# Patient Record
Sex: Male | Born: 1950
Health system: Southern US, Community
[De-identification: ages and names within clinical notes are randomized; demographics above are authoritative.]

## PROBLEM LIST (undated history)

## (undated) DIAGNOSIS — F419 Anxiety disorder, unspecified: Secondary | ICD-10-CM

## (undated) DIAGNOSIS — I10 Essential (primary) hypertension: Secondary | ICD-10-CM

## (undated) DIAGNOSIS — I2510 Atherosclerotic heart disease of native coronary artery without angina pectoris: Secondary | ICD-10-CM

## (undated) DIAGNOSIS — I639 Cerebral infarction, unspecified: Secondary | ICD-10-CM

## (undated) DIAGNOSIS — G4700 Insomnia, unspecified: Secondary | ICD-10-CM

## (undated) DIAGNOSIS — F32A Depression, unspecified: Secondary | ICD-10-CM

## (undated) DIAGNOSIS — F329 Major depressive disorder, single episode, unspecified: Secondary | ICD-10-CM

## (undated) DIAGNOSIS — N529 Male erectile dysfunction, unspecified: Secondary | ICD-10-CM

## (undated) DIAGNOSIS — K219 Gastro-esophageal reflux disease without esophagitis: Secondary | ICD-10-CM

## (undated) HISTORY — DX: Atherosclerotic heart disease of native coronary artery without angina pectoris: I25.10

## (undated) HISTORY — DX: Major depressive disorder, single episode, unspecified: F32.9

## (undated) HISTORY — DX: Depression, unspecified: F32.A

## (undated) HISTORY — DX: Male erectile dysfunction, unspecified: N52.9

## (undated) HISTORY — DX: Cerebral infarction, unspecified: I63.9

## (undated) HISTORY — DX: Essential (primary) hypertension: I10

## (undated) HISTORY — DX: Gastro-esophageal reflux disease without esophagitis: K21.9

## (undated) HISTORY — DX: Insomnia, unspecified: G47.00

## (undated) HISTORY — DX: Anxiety disorder, unspecified: F41.9

---

## 1993-12-29 HISTORY — PX: LUMBAR LAMINECTOMY: SHX95

## 1994-12-29 HISTORY — PX: CERVICAL LAMINECTOMY: SHX94

## 1999-12-30 HISTORY — PX: OTHER SURGICAL HISTORY: SHX169

## 2000-05-19 ENCOUNTER — Encounter: Payer: Self-pay | Admitting: Cardiology

## 2000-05-19 ENCOUNTER — Inpatient Hospital Stay (HOSPITAL_COMMUNITY): Admission: EM | Admit: 2000-05-19 | Discharge: 2000-05-25 | Payer: Self-pay | Admitting: *Deleted

## 2000-05-22 ENCOUNTER — Encounter: Payer: Self-pay | Admitting: Thoracic Surgery (Cardiothoracic Vascular Surgery)

## 2000-05-23 ENCOUNTER — Encounter: Payer: Self-pay | Admitting: Thoracic Surgery (Cardiothoracic Vascular Surgery)

## 2000-05-24 ENCOUNTER — Encounter: Payer: Self-pay | Admitting: Thoracic Surgery (Cardiothoracic Vascular Surgery)

## 2000-11-25 ENCOUNTER — Ambulatory Visit (HOSPITAL_COMMUNITY): Admission: RE | Admit: 2000-11-25 | Discharge: 2000-11-25 | Payer: Self-pay | Admitting: Orthopedic Surgery

## 2000-11-25 ENCOUNTER — Encounter: Payer: Self-pay | Admitting: Orthopedic Surgery

## 2000-12-16 ENCOUNTER — Encounter: Payer: Self-pay | Admitting: Orthopedic Surgery

## 2000-12-16 ENCOUNTER — Ambulatory Visit (HOSPITAL_COMMUNITY): Admission: RE | Admit: 2000-12-16 | Discharge: 2000-12-16 | Payer: Self-pay | Admitting: Orthopedic Surgery

## 2001-07-26 ENCOUNTER — Emergency Department (HOSPITAL_COMMUNITY): Admission: EM | Admit: 2001-07-26 | Discharge: 2001-07-26 | Payer: Self-pay | Admitting: Emergency Medicine

## 2001-07-26 ENCOUNTER — Encounter: Payer: Self-pay | Admitting: Emergency Medicine

## 2004-09-06 ENCOUNTER — Ambulatory Visit: Payer: Self-pay | Admitting: Internal Medicine

## 2004-09-10 ENCOUNTER — Ambulatory Visit: Payer: Self-pay | Admitting: Internal Medicine

## 2004-09-17 ENCOUNTER — Ambulatory Visit: Payer: Self-pay | Admitting: Internal Medicine

## 2004-09-20 ENCOUNTER — Ambulatory Visit: Payer: Self-pay | Admitting: Internal Medicine

## 2004-10-01 ENCOUNTER — Ambulatory Visit: Payer: Self-pay | Admitting: *Deleted

## 2004-10-01 ENCOUNTER — Ambulatory Visit: Payer: Self-pay | Admitting: Internal Medicine

## 2004-10-04 ENCOUNTER — Ambulatory Visit: Payer: Self-pay | Admitting: Internal Medicine

## 2004-10-09 ENCOUNTER — Ambulatory Visit: Payer: Self-pay | Admitting: Internal Medicine

## 2004-10-17 ENCOUNTER — Ambulatory Visit: Payer: Self-pay | Admitting: Internal Medicine

## 2004-10-28 ENCOUNTER — Ambulatory Visit: Payer: Self-pay | Admitting: Internal Medicine

## 2004-11-04 ENCOUNTER — Ambulatory Visit: Payer: Self-pay | Admitting: Internal Medicine

## 2004-11-11 ENCOUNTER — Ambulatory Visit: Payer: Self-pay | Admitting: Internal Medicine

## 2004-12-02 ENCOUNTER — Ambulatory Visit: Payer: Self-pay | Admitting: Internal Medicine

## 2004-12-16 ENCOUNTER — Ambulatory Visit: Payer: Self-pay | Admitting: Internal Medicine

## 2004-12-17 ENCOUNTER — Ambulatory Visit: Payer: Self-pay | Admitting: Internal Medicine

## 2004-12-25 ENCOUNTER — Ambulatory Visit: Payer: Self-pay | Admitting: Internal Medicine

## 2005-01-15 ENCOUNTER — Ambulatory Visit: Payer: Self-pay | Admitting: Internal Medicine

## 2005-01-29 ENCOUNTER — Ambulatory Visit: Payer: Self-pay | Admitting: Internal Medicine

## 2005-02-04 ENCOUNTER — Ambulatory Visit: Payer: Self-pay | Admitting: Internal Medicine

## 2005-03-19 ENCOUNTER — Ambulatory Visit: Payer: Self-pay | Admitting: Internal Medicine

## 2005-04-18 ENCOUNTER — Ambulatory Visit: Payer: Self-pay | Admitting: Internal Medicine

## 2005-07-18 ENCOUNTER — Ambulatory Visit: Payer: Self-pay | Admitting: Internal Medicine

## 2005-08-13 ENCOUNTER — Ambulatory Visit: Payer: Self-pay | Admitting: Family Medicine

## 2007-01-25 ENCOUNTER — Ambulatory Visit: Payer: Self-pay | Admitting: Family Medicine

## 2007-08-18 ENCOUNTER — Telehealth: Payer: Self-pay | Admitting: Family Medicine

## 2007-10-12 ENCOUNTER — Telehealth: Payer: Self-pay | Admitting: Family Medicine

## 2008-04-26 ENCOUNTER — Telehealth: Payer: Self-pay | Admitting: Family Medicine

## 2008-06-25 ENCOUNTER — Inpatient Hospital Stay (HOSPITAL_COMMUNITY): Admission: EM | Admit: 2008-06-25 | Discharge: 2008-06-28 | Payer: Self-pay | Admitting: Emergency Medicine

## 2008-06-25 ENCOUNTER — Ambulatory Visit: Payer: Self-pay | Admitting: Cardiology

## 2008-06-26 ENCOUNTER — Encounter: Payer: Self-pay | Admitting: Internal Medicine

## 2008-06-26 ENCOUNTER — Ambulatory Visit: Payer: Self-pay | Admitting: *Deleted

## 2008-06-27 ENCOUNTER — Ambulatory Visit: Payer: Self-pay | Admitting: Internal Medicine

## 2008-06-27 ENCOUNTER — Ambulatory Visit: Payer: Self-pay | Admitting: Physical Medicine & Rehabilitation

## 2008-06-28 ENCOUNTER — Inpatient Hospital Stay (HOSPITAL_COMMUNITY)
Admission: RE | Admit: 2008-06-28 | Discharge: 2008-07-07 | Payer: Self-pay | Admitting: Physical Medicine & Rehabilitation

## 2008-06-28 ENCOUNTER — Ambulatory Visit: Payer: Self-pay | Admitting: Physical Medicine & Rehabilitation

## 2008-07-12 ENCOUNTER — Encounter
Admission: RE | Admit: 2008-07-12 | Discharge: 2008-07-28 | Payer: Self-pay | Admitting: Physical Medicine & Rehabilitation

## 2008-07-18 ENCOUNTER — Telehealth: Payer: Self-pay | Admitting: Family Medicine

## 2008-07-26 ENCOUNTER — Encounter
Admission: RE | Admit: 2008-07-26 | Discharge: 2008-07-26 | Payer: Self-pay | Admitting: Physical Medicine & Rehabilitation

## 2008-07-26 ENCOUNTER — Ambulatory Visit: Payer: Self-pay | Admitting: Physical Medicine & Rehabilitation

## 2008-08-03 ENCOUNTER — Telehealth: Payer: Self-pay | Admitting: Family Medicine

## 2008-08-11 ENCOUNTER — Telehealth: Payer: Self-pay | Admitting: Family Medicine

## 2008-08-11 ENCOUNTER — Ambulatory Visit: Payer: Self-pay | Admitting: Family Medicine

## 2008-08-11 DIAGNOSIS — I252 Old myocardial infarction: Secondary | ICD-10-CM | POA: Insufficient documentation

## 2008-08-11 DIAGNOSIS — F411 Generalized anxiety disorder: Secondary | ICD-10-CM | POA: Insufficient documentation

## 2008-08-11 DIAGNOSIS — Z8679 Personal history of other diseases of the circulatory system: Secondary | ICD-10-CM | POA: Insufficient documentation

## 2008-08-11 DIAGNOSIS — K219 Gastro-esophageal reflux disease without esophagitis: Secondary | ICD-10-CM | POA: Insufficient documentation

## 2008-08-11 DIAGNOSIS — I2510 Atherosclerotic heart disease of native coronary artery without angina pectoris: Secondary | ICD-10-CM | POA: Insufficient documentation

## 2008-08-11 DIAGNOSIS — F329 Major depressive disorder, single episode, unspecified: Secondary | ICD-10-CM | POA: Insufficient documentation

## 2008-08-11 DIAGNOSIS — F17200 Nicotine dependence, unspecified, uncomplicated: Secondary | ICD-10-CM | POA: Insufficient documentation

## 2008-08-11 DIAGNOSIS — I10 Essential (primary) hypertension: Secondary | ICD-10-CM | POA: Insufficient documentation

## 2008-10-30 ENCOUNTER — Telehealth: Payer: Self-pay | Admitting: Family Medicine

## 2009-05-11 ENCOUNTER — Ambulatory Visit: Payer: Self-pay | Admitting: Family Medicine

## 2009-05-11 DIAGNOSIS — M545 Low back pain, unspecified: Secondary | ICD-10-CM | POA: Insufficient documentation

## 2009-06-04 ENCOUNTER — Telehealth: Payer: Self-pay | Admitting: Family Medicine

## 2009-06-13 ENCOUNTER — Telehealth: Payer: Self-pay | Admitting: Family Medicine

## 2009-07-13 ENCOUNTER — Telehealth: Payer: Self-pay | Admitting: Family Medicine

## 2009-08-17 ENCOUNTER — Telehealth: Payer: Self-pay | Admitting: Family Medicine

## 2009-10-08 ENCOUNTER — Telehealth: Payer: Self-pay | Admitting: Family Medicine

## 2009-10-10 ENCOUNTER — Telehealth: Payer: Self-pay | Admitting: Family Medicine

## 2009-10-10 ENCOUNTER — Ambulatory Visit: Payer: Self-pay | Admitting: Family Medicine

## 2009-10-10 DIAGNOSIS — E785 Hyperlipidemia, unspecified: Secondary | ICD-10-CM | POA: Insufficient documentation

## 2009-10-11 ENCOUNTER — Telehealth: Payer: Self-pay | Admitting: Family Medicine

## 2009-10-15 LAB — CONVERTED CEMR LAB
ALT: 20 units/L (ref 0–53)
AST: 27 units/L (ref 0–37)
Albumin: 4.6 g/dL (ref 3.5–5.2)
Alkaline Phosphatase: 49 units/L (ref 39–117)
BUN: 15 mg/dL (ref 6–23)
Basophils Absolute: 0 10*3/uL (ref 0.0–0.1)
Basophils Relative: 0.4 % (ref 0.0–3.0)
Bilirubin, Direct: 0.1 mg/dL (ref 0.0–0.3)
CO2: 31 meq/L (ref 19–32)
Calcium: 9.8 mg/dL (ref 8.4–10.5)
Chloride: 102 meq/L (ref 96–112)
Cholesterol: 165 mg/dL (ref 0–200)
Creatinine, Ser: 1.1 mg/dL (ref 0.4–1.5)
Eosinophils Absolute: 0.2 10*3/uL (ref 0.0–0.7)
Eosinophils Relative: 2.5 % (ref 0.0–5.0)
GFR calc non Af Amer: 72.42 mL/min (ref >60)
Glucose, Bld: 116 mg/dL — ABNORMAL HIGH (ref 70–99)
HCT: 44 % (ref 39.0–52.0)
HDL: 28.6 mg/dL — ABNORMAL LOW (ref >39.00)
Hemoglobin: 15.4 g/dL (ref 13.0–17.0)
LDL Cholesterol: 101 mg/dL — ABNORMAL HIGH (ref 0–99)
Lymphocytes Relative: 30.3 % (ref 12.0–46.0)
Lymphs Abs: 2.7 10*3/uL (ref 0.7–4.0)
MCHC: 35 g/dL (ref 30.0–36.0)
MCV: 93.5 fL (ref 78.0–100.0)
Monocytes Absolute: 0.8 10*3/uL (ref 0.1–1.0)
Monocytes Relative: 9.1 % (ref 3.0–12.0)
Neutro Abs: 5.1 10*3/uL (ref 1.4–7.7)
Neutrophils Relative %: 57.7 % (ref 43.0–77.0)
Platelets: 310 10*3/uL (ref 150.0–400.0)
Potassium: 4.4 meq/L (ref 3.5–5.1)
RBC: 4.7 M/uL (ref 4.22–5.81)
RDW: 12.2 % (ref 11.5–14.6)
Sodium: 148 meq/L — ABNORMAL HIGH (ref 135–145)
TSH: 1.01 microintl units/mL (ref 0.35–5.50)
Total Bilirubin: 0.6 mg/dL (ref 0.3–1.2)
Total CHOL/HDL Ratio: 6
Total Protein: 7.5 g/dL (ref 6.0–8.3)
Triglycerides: 179 mg/dL — ABNORMAL HIGH (ref 0.0–149.0)
VLDL: 35.8 mg/dL (ref 0.0–40.0)
WBC: 8.8 10*3/uL (ref 4.5–10.5)

## 2009-11-13 ENCOUNTER — Telehealth: Payer: Self-pay | Admitting: Family Medicine

## 2009-12-14 ENCOUNTER — Telehealth: Payer: Self-pay | Admitting: Family Medicine

## 2009-12-31 ENCOUNTER — Telehealth: Payer: Self-pay | Admitting: Family Medicine

## 2010-01-15 ENCOUNTER — Telehealth: Payer: Self-pay | Admitting: Family Medicine

## 2010-02-26 ENCOUNTER — Telehealth: Payer: Self-pay | Admitting: Family Medicine

## 2010-04-10 ENCOUNTER — Telehealth: Payer: Self-pay | Admitting: Family Medicine

## 2010-05-17 ENCOUNTER — Telehealth: Payer: Self-pay | Admitting: Family Medicine

## 2010-06-18 ENCOUNTER — Telehealth: Payer: Self-pay | Admitting: Family Medicine

## 2010-08-19 ENCOUNTER — Ambulatory Visit: Payer: Self-pay | Admitting: Family Medicine

## 2010-08-19 DIAGNOSIS — R1084 Generalized abdominal pain: Secondary | ICD-10-CM

## 2010-08-19 DIAGNOSIS — R3129 Other microscopic hematuria: Secondary | ICD-10-CM | POA: Insufficient documentation

## 2010-08-19 DIAGNOSIS — K5900 Constipation, unspecified: Secondary | ICD-10-CM | POA: Insufficient documentation

## 2010-08-19 DIAGNOSIS — R634 Abnormal weight loss: Secondary | ICD-10-CM | POA: Insufficient documentation

## 2010-08-21 ENCOUNTER — Ambulatory Visit: Payer: Self-pay | Admitting: Internal Medicine

## 2010-08-21 LAB — CONVERTED CEMR LAB
ALT: 19 units/L (ref 0–53)
AST: 22 units/L (ref 0–37)
Albumin: 4.4 g/dL (ref 3.5–5.2)
Alkaline Phosphatase: 61 units/L (ref 39–117)
Amylase: 41 units/L (ref 27–131)
BUN: 16 mg/dL (ref 6–23)
Basophils Absolute: 0.1 10*3/uL (ref 0.0–0.1)
Basophils Relative: 0.7 % (ref 0.0–3.0)
Bilirubin, Direct: 0.1 mg/dL (ref 0.0–0.3)
CEA: 2 ng/mL (ref 0.0–5.0)
CO2: 33 meq/L — ABNORMAL HIGH (ref 19–32)
Calcium: 9.8 mg/dL (ref 8.4–10.5)
Chloride: 96 meq/L (ref 96–112)
Creatinine, Ser: 1.3 mg/dL (ref 0.4–1.5)
Eosinophils Absolute: 0.3 10*3/uL (ref 0.0–0.7)
Eosinophils Relative: 2.7 % (ref 0.0–5.0)
GFR calc non Af Amer: 60.63 mL/min (ref >60)
Glucose, Bld: 125 mg/dL — ABNORMAL HIGH (ref 70–99)
HCT: 44.2 % (ref 39.0–52.0)
Hemoglobin: 15.3 g/dL (ref 13.0–17.0)
Lymphocytes Relative: 28 % (ref 12.0–46.0)
Lymphs Abs: 3 10*3/uL (ref 0.7–4.0)
MCHC: 34.7 g/dL (ref 30.0–36.0)
MCV: 91.8 fL (ref 78.0–100.0)
Monocytes Absolute: 0.8 10*3/uL (ref 0.1–1.0)
Monocytes Relative: 8 % (ref 3.0–12.0)
Neutro Abs: 6.4 10*3/uL (ref 1.4–7.7)
Neutrophils Relative %: 60.6 % (ref 43.0–77.0)
PSA: 0.8 ng/mL (ref 0.10–4.00)
Platelets: 362 10*3/uL (ref 150.0–400.0)
Potassium: 4.1 meq/L (ref 3.5–5.1)
RBC: 4.82 M/uL (ref 4.22–5.81)
RDW: 13.4 % (ref 11.5–14.6)
Sodium: 139 meq/L (ref 135–145)
TSH: 1.02 microintl units/mL (ref 0.35–5.50)
Total Bilirubin: 0.6 mg/dL (ref 0.3–1.2)
Total Protein: 7.2 g/dL (ref 6.0–8.3)
WBC: 10.6 10*3/uL — ABNORMAL HIGH (ref 4.5–10.5)

## 2010-08-22 ENCOUNTER — Encounter: Payer: Self-pay | Admitting: Physician Assistant

## 2010-08-22 ENCOUNTER — Encounter: Payer: Self-pay | Admitting: Family Medicine

## 2010-08-22 ENCOUNTER — Ambulatory Visit: Payer: Self-pay | Admitting: Internal Medicine

## 2010-08-22 ENCOUNTER — Telehealth (INDEPENDENT_AMBULATORY_CARE_PROVIDER_SITE_OTHER): Payer: Self-pay | Admitting: *Deleted

## 2010-08-22 DIAGNOSIS — I6350 Cerebral infarction due to unspecified occlusion or stenosis of unspecified cerebral artery: Secondary | ICD-10-CM | POA: Insufficient documentation

## 2010-08-22 DIAGNOSIS — R933 Abnormal findings on diagnostic imaging of other parts of digestive tract: Secondary | ICD-10-CM

## 2010-09-04 ENCOUNTER — Ambulatory Visit (HOSPITAL_COMMUNITY): Admission: RE | Admit: 2010-09-04 | Discharge: 2010-09-04 | Payer: Self-pay | Admitting: Internal Medicine

## 2010-09-04 ENCOUNTER — Ambulatory Visit: Payer: Self-pay | Admitting: Internal Medicine

## 2010-09-04 LAB — HM COLONOSCOPY

## 2010-09-09 ENCOUNTER — Encounter: Payer: Self-pay | Admitting: Internal Medicine

## 2011-01-28 NOTE — Progress Notes (Signed)
Summary: refill xanax  Phone Note From Pharmacy   Caller: [facility] [address] [phone]* Call For: fry  Summary of Call: refill xanax 0.5mg  1 by mouth 4 times a day Initial call taken by: Alfred Levins, CMA,  October 30, 2008 7:59 AM  Follow-up for Phone Call        call in #120 with 5 rf Follow-up by: Nelwyn Salisbury MD,  October 30, 2008 1:15 PM  Additional Follow-up for Phone Call Additional follow up Details #1::        Phone call completed, Pharmacist called Additional Follow-up by: Alfred Levins, CMA,  October 31, 2008 8:40 AM    New/Updated Medications: ALPRAZOLAM 0.5 MG  TB24 (ALPRAZOLAM) 1 every 6 hours   Prescriptions: ALPRAZOLAM 0.5 MG  TB24 (ALPRAZOLAM) 1 every 6 hours  #120 x 5   Entered by:   Alfred Levins, CMA   Authorized by:   Nelwyn Salisbury MD   Signed by:   Alfred Levins, CMA on 10/31/2008   Method used:   Telephoned to ...       [facility] [address] [address] [phone]* (retail)       [address]       [place], [place]  [address]       Ph: [phone]       Fax: [phone]   RxID:   [id number]

## 2011-01-28 NOTE — Assessment & Plan Note (Signed)
Summary: personal/njr   Vital Signs:  Patient profile:   60 year old male Weight:      175 pounds BMI:     23.82 BP sitting:   150 / 92  (left arm) Cuff size:   regular  Vitals Entered By: Raechel Ache, RN (August 19, 2010 1:24 PM) CC: C/o terrible constipation.   History of Present Illness: Here complaining of severe constipation that started rather suddenly 3 weeks ago. He never had trouble with this before, andhe usually had a BM every day. Then 3 weeks ago he developed intermittent lower abdominal pains and it became dificult to have a BM. Now he is having only one or 2 BMs a week, and these are difficult to pass. The stools are small and hard, and he has to strain quite a bit to pass them. He still has intermittent pains in both lower quadrants. No bllod in the stools. No fever. No nausea or vomitting. His appetite is down somewhat, and he has lost 8 lbs since he was here last October. He has never had a colonoscopy despite my advising him to do so. He urinates easily. He stopped using Percocets over a month ago, so I cannot chalk this up to medication side effects.   Allergies: No Known Drug Allergies  Past History:  Past Medical History: Reviewed history from 08/11/2008 and no changes required. Anxiety insomnia Hypertension Cerebrovascular accident, hx of, on 06-17-08 GERD ED Depression, saw Dr. Donell Beers in the past Coronary artery disease Myocardial infarction, hx of, 2001  Past Surgical History: Reviewed history from 05/11/2009 and no changes required. Lumbar laminectomy 1995, per Dr. Darrelyn Hillock Cervical laminectomy 1996, per Dr. Gerlene Fee  Family History: Reviewed history from 08/11/2008 and no changes required. Family History of CAD Male 1st degree relative <50 Family History Hypertension  Review of Systems  The patient denies anorexia, fever, weight gain, vision loss, decreased hearing, hoarseness, chest pain, syncope, dyspnea on exertion, peripheral edema,  prolonged cough, headaches, hemoptysis, melena, hematochezia, severe indigestion/heartburn, hematuria, incontinence, genital sores, muscle weakness, suspicious skin lesions, transient blindness, difficulty walking, depression, unusual weight change, abnormal bleeding, enlarged lymph nodes, angioedema, breast masses, and testicular masses.    Physical Exam  General:  alert, thin, NAD Lungs:  Normal respiratory effort, chest expands symmetrically. Lungs are clear to auscultation, no crackles or wheezes. Heart:  Normal rate and regular rhythm. S1 and S2 normal without gallop, murmur, click, rub or other extra sounds. Abdomen:  soft, normal bowel sounds, no distention, no masses, no guarding, no rigidity, no rebound tenderness, no abdominal hernia, no inguinal hernia, no hepatomegaly, and no splenomegaly.  Mildly tender in both lower quadrants Rectal:  no external abnormalities, no hemorrhoids, normal sphincter tone, and no masses.  The rectum is full of soft heme negative brown stool Prostate:  Prostate gland firm and smooth, no enlargement, nodularity, tenderness, mass, asymmetry or induration.   Impression & Recommendations:  Problem # 1:  ABDOMINAL PAIN, GENERALIZED (ICD-789.07)  Orders: UA Dipstick w/o Micro (automated)  (81003) Venipuncture ([address]) TLB-BMP (Basic Metabolic Panel-BMET) (80048-METABOL) TLB-CBC Platelet - w/Differential (85025-CBCD) TLB-Hepatic/Liver Function Pnl (80076-HEPATIC) TLB-TSH (Thyroid Stimulating Hormone) (84443-TSH) TLB-Amylase (82150-AMYL) TLB-CEA (Carcinoembryonic Antigen) (82378-CEA) TLB-PSA (Prostate Specific Antigen) (60454-UJW) Radiology Referral (Radiology)  Complete Medication List: 1)  Alprazolam 0.5 Mg Tb24 (Alprazolam) .Marland Kitchen.. 1 every 6 hours 2)  Zocor 40 Mg Tabs (Simvastatin) .Marland Kitchen.. 1 at bedtime 3)  Lopressor 50 Mg Tabs (Metoprolol tartrate) .Marland Kitchen.. 1 1/2 two times a day 4)  Hydrochlorothiazide 25 Mg  Tabs (Hydrochlorothiazide) .Marland Kitchen.. 1 once daily 5)   Fenofibrate 160 Mg Tabs (Fenofibrate) .Marland Kitchen.. 1 by mouth once daily 6)  Plavix 75 Mg Tabs (Clopidogrel bisulfate) .Marland Kitchen.. 1 once daily 7)  Bayer Low Strength 81 Mg Tbec (Aspirin) .... Once daily 8)  Percocet 10-650 Mg Tabs (Oxycodone-acetaminophen) .Marland Kitchen.. 1 q 6 hours as needed pain  Patient Instructions: 1)  Suggested he try some Fleet enemas with mineral oil tonight. Get labs. Set up a contrasted CT of abdomen and pelvis. He will need a colonoscopy some time soon as well.   Appended Document: personal/njr  Laboratory Results   Urine Tests    Routine Urinalysis   Color: yellow Appearance: Clear Glucose: negative   (Normal Range: Negative) Bilirubin: negative   (Normal Range: Negative) Ketone: negative   (Normal Range: Negative) Spec. Gravity: 1.020   (Normal Range: 1.003-1.035) Blood: 1+   (Normal Range: Negative) pH: 7.0   (Normal Range: 5.0-8.0) Protein: 2+   (Normal Range: Negative) Urobilinogen: 0.2   (Normal Range: 0-1) Nitrite: negative   (Normal Range: Negative) Leukocyte Esterace: negative   (Normal Range: Negative)    Comments: Rita Ohara  August 19, 2010 3:10 PM      Appended Document: personal/njr his urine has a little blood in it. refer to Urology for microscopic hematuria   Appended Document: personal/njr called.   Clinical Lists Changes  Problems: Added new problem of MICROSCOPIC HEMATURIA (ICD-599.72) Orders: Added new Referral order of Urology Referral (Urology) - Signed

## 2011-01-28 NOTE — Letter (Signed)
Summary: Anticoagulation Modification Letter  [facility]  [address] [place], [place] [address]   Phone: [phone]  Fax: [phone]            August 22, 2010  Re:    Wayne Williams DOB:    22-Feb-1951 MRN:    [id number]    Dear Dr Claris Che:  We have scheduled the above patient for an endoscopic procedure. Our records show that  he is on anticoagulation therapy. Please advise as to how long the patient may come off their therapy of Plavix prior to the scheduled procedure(s) on 09/04/10.   Please fax back/or route the completed form to [employer] at [phone]. Thank you for your help with this matter.  Sincerely,   Dottie Nelson-Smith CMA (AAMA)   Physician Recommendation:  Hold Plavix 7 days prior ________________   Other ______________________________     Appended Document: Anticoagulation Modification Letter okay to hold Plavix for 7 days prior to procedure  Appended Document: Anticoagulation Modification Letter Patient advised okay per Dr Clent Ridges that it is okay to discontinue plavix 7 days prior to test. Patient verbalizes understanding.

## 2011-01-28 NOTE — Progress Notes (Signed)
Summary: refill xanax  Phone Note Refill Request Message from:  Pharmacy on December 31, 2009 11:55 AM  Refills Requested: Medication #1:  ALPRAZOLAM 0.5 MG  TB24 1 every 6 hours [facility]   Method Requested: Electronic Initial call taken by: Alfred Levins, CMA,  December 31, 2009 11:55 AM  Follow-up for Phone Call        call in #120 with 5 rf Follow-up by: Nelwyn Salisbury MD,  January 01, 2010 8:13 AM  Additional Follow-up for Phone Call Additional follow up Details #1::        Rx called to pharmacy Additional Follow-up by: Alfred Levins, CMA,  January 01, 2010 10:38 AM    Prescriptions: ALPRAZOLAM 0.5 MG  TB24 (ALPRAZOLAM) 1 every 6 hours  #120 x 5   Entered by:   Alfred Levins, CMA   Authorized by:   Nelwyn Salisbury MD   Signed by:   Alfred Levins, CMA on 01/01/2010   Method used:   Telephoned to ...       [facility] [address] (retail)       [phone]       [place], [place]  [address]       Ph: [phone]       Fax: [phone]   RxID:   [id number]

## 2011-01-28 NOTE — Miscellaneous (Signed)
Summary: Orders Update   Clinical Lists Changes  Orders: Added new Referral order of Gastroenterology Referral (GI) - Signed 

## 2011-01-28 NOTE — Progress Notes (Signed)
Summary: percocet  Phone Note Call from Patient Call back at [phone]   Caller: live Call For: fry Summary of Call: Requesting refill Percocet.  Pain continues butt cheek & behind knee.  Still using 1 -2 daily.  Prednisone helped & getting better, but would like to have the Percocet to take.  [facility] [address].   Initial call taken by: Rudy Jew, RN,  June 13, 2009 2:57 PM  Follow-up for Phone Call        done Follow-up by: Nelwyn Salisbury MD,  June 13, 2009 3:25 PM  Additional Follow-up for Phone Call Additional follow up Details #1::        rx up front, pt aware Additional Follow-up by: Alfred Levins, CMA,  June 13, 2009 3:32 PM      Prescriptions: PERCOCET 10-650 MG TABS (OXYCODONE-ACETAMINOPHEN) 1 q 6 hours as needed pain  #60 x 0   Entered and Authorized by:   Nelwyn Salisbury MD   Signed by:   Nelwyn Salisbury MD on 06/13/2009   Method used:   Print then Give to Patient   RxID:   [id number]

## 2011-01-28 NOTE — Progress Notes (Signed)
Summary: refill xanax  Phone Note Call from Patient Call back at Home Phone [phone]   Caller: Patient Call For: fry Summary of Call: [facility]anax [facility] [address] Initial call taken by: Alfred Levins, CMA,  April 26, 2008 9:44 AM  Follow-up for Phone Call        call in 0.5mg  4 times a day, #120 with 5 rf Follow-up by: Nelwyn Salisbury MD,  April 27, 2008 10:21 AM  Additional Follow-up for Phone Call Additional follow up Details #1::        Phone Call Completed, Rx Called In Additional Follow-up by: Alfred Levins, CMA,  April 27, 2008 11:27 AM      Prescriptions: ALPRAZOLAM 0.5 MG  TB24 (ALPRAZOLAM) #120  #120 x 5   Entered by:   Alfred Levins, CMA   Authorized by:   Nelwyn Salisbury MD   Signed by:   Alfred Levins, CMA on 04/27/2008   Method used:   Telephoned to ...       [facility] [address] [phone]*       [address]       [place], [place]  [address]       Ph: [phone]       Fax: [phone]   RxID:   [id number]

## 2011-01-28 NOTE — Progress Notes (Signed)
Summary: refill percocet  Phone Note Call from Patient Call back at Home Phone [phone]   Caller: Patient Call For: fry Reason for Call: Refill Medication Summary of Call: refill percocet call when ready for p/u Initial call taken by: Alfred Levins, CMA,  November 13, 2009 1:58 PM  Follow-up for Phone Call        done Follow-up by: Nelwyn Salisbury MD,  November 13, 2009 4:59 PM  Additional Follow-up for Phone Call Additional follow up Details #1::        rx up front ready for p/u, pt aware Additional Follow-up by: Alfred Levins, CMA,  November 13, 2009 5:08 PM    Prescriptions: PERCOCET 10-650 MG TABS (OXYCODONE-ACETAMINOPHEN) 1 q 6 hours as needed pain  #120 x 0   Entered and Authorized by:   Nelwyn Salisbury MD   Signed by:   Nelwyn Salisbury MD on 11/13/2009   Method used:   Print then Give to Patient   RxID:   [id number]

## 2011-01-28 NOTE — Procedures (Signed)
Summary: Colonoscopy  Patient: Aseel Uhde Note: All result statuses are Final unless otherwise noted.  Tests: (1) Colonoscopy (COL)   COL Colonoscopy           DONE (C)     [facility]     [address] [place], [place]  [address]           COLONOSCOPY PROCEDURE REPORT           PATIENT:  Devun, Anna  MR#:  [id number]     BIRTHDATE:  April 04, 1951, 59 yrs. old  GENDER:  male     ENDOSCOPIST:  Iva Boop, MD, [facility]     REF. BY:  Tera Mater. Clent Ridges, M.D.     PROCEDURE DATE:  09/04/2010     PROCEDURE:  Colonoscopy with snare polypectomy     ASA CLASS:  Class III     INDICATIONS:  change in bowel habits, Abnormal CT of abdomen new     constipation, rectosigmoid thickening on CT     MEDICATIONS:   Fentanyl 150 mcg IV, Versed 13 mg IV, Benadryl 25     mg IV           DESCRIPTION OF PROCEDURE:   After the risks benefits and     alternatives of the procedure were thoroughly explained, informed     consent was obtained.  Digital rectal exam was performed and     revealed no abnormalities and normal prostate.   The  endoscope     was introduced through the anus and advanced to the cecum, which     was identified by both the appendix and ileocecal valve, without     limitations.  The quality of the prep was good, using MoviPrep.     The instrument was then slowly withdrawn as the colon was fully     examined. Insertion: 6:20 minutes Withdrawal: 10 minutes     <<PROCEDUREIMAGES>>           FINDINGS:  Moderate diverticulosis was found in the sigmoid colon.     A diminutive polyp was found. It was 4 mm in size. Polyp was     snared without cautery. Retrieval was successful. This was     otherwise a normal examination of the colon.   Retroflexed views     in the rectum revealed no abnormalities.    The scope was then     withdrawn from the patient and the procedure completed.           COMPLICATIONS:  None     ENDOSCOPIC IMPRESSION:     1) 4 mm diminutive polyp - removed    2) Moderate diverticulosis in the sigmoid colon     3) Otherwise normal examination     RECOMMENDATIONS:     Try MiraLax 2-4 times a day for constipation. Use Fleet's enema     or Dulcolax pills every 2-3 days to promote a bowel movement if     needed.     REPEAT EXAM:  In for  Colonoscopy, pending biopsy results.           Iva Boop, MD, [facility]           CC:  Nelwyn Salisbury, MD     The Patient           n.     REVISED:  09/05/2010 12:07 PM     eSIGNED:   Iva Boop at  09/05/2010 12:07 PM           Leonard Downing, [id number]  Note: An exclamation mark (!) indicates a result that was not dispersed into the flowsheet. Document Creation Date: 09/05/2010 12:08 PM _______________________________________________________________________  (1) Order result status: Final Collection or observation date-time: 09/04/2010 09:49 Requested date-time:  Receipt date-time:  Reported date-time:  Referring Physician:   Ordering Physician: Stan Head [phone]) Specimen Source:  Source: Launa Grill Order Number: [id number] Lab site:

## 2011-01-28 NOTE — Progress Notes (Signed)
Summary: refill Percocet  Phone Note Call from Patient Call back at Home Phone [phone]   Caller: Patient Call For: Nelwyn Salisbury MD Reason for Call: Refill Medication Summary of Call: refill Percocet Initial call taken by: Raechel Ache, RN,  May 17, 2010 8:18 AM  Follow-up for Phone Call        done Follow-up by: Nelwyn Salisbury MD,  May 17, 2010 8:38 AM    Prescriptions: PERCOCET 10-650 MG TABS (OXYCODONE-ACETAMINOPHEN) 1 q 6 hours as needed pain  #120 x 0   Entered and Authorized by:   Nelwyn Salisbury MD   Signed by:   Nelwyn Salisbury MD on 05/17/2010   Method used:   Print then Give to Patient   RxID:   [id number]

## 2011-01-28 NOTE — Progress Notes (Signed)
Summary: refill Percocet  Phone Note Call from Patient   Caller: Patient Call For: Nelwyn Salisbury MD Summary of Call: Pt is calling for refill on Percocet due to continued back pain. Nicolette Bang [facility]) [phone] Initial call taken by: Lynann Beaver CMA,  October 08, 2009 9:56 AM  Follow-up for Phone Call        done Follow-up by: Nelwyn Salisbury MD,  October 08, 2009 1:13 PM  Additional Follow-up for Phone Call Additional follow up Details #1::        rx up front ready for p/u, pt aware Additional Follow-up by: Alfred Levins, CMA,  October 08, 2009 1:15 PM    Prescriptions: PERCOCET 10-650 MG TABS (OXYCODONE-ACETAMINOPHEN) 1 q 6 hours as needed pain  #120 x 0   Entered and Authorized by:   Nelwyn Salisbury MD   Signed by:   Nelwyn Salisbury MD on 10/08/2009   Method used:   Print then Give to Patient   RxID:   [id number]

## 2011-01-28 NOTE — Progress Notes (Signed)
Summary: refill percocet  Phone Note Call from Patient Call back at Home Phone [phone]   Caller: Patient Call For: Hamilton Marinello Summary of Call: refill percocet call when ready for p/u Initial call taken by: Alfred Levins, CMA,  January 15, 2010 10:50 AM  Follow-up for Phone Call        pt called again Follow-up by: Alfred Levins, CMA,  January 16, 2010 11:57 AM  Additional Follow-up for Phone Call Additional follow up Details #1::        done Additional Follow-up by: Nelwyn Salisbury MD,  January 16, 2010 1:23 PM    Additional Follow-up for Phone Call Additional follow up Details #2::    rx up front, pt aware Follow-up by: Alfred Levins, CMA,  January 16, 2010 1:24 PM  Prescriptions: PERCOCET 10-650 MG TABS (OXYCODONE-ACETAMINOPHEN) 1 q 6 hours as needed pain  #120 x 0   Entered and Authorized by:   Nelwyn Salisbury MD   Signed by:   Nelwyn Salisbury MD on 01/16/2010   Method used:   Print then Give to Patient   RxID:   [id number]

## 2011-01-28 NOTE — Progress Notes (Signed)
Summary: refill  Phone Note Call from Patient Call back at Home Phone [phone]   Caller: Patient Summary of Call: needs refill on xanax with refills [facility] Initial call taken by: Alfred Levins, CMA,  October 12, 2007 10:38 AM  Follow-up for Phone Call        call in Xanax 0.5 mg every 6 hours as needed anxiety, #120 with 5 rf Follow-up by: Nelwyn Salisbury MD,  October 12, 2007 5:50 PM  Additional Follow-up for Phone Call Additional follow up Details #1::        Phone Call Completed, Rx Called In Additional Follow-up by: Alfred Levins, CMA,  October 13, 2007 8:08 AM

## 2011-01-28 NOTE — Progress Notes (Signed)
Summary: made appt       Additional Follow-up for Phone Call Additional follow up Details #2::    made appt for Wayne Williams for his cpx on 8-14. Follow-up by: Celine Ahr,  August 03, 2008 11:21 AM

## 2011-01-28 NOTE — Progress Notes (Signed)
Summary: refill Percocet  Phone Note Call from Patient   Caller: Patient Call For: Nelwyn Salisbury MD Reason for Call: Refill Medication Summary of Call: refill Percocet Initial call taken by: VM  Follow-up for Phone Call        done Follow-up by: Nelwyn Salisbury MD,  June 18, 2010 12:52 PM    Prescriptions: PERCOCET 10-650 MG TABS (OXYCODONE-ACETAMINOPHEN) 1 q 6 hours as needed pain  #120 x 0   Entered and Authorized by:   Nelwyn Salisbury MD   Signed by:   Nelwyn Salisbury MD on 06/18/2010   Method used:   Print then Give to Patient   RxID:   [id number]

## 2011-01-28 NOTE — Progress Notes (Signed)
Summary: refill denied  Phone Note Call from Patient Call back at Home Phone [phone]   Caller: Patient Call For: dr fry Reason for Call: Talk to Nurse Summary of Call: rx was denied generic xanax [facility] Initial call taken by: Heron Sabins,  August 18, 2007 9:46 AM  Follow-up for Phone Call        was sent electronically and declined with request to fax Told pt I would just call it in with Dr Claris Che approval Follow-up by: Alfred Levins, CMA,  August 18, 2007 11:34 AM  Additional Follow-up for Phone Call Additional follow up Details #1::        please call this in Additional Follow-up by: Nelwyn Salisbury MD,  August 18, 2007 1:16 PM    Additional Follow-up for Phone Call Additional follow up Details #2::    CALLED IN AND SPOKE W/PHARMACIST Follow-up by: Barnie Mort,  August 19, 2007 12:31 PM

## 2011-01-28 NOTE — Progress Notes (Signed)
Summary: need to create a 30 min slott  Phone Note Call from Patient Call back at [phone]   Caller: patient live Call For: Clent Ridges Summary of Call: Patient had a strok and he wants a 30 minute appt with you.  He wants it as soon as we can create one. Initial call taken by: Celine Ahr,  July 18, 2008 12:13 PM  Follow-up for Phone Call        ok but not on Monday  Follow-up by: Alfred Levins, CMA,  July 18, 2008 1:44 PM

## 2011-01-28 NOTE — Progress Notes (Signed)
Summary: percocet request  Phone Note Call from Patient Call back at [phone]   Caller: live Call For: fry Summary of Call: Re back problem, Back still hurts, low back pain, left hip & knee pain.  Request refill of the percocet.  Using several Tylenol daily with no relief.  Doesn't get relief with anything else.  [facility]. Initial call taken by: Rudy Jew, RN,  August 17, 2009 1:04 PM  Follow-up for Phone Call        done  Follow-up by: Nelwyn Salisbury MD,  August 17, 2009 4:41 PM  Additional Follow-up for Phone Call Additional follow up Details #1::        rx up front, pt aware Additional Follow-up by: Alfred Levins, CMA,  August 17, 2009 4:54 PM    Prescriptions: PERCOCET 10-650 MG TABS (OXYCODONE-ACETAMINOPHEN) 1 q 6 hours as needed pain  #120 x 0   Entered and Authorized by:   Nelwyn Salisbury MD   Signed by:   Nelwyn Salisbury MD on 08/17/2009   Method used:   Print then Give to Patient   RxID:   [id number]

## 2011-01-28 NOTE — Assessment & Plan Note (Signed)
Summary: abnormal C.T./severe constipation   History of Present Illness Visit Type: consult  Primary GI MD: Stan Head MD [facility] Primary Tania Perrott: Gershon Crane, MD  Requesting Izora Benn: Gershon Crane, MD  Chief Complaint: Abnormal CT and severe constipation, rectal pain, change in bowel habits, and GERD  History of Present Illness:   60 YO MALE ,NEW TO GI TODAY. HE HAS HX OF CAD,PRIOR CVA,ON CHRONIC PLAVIX.HE WAS SEEN RECENTLY BY DR Clent Ridges WITH C/O NEW ONSET CONSTIPATION OVER THE PAST 3 WEEKS,PRIOR TO THAT HAD ALWAYS BEEN REGULAR. ALSO NOTED 8 POUND WEIGHT LOSS PAST 8 MONTHS. PT REPORTS INABILITY TO HAVE A BM. HE TRIED X LAX -NO HELP ,THEN MINERAL OIL ENEMAS EARLIER THIS WEEK WITH SMALL BM'S. HE SAYS HE GETS  URGE FOR BM  THEN WHEN GETS TO THE BR,HAS CRAMY LOWER ABDOMINAL PAIN,BUT NO BM. HAS NOT SEEN ANY MELENA OR HEME. APPETITE FAIR, NO N/V ETC. NO NEW MEDS, NO CURRENT PAIN MEDS.   CT ABD/PELVIS  08/21/10 SHOWS AN ABNORMALLY FUNCTIONING RIGHT KIDNEY?INTRINSIC RENAL DISEASE VS RENAL ARTERY STENOSIS,ALSO FOCAL WALL THICKENING  OF RECTOSIGMOID COLON(WALL 1.4CM). LABS 8/22;WBC 10.6,HGB15.3,CEA2.0,LFTS WNL.   GI Review of Systems    Reports abdominal pain and  weight loss.     Location of  Abdominal pain: lower abdomen. Weight loss of 8 pounds   Denies acid reflux, belching, bloating, chest pain, dysphagia with liquids, dysphagia with solids, heartburn, loss of appetite, nausea, vomiting, and  vomiting blood.      Reports change in bowel habits and  constipation.     Denies anal fissure, black tarry stools, diverticulosis, fecal incontinence, heme positive stool, hemorrhoids, irritable bowel syndrome, jaundice, light color stool, liver problems, rectal bleeding, and  rectal pain.    Current Medications (verified): 1)  Alprazolam 0.5 Mg  Tb24 (Alprazolam) .Marland Kitchen.. 1 Every 6 Hours 2)  Zocor 40 Mg  Tabs (Simvastatin) .Marland Kitchen.. 1 At Bedtime 3)  Lopressor 50 Mg  Tabs (Metoprolol Tartrate) .Marland Kitchen.. 1 1/2 Two Times A  Day 4)  Hydrochlorothiazide 25 Mg  Tabs (Hydrochlorothiazide) .Marland Kitchen.. 1 Once Daily 5)  Fenofibrate 160 Mg Tabs (Fenofibrate) .Marland Kitchen.. 1 By Mouth Once Daily 6)  Plavix 75 Mg  Tabs (Clopidogrel Bisulfate) .Marland Kitchen.. 1 Once Daily 7)  Bayer Low Strength 81 Mg  Tbec (Aspirin) .... Once Daily  Allergies (verified): No Known Drug Allergies  Past History:  Past Medical History: Anxiety insomnia Hypertension Cerebrovascular accident, hx of  on 06-17-08 GERD ED Depression, saw Dr. Donell Beers in the past Coronary artery disease-S/P CABG X 4 Myocardial infarction, hx of, 2001  Past Surgical History: Lumbar laminectomy 1995, per Dr. Darrelyn Hillock Cervical laminectomy 1996, per Dr. Gerlene Fee Heart Bypass---2001  Family History: Family History of CAD Male 1st degree relative <50 Family History Hypertension No FH of Colon Cancer:  Social History: Disabled  Divorced,lives alone Current Smoker Alcohol use-yes  Review of Systems       The patient complains of allergy/sinus, back pain, and blood in urine.  The patient denies anemia, anxiety-new, arthritis/joint pain, breast changes/lumps, change in vision, confusion, cough, coughing up blood, depression-new, fainting, fatigue, fever, headaches-new, hearing problems, heart murmur, heart rhythm changes, itching, muscle pains/cramps, night sweats, nosebleeds, shortness of breath, skin rash, sleeping problems, sore throat, swelling of feet/legs, swollen lymph glands, thirst - excessive, urination - excessive, urination changes/pain, urine leakage, vision changes, and voice change.         see hpi  Vital Signs:  Patient profile:   60 year old male Height:  72 inches Weight:      173 pounds BMI:     23.55 BSA:     2.00 Temp:     99.6 degrees F oral Pulse rate:   100 / minute Pulse rhythm:   regular BP sitting:   138 / 84  (left arm) Cuff size:   regular  Vitals Entered By: Ok Anis CMA (August 22, 2010 1:59 PM)  Physical Exam  General:  Well  developed, well nourished, no acute distress.,thin Head:  Normocephalic and atraumatic. Eyes:  PERRLA, no icterus. Lungs:  Clear throughout to auscultation. Heart:  Regular rate and rhythm; no murmurs, rubs,  or bruits.,sternal scar Abdomen:  SOFT, NO PALP MASS OR HSM,BS+,NO BRUIT HEARD,MILD TENDERNESS LOWER ABDOMEN Rectal:  NOT REPEATED ,HEME NEGATIVE 8/22,NO LESION FLT-DR. FRY Neurologic:  Alert and  oriented x4;  grossly normal neurologically. Psych:  depressed affect and anxious.  PT SAYS HE SPENDS MOST OF HIS TIME ALONE AND ASKS IF I THINK HE SEEMS OK MENTALLY, AND IS HE ASKING THE RIGHT QUESTIONS.   Impression & Recommendations:  Problem # 1:  ABNORMAL FINDINGS GI TRACT (ICD-793.4) Assessment New 60 YO MALE WITH NEW ONSET CONSTIPATION X 3-WEEKS,AND ABNORMAL CT SCAN SUGGESTING POSSIBLE RECTOSIGMOID LESION. R/O COLON CA.  START MIRALAX 17 GM DAILY IN 8 OZ WATER SCHEDULE FOR COLONOSCOPY WITH DR. Leone Payor. PROCEDURE,RISKS,ALTERNATIVES DISCUSSED WITH PT. WILL OBTAIN CONSENT FROM DR. FRY REGARDING  ADVISABILITY OF HOLDING PLAVIX  PRE-PROCEDURE  PT ALSO WITH ABNORMALLY FUNCTIONING RIGHT KIDNEY ON CT-EVAL PENDING  Problem # 2:  CONSTIPATION (ICD-564.00) Assessment: New SEE ABOVE Orders: ZCOL (ZCOL)  Problem # 3:  CVA-STROKE (ICD-434.91) Assessment: Comment Only ON PLAVIX.ASA  Problem # 4:  CORONARY ARTERY DISEASE (ICD-414.00) Assessment: Comment Only S/P MI,CABG  Problem # 5:  ANXIETY (ICD-300.00) Assessment: Comment Only  Problem # 6:  ABNORMAL FINDINGS GI TRACT (ICD-793.4)  Patient Instructions: 1)  You have been scheduled for a colonoscopy at [facility] on 09/04/10 @ 9:30 am. Please arrive at Outpatient Registration 8:30 am for registration. 2)  We have sent a letter to Dr Gershon Crane regarding your Plavix. We will contact you once we have gotten his response as to whether you can stop or not. 3)  Please pick up your Moviprep Prescription at your pharmacy.  An electronic prescription has been sent to your pharmacy. 4)  Please begin taking Miralax 17 grams (1 capful) once daily. A presciption has been sent to your pharmacy for this as well.  5)  Copy sent to : Dr Gershon Crane, Dr Stan Head 6)  The medication list was reviewed and reconciled.  All changed / newly prescribed medications were explained.  A complete medication list was provided to the patient / caregiver. Prescriptions: MIRALAX  POWD (POLYETHYLENE GLYCOL 3350) Take 17 grams (1 capful) dissolved in at least 8 ounces of water and drink once daily  #527 grams x 0   Entered by:   Lamona Curl CMA (AAMA)   Authorized by:   Sammuel Cooper PA-c   Signed by:   Lamona Curl CMA (AAMA) on 08/22/2010   Method used:   Electronically to        [facility] [address] (retail)       [phone]       [place], [place]  [address]       Ph: [phone]       Fax: [phone]   RxID:  [id number] MOVIPREP 100 GM  SOLR (PEG-KCL-NACL-NASULF-NA ASC-C) As per prep instructions.  #1 x 0   Entered by:   Lamona Curl CMA (AAMA)   Authorized by:   Sammuel Cooper PA-c   Signed by:   Lamona Curl CMA (AAMA) on 08/22/2010   Method used:   Electronically to        [facility] [address] (retail)       [id number]       [place], [place]  [address]       Ph: [phone]       Fax: [phone]   RxID:   [id number]

## 2011-01-28 NOTE — Procedures (Signed)
Summary: Instructions for procedure/[place]  Instructions for procedure/[place]   Imported By: Sherian Rein 08/28/2010 08:57:16  _____________________________________________________________________  External Attachment:    Type:   Image     Comment:   External Document

## 2011-01-28 NOTE — Progress Notes (Signed)
Summary: refill percocet  Phone Note Call from Patient   Caller: Patient Call For: Nelwyn Salisbury MD Summary of Call: Refill Percocet Initial call taken by: Raechel Ache, RN,  April 10, 2010 10:47 AM  Follow-up for Phone Call        done Follow-up by: Nelwyn Salisbury MD,  April 10, 2010 1:13 PM  Additional Follow-up for Phone Call Additional follow up Details #1::        Phone Call Completed Additional Follow-up by: Raechel Ache, RN,  April 10, 2010 1:19 PM    Prescriptions: PERCOCET 10-650 MG TABS (OXYCODONE-ACETAMINOPHEN) 1 q 6 hours as needed pain  #120 x 0   Entered and Authorized by:   Nelwyn Salisbury MD   Signed by:   Nelwyn Salisbury MD on 04/10/2010   Method used:   Print then Give to Patient   RxID:   [id number]

## 2011-01-28 NOTE — Progress Notes (Signed)
Summary: triage / needs GI appointment  Phone Note From Other Clinic Call back at [phone] (have Terri paged)   Caller: Camelia Eng, ref coordinator Call For: Dr. Marina Goodell  (doctor of the day) Reason for Call: Schedule Patient Appt Summary of Call: Dr. Clent Ridges would like pt worked in asap for severe constipation and abd pain Initial call taken by: Vallarie Mare,  August 22, 2010 10:41 AM  Follow-up for Phone Call        Strum ntfd.  that P.A. will see  pt. this afternoon.He will come for appt.. Follow-up by: Teryl Lucy RN,  August 22, 2010 11:03 AM

## 2011-01-28 NOTE — Progress Notes (Signed)
Summary: refill alprazolam  Phone Note Refill Request Message from:  Pharmacy on June 04, 2009 12:59 PM  Refills Requested: Medication #1:  ALPRAZOLAM 0.5 MG  TB24 1 every 6 hours Initial call taken by: Alfred Levins, CMA,  June 04, 2009 12:59 PM  Follow-up for Phone Call        call in #120 with 5 rf Follow-up by: Nelwyn Salisbury MD,  June 04, 2009 1:01 PM  Additional Follow-up for Phone Call Additional follow up Details #1::        Rx called to pharmacy Additional Follow-up by: Alfred Levins, CMA,  June 04, 2009 1:03 PM      Prescriptions: ALPRAZOLAM 0.5 MG  TB24 (ALPRAZOLAM) 1 every 6 hours  #120 x 5   Entered by:   Alfred Levins, CMA   Authorized by:   Nelwyn Salisbury MD   Signed by:   Alfred Levins, CMA on 06/04/2009   Method used:   Telephoned to ...       [facility] [address] [phone]* (retail)       [address]       [place], [place]  [address]       Ph: [phone]       Fax: [phone]   RxID:   [id number]

## 2011-01-28 NOTE — Progress Notes (Signed)
Summary: questions about meds  Phone Note Call from Patient   Summary of Call: Pt states that he can only afford ONE of his meds, and wants to know which is the most important. [phone] Initial call taken by: Lynann Beaver CMA,  October 11, 2009 11:25 AM  Follow-up for Phone Call        I would definitely stay on Plavix because it would be horrible for him to have a bad stroke Follow-up by: Nelwyn Salisbury MD,  October 11, 2009 11:53 AM  Additional Follow-up for Phone Call Additional follow up Details #1::        Pt notified. Additional Follow-up by: Lynann Beaver CMA,  October 11, 2009 12:00 PM

## 2011-01-28 NOTE — Progress Notes (Signed)
Summary: refill  Phone Note Call from Patient Call back at Home Phone [phone]   Caller: Patient Call For: Nelwyn Salisbury MD Summary of Call: wants refill Percocet Initial call taken by: Raechel Ache, RN,  February 26, 2010 2:11 PM  Follow-up for Phone Call        done Follow-up by: Nelwyn Salisbury MD,  February 26, 2010 4:44 PM  Additional Follow-up for Phone Call Additional follow up Details #1::        rx up front ready for p/u, pt aware Additional Follow-up by: Alfred Levins, CMA,  February 26, 2010 4:56 PM    New/Updated Medications: PERCOCET 10-650 MG TABS (OXYCODONE-ACETAMINOPHEN) 1 q 6 hours as needed pain Prescriptions: PERCOCET 10-650 MG TABS (OXYCODONE-ACETAMINOPHEN) 1 q 6 hours as needed pain  #120 x 0   Entered and Authorized by:   Nelwyn Salisbury MD   Signed by:   Nelwyn Salisbury MD on 02/26/2010   Method used:   Print then Give to Patient   RxID:   [id number]

## 2011-01-28 NOTE — Letter (Signed)
Summary: Patient Notice-Hyperplastic Polyps  [facility]  [address] [place], [place] [address]   Phone: [phone]  Fax: [phone]        September 09, 2010 MRN: [id number]    Wayne Williams [address] [place], [place]  [address]    Dear Wayne Williams,  I am pleased to inform you that the colon polyp removed during your recent colonoscopy was found to be hyperplastic.  These types of polyps are NOT pre-cancerous.  It is therefore my recommendation that you have a repeat colonoscopy examination in 10 years for routine colorectal cancer screening.  Should you develop new or worsening symptoms of abdominal pain, bowel habit changes or bleeding from the rectum or bowels, please schedule an evaluation with either your primary care physician or with me.  Continue treatment plan as outlined the day of your exam. You will see what was recommended in the recommendations section of the colonoscopy report. There was also a typographical error and your last name was listed as Bodkin on the report. I have had that corrected and apologize for the error.  Please call us if you are having persistent problems or have questions about your condition that have not been fully answered at this time.  Sincerely,  Iva Boop MD, [facility] This letter has been electronically signed by your physician.  Appended Document: Patient Notice-Hyperplastic Polyps Letter mailed to patient.

## 2011-01-28 NOTE — Progress Notes (Signed)
Summary: percocet  Phone Note Call from Patient Call back at Home Phone [phone]   Caller: live Call For: fry Summary of Call: Request refill percocet, written rx he'll pickup for the neck & low back pain that continues after surgery.   Initial call taken by: Rudy Jew, RN,  December 14, 2009 10:59 AM  Follow-up for Phone Call        done Follow-up by: Nelwyn Salisbury MD,  December 14, 2009 11:27 AM  Additional Follow-up for Phone Call Additional follow up Details #1::        rx up front ready for p/u, pt aware Additional Follow-up by: Alfred Levins, CMA,  December 14, 2009 11:36 AM    Prescriptions: PERCOCET 10-650 MG TABS (OXYCODONE-ACETAMINOPHEN) 1 q 6 hours as needed pain  #120 x 0   Entered and Authorized by:   Nelwyn Salisbury MD   Signed by:   Nelwyn Salisbury MD on 12/14/2009   Method used:   Print then Give to Patient   RxID:   [id number]

## 2011-01-28 NOTE — Letter (Signed)
Summary: [facility] Instructions  [facility]  [address] [place], [place] [address]   Phone: [phone]  Fax: [phone]       Wayne Williams    11/21/1951    MRN: [id number]        Procedure Day Dorna Bloom: Wednesday 09/04/10     Arrival Time: 8:30 am     Procedure Time: 9:30 am     Location of Procedure:                     _x _  [facility] ( Outpatient Registration)                      PREPARATION FOR COLONOSCOPY WITH MOVIPREP   Starting 5 days prior to your procedure (08/30/10) do not eat nuts, seeds, popcorn, corn, beans, peas,  salads, or any raw vegetables.  Do not take any fiber supplements (e.g. Metamucil, Citrucel, and Benefiber).  THE DAY BEFORE YOUR PROCEDURE         DATE: 09/03/10  DAY: Tuesday  1.  Drink clear liquids the entire day-NO SOLID FOOD  2.  Do not drink anything colored red or purple.  Avoid juices with pulp.  No orange juice.  3.  Drink at least 64 oz. (8 glasses) of fluid/clear liquids during the day to prevent dehydration and help the prep work efficiently.  CLEAR LIQUIDS INCLUDE: Water Jello Ice Popsicles Tea (sugar ok, no milk/cream) Powdered fruit flavored drinks Coffee (sugar ok, no milk/cream) Gatorade Juice: apple, white grape, white cranberry  Lemonade Clear bullion, consomm, broth Carbonated beverages (any kind) Strained chicken noodle soup Hard Candy                             4.  In the morning, mix first dose of MoviPrep solution:    Empty 1 Pouch A and 1 Pouch B into the disposable container    Add lukewarm drinking water to the top line of the container. Mix to dissolve    Refrigerate (mixed solution should be used within 24 hrs)  5.  Begin drinking the prep at 5:00 p.m. The MoviPrep container is divided by 4 marks.   Every 15 minutes drink the solution down to the next mark (approximately 8 oz) until the full liter is complete.   6.  Follow completed prep with 16 oz of clear liquid of your choice  (Nothing red or purple).  Continue to drink clear liquids until bedtime.  7.  Before going to bed, mix second dose of MoviPrep solution:    Empty 1 Pouch A and 1 Pouch B into the disposable container    Add lukewarm drinking water to the top line of the container. Mix to dissolve    Refrigerate  THE DAY OF YOUR PROCEDURE      DATE: 09/04/10 DAY: Tuesday  Beginning at  4:30 a.m. (5 hours before procedure):         1. Every 15 minutes, drink the solution down to the next mark (approx 8 oz) until the full liter is complete.  2. Follow completed prep with 16 oz. of clear liquid of your choice.    3. You may drink clear liquids until 5:30 am (4 HOURS BEFORE PROCEDURE).   MEDICATION INSTRUCTIONS  Unless otherwise instructed, you should take regular prescription medications with a small sip of water   as early as possible the morning  of your procedure.  We will contact the physician who prescribes your Plavix to find out whether we need to discontinue your Plavix or keep you on it. If you have not heard from our office AT LEAST 1 week prior to your scheduled procedure, please call us at [phone].            OTHER INSTRUCTIONS  You will need a responsible adult at least 62 years of age to accompany you and drive you home.   This person must remain in the waiting room during your procedure.  Wear loose fitting clothing that is easily removed.  Leave jewelry and other valuables at home.  However, you may wish to bring a book to read or  an iPod/MP3 player to listen to music as you wait for your procedure to start.  Remove all body piercing jewelry and leave at home.  Total time from sign-in until discharge is approximately 2-3 hours.  You should go home directly after your procedure and rest.  You can resume normal activities the  day after your procedure.  The day of your procedure you should not:   Drive   Make legal decisions   Operate machinery   Drink  alcohol   Return to work  You will receive specific instructions about eating, activities and medications before you leave.    The above instructions have been reviewed and explained to me by Lamona Curl CMA Duncan Dull)  August 22, 2010 3:22 PM     I fully understand and can verbalize these instructions _____________________________ Date 08/22/10

## 2011-01-28 NOTE — Progress Notes (Signed)
Summary: Percocet?  Phone Note Call from Patient   Caller: Patient Call For: Nelwyn Salisbury MD Summary of Call: Pt is having back pain again, and would like a refill on his Percocet,please.  [phone] Nicolette [facility])  Initial call taken by: Lynann Beaver CMA,  July 13, 2009 12:59 PM  Follow-up for Phone Call        done, but will need an OV before any more after that Follow-up by: Nelwyn Salisbury MD,  July 13, 2009 4:56 PM  Additional Follow-up for Phone Call Additional follow up Details #1::        rx up front, pt aware Additional Follow-up by: Alfred Levins, CMA,  July 13, 2009 5:05 PM    Prescriptions: PERCOCET 10-650 MG TABS (OXYCODONE-ACETAMINOPHEN) 1 q 6 hours as needed pain  #60 x 0   Entered and Authorized by:   Nelwyn Salisbury MD   Signed by:   Nelwyn Salisbury MD on 07/13/2009   Method used:   Print then Give to Patient   RxID:   [id number]

## 2011-01-28 NOTE — Assessment & Plan Note (Signed)
Summary: Wayne Williams, BACK, KNEE PAIN/CJR   Vital Signs:  Patient profile:   60 year old male Weight:      188 pounds Temp:     98.2 degrees F oral Pulse rate:   80 / minute Pulse rhythm:   regular BP sitting:   120 / 84 Cuff size:   regular  Vitals Entered By: Raechel Ache, RN (May 11, 2009 10:10 AM)  History of Present Illness: Here for onset one month ago of low back pain which radiates down the left thigh. No recent trauma. Motrin does not help.   Allergies (verified): No Known Drug Allergies  Past History:  Past Medical History:    Reviewed history from 08/11/2008 and no changes required:    Anxiety    insomnia    Hypertension    Cerebrovascular accident, hx of, on 06-17-08    GERD    ED    Depression, saw Dr. Donell Beers in the past    Coronary artery disease    Myocardial infarction, hx of, 2001  Past Surgical History:    Lumbar laminectomy 1995, per Dr. Darrelyn Hillock    Cervical laminectomy 1996, per Dr. Gerlene Fee  Review of Systems  The patient denies anorexia, fever, weight loss, weight gain, vision loss, decreased hearing, hoarseness, chest pain, syncope, dyspnea on exertion, peripheral edema, prolonged cough, headaches, hemoptysis, abdominal pain, melena, hematochezia, severe indigestion/heartburn, hematuria, incontinence, genital sores, muscle weakness, suspicious skin lesions, transient blindness, difficulty walking, depression, unusual weight change, abnormal bleeding, enlarged lymph nodes, angioedema, breast masses, and testicular masses.    Physical Exam  General:  walks with a slight limp Lungs:  Normal respiratory effort, chest expands symmetrically. Lungs are clear to auscultation, no crackles or wheezes. Heart:  Normal rate and regular rhythm. S1 and S2 normal without gallop, murmur, click, rub or other extra sounds. Msk:  tender over the left sid eof the lower back and over the sciatic notch, full ROM. Negative SLR. Neurologic:  alert & oriented X3.      Impression & Recommendations:  Problem # 1:  BACK PAIN, LUMBAR (ICD-724.2)  His updated medication list for this problem includes:    Bayer Low Strength 81 Mg Tbec (Aspirin) ..... Once daily    Tramadol Hcl 50 Mg Tabs (Tramadol hcl) .Marland Kitchen... 1 or 2 tabs every 4 hours as needed pain  Complete Medication List: 1)  Alprazolam 0.5 Mg Tb24 (Alprazolam) .Marland Kitchen.. 1 every 6 hours 2)  Zocor 40 Mg Tabs (Simvastatin) .Marland Kitchen.. 1 at bedtime 3)  Lopressor 50 Mg Tabs (Metoprolol tartrate) .Marland Kitchen.. 1 1/2 two times a day 4)  Hydrochlorothiazide 25 Mg Tabs (Hydrochlorothiazide) .Marland Kitchen.. 1 once daily 5)  Tricor 145 Mg Tabs (Fenofibrate) .Marland Kitchen.. 1 once daily 6)  Plavix 75 Mg Tabs (Clopidogrel bisulfate) .Marland Kitchen.. 1 once daily 7)  Chantix Starting Month Pak 0.5 Mg X 11 & 1 Mg X 42 Misc (Varenicline tartrate) .... As directed 8)  Chantix Continuing Month Pak 1 Mg Tabs (Varenicline tartrate) .... As directed 9)  Bayer Low Strength 81 Mg Tbec (Aspirin) .... Once daily 10)  Tramadol Hcl 50 Mg Tabs (Tramadol hcl) .Marland Kitchen.. 1 or 2 tabs every 4 hours as needed pain 11)  Sterapred Ds 12 Day 10 Mg Tabs (Prednisone) .... As directed  Patient Instructions: 1)  Try Prednisone and Tramadol. Heat, stretches.  2)  Please schedule a follow-up appointment as needed .  Prescriptions: STERAPRED DS 12 DAY 10 MG TABS (PREDNISONE) as directed  #1 x 0   Entered and  Authorized by:   Nelwyn Salisbury MD   Signed by:   Nelwyn Salisbury MD on 05/11/2009   Method used:   Print then Give to Patient   RxID:   [id number] TRAMADOL HCL 50 MG TABS (TRAMADOL HCL) 1 or 2 tabs every 4 hours as needed pain  #120 x 5   Entered and Authorized by:   Nelwyn Salisbury MD   Signed by:   Nelwyn Salisbury MD on 05/11/2009   Method used:   Print then Give to Patient   RxID:   [id number]

## 2011-01-28 NOTE — Assessment & Plan Note (Signed)
Summary: cpx/ok per Cindy/jls   Vital Signs:  Patient Profile:   60 Years Old Male Weight:      187 pounds Temp:     98.4 degrees F oral Pulse rate:   96 / minute Pulse rhythm:   regular BP sitting:   108 / 76  (left arm) Cuff size:   regular  Vitals Entered By: Raechel Ache, RN (August 11, 2008 11:24 AM)                 Chief Complaint:  Hosp f/u. Frustrated about trouble walking- went to rehab.Marland Kitchen  History of Present Illness: Was admitted to [facility] on 06-17-08 for acute onset right leg weakness. Found to have a left sided stroke which fortunately spared his speech centers. Had an inpatient rehab stay per Dr. Lamar Benes from 06-28-08 to 07-07-08 for PT and OT. He then went home and had PT and OT for about a month. Now he is on his own. Still has some weakness in the right leg and some unsteadiness of gait. Uses a cane off and on. Is driving again. Needs refills on meds. Also wants to quit smoking and needs a rx for Chantix.     Current Allergies (reviewed today): No known allergies   Past Medical History:    Reviewed history and no changes required:       Anxiety       insomnia       Hypertension       Cerebrovascular accident, hx of, on 06-17-08       GERD       ED       Depression, saw Dr. Donell Beers in the past       Coronary artery disease       Myocardial infarction, hx of, 2001         Past Surgical History:    Reviewed history and no changes required:       Lumbar laminectomy 1995       Cervical laminectomy 1996          Family History:    Reviewed history and no changes required:       Family History of CAD Male 1st degree relative <50       Family History Hypertension  Social History:    Reviewed history and no changes required:       Divorced       Current Smoker       Alcohol use-yes       disabled   Risk Factors:  Tobacco use:  current Alcohol use:  yes    Physical Exam  General:     Well-developed,well-nourished,in no acute distress;  alert,appropriate and cooperative throughout examination Neck:     No deformities, masses, or tenderness noted. Lungs:     Normal respiratory effort, chest expands symmetrically. Lungs are clear to auscultation, no crackles or wheezes. Heart:     Normal rate and regular rhythm. S1 and S2 normal without gallop, murmur, click, rub or other extra sounds. Neurologic:     alert & oriented X3 and cranial nerves II-XII intact.  Motor is intact except weakness in the right leg. Has a slight limp when walking.    Impression & Recommendations:  Problem # 1:  ANXIETY (ICD-300.00)  His updated medication list for this problem includes:    Alprazolam 0.5 Mg Tb24 (Alprazolam) .Marland Kitchen... 1 at bedtime   Problem # 2:  HYPERTENSION (ICD-401.9)  His updated medication list for  this problem includes:    Lopressor 50 Mg Tabs (Metoprolol tartrate) .Marland Kitchen... 1 1/2 two times a day    Hydrochlorothiazide 25 Mg Tabs (Hydrochlorothiazide) .Marland Kitchen... 1 once daily   Problem # 3:  CEREBROVASCULAR ACCIDENT, HX OF (ICD-V12.50)  Problem # 4:  GERD (ICD-530.81)  Problem # 5:  DEPRESSION (ICD-311)  His updated medication list for this problem includes:    Alprazolam 0.5 Mg Tb24 (Alprazolam) .Marland Kitchen... 1 at bedtime   Problem # 6:  CORONARY ARTERY DISEASE (ICD-414.00)  His updated medication list for this problem includes:    Lopressor 50 Mg Tabs (Metoprolol tartrate) .Marland Kitchen... 1 1/2 two times a day    Hydrochlorothiazide 25 Mg Tabs (Hydrochlorothiazide) .Marland Kitchen... 1 once daily    Plavix 75 Mg Tabs (Clopidogrel bisulfate) .Marland Kitchen... 1 once daily    Bayer Low Strength 81 Mg Tbec (Aspirin) ..... Once daily   Problem # 7:  NICOTINE ADDICTION (ICD-305.1)  His updated medication list for this problem includes:    Chantix Starting Month Pak 0.5 Mg X 11 & 1 Mg X 42 Misc (Varenicline tartrate) .Marland Kitchen... As directed    Chantix Continuing Month Pak 1 Mg Tabs (Varenicline tartrate) .Marland Kitchen... As directed  Orders: Tobacco use cessation intensive >10  minutes ([address])   Complete Medication List: 1)  Alprazolam 0.5 Mg Tb24 (Alprazolam) .Marland Kitchen.. 1 at bedtime 2)  Zocor 40 Mg Tabs (Simvastatin) .Marland Kitchen.. 1 at bedtime 3)  Lopressor 50 Mg Tabs (Metoprolol tartrate) .Marland Kitchen.. 1 1/2 two times a day 4)  Hydrochlorothiazide 25 Mg Tabs (Hydrochlorothiazide) .Marland Kitchen.. 1 once daily 5)  Tricor 145 Mg Tabs (Fenofibrate) .Marland Kitchen.. 1 once daily 6)  Plavix 75 Mg Tabs (Clopidogrel bisulfate) .Marland Kitchen.. 1 once daily 7)  Claritin 10 Mg Caps (Loratadine) .Marland Kitchen.. 1 once daily 8)  Chantix Starting Month Pak 0.5 Mg X 11 & 1 Mg X 42 Misc (Varenicline tartrate) .... As directed 9)  Chantix Continuing Month Pak 1 Mg Tabs (Varenicline tartrate) .... As directed 10)  Bayer Low Strength 81 Mg Tbec (Aspirin) .... Once daily   Patient Instructions: 1)  Add ASA 81 mg once daily to Plavix. Given 3 months of Chantix. 2)  Please schedule a follow-up appointment in 3 months.   Prescriptions: CHANTIX CONTINUING MONTH PAK 1 MG  TABS (VARENICLINE TARTRATE) as directed  #1 x 1   Entered and Authorized by:   Nelwyn Salisbury MD   Signed by:   Nelwyn Salisbury MD on 08/11/2008   Method used:   Print then Give to Patient   RxID:   [id number] CHANTIX STARTING MONTH PAK 0.5 MG X 11 & 1 MG X 42  MISC (VARENICLINE TARTRATE) as directed  #1 x 0   Entered and Authorized by:   Nelwyn Salisbury MD   Signed by:   Nelwyn Salisbury MD on 08/11/2008   Method used:   Print then Give to Patient   RxID:   [id number] PLAVIX 75 MG  TABS (CLOPIDOGREL BISULFATE) 1 once daily  #30 x 11   Entered and Authorized by:   Nelwyn Salisbury MD   Signed by:   Nelwyn Salisbury MD on 08/11/2008   Method used:   Print then Give to Patient   RxID:   [id number] TRICOR 145 MG  TABS (FENOFIBRATE) 1 once daily  #30 x 11   Entered and Authorized by:   Nelwyn Salisbury MD   Signed by:   Nelwyn Salisbury MD on 08/11/2008   Method used:  Print then Give to Patient   RxID:   [id number] HYDROCHLOROTHIAZIDE 25 MG  TABS (HYDROCHLOROTHIAZIDE)  1 once daily  #30 x 11   Entered and Authorized by:   Nelwyn Salisbury MD   Signed by:   Nelwyn Salisbury MD on 08/11/2008   Method used:   Print then Give to Patient   RxID:   [id number] LOPRESSOR 50 MG  TABS (METOPROLOL TARTRATE) 1 1/2 two times a day  #90 x 11   Entered and Authorized by:   Nelwyn Salisbury MD   Signed by:   Nelwyn Salisbury MD on 08/11/2008   Method used:   Print then Give to Patient   RxID:   [id number] ZOCOR 40 MG  TABS (SIMVASTATIN) 1 at bedtime  #30 x 11   Entered and Authorized by:   Nelwyn Salisbury MD   Signed by:   Nelwyn Salisbury MD on 08/11/2008   Method used:   Print then Give to Patient   RxID:   [id number] ALPRAZOLAM 0.5 MG  TB24 (ALPRAZOLAM) 1 at bedtime  #30 x 5   Entered and Authorized by:   Nelwyn Salisbury MD   Signed by:   Nelwyn Salisbury MD on 08/11/2008   Method used:   Print then Give to Patient   RxID:   [id number]  ]

## 2011-01-28 NOTE — Progress Notes (Signed)
Summary: made appt on 8 14       Additional Follow-up for Phone Call Additional follow up Details #2::    made appt to see Dr. Clent Ridges on 08-11-08. Follow-up by: Celine Ahr,  August 11, 2008 3:04 PM

## 2011-01-28 NOTE — Progress Notes (Signed)
Summary: meds to much  Phone Note Call from Patient Call back at Home Phone [phone]   Caller: Patient Call For: Delmar Arriaga Summary of Call: pt said rx's are over $300 and he can not afford that.  The Tricor and Plavix were the most expensive.  Can you give him something else or samples? Initial call taken by: Alfred Levins, CMA,  October 10, 2009 2:59 PM  Follow-up for Phone Call        There is nothing else to substitute for the Plavix, but we can switch from Tricor to generic Fenofibrate 160 mg once daily , call in one year supply Follow-up by: Nelwyn Salisbury MD,  October 10, 2009 4:38 PM  Additional Follow-up for Phone Call Additional follow up Details #1::        Phone Call Completed, Rx Called In, pt is not going to take plavix cause its to expensive Additional Follow-up by: Alfred Levins, CMA,  October 10, 2009 4:53 PM    New/Updated Medications: FENOFIBRATE 160 MG TABS (FENOFIBRATE) 1 by mouth once daily Prescriptions: FENOFIBRATE 160 MG TABS (FENOFIBRATE) 1 by mouth once daily  #30 x 11   Entered by:   Alfred Levins, CMA   Authorized by:   Nelwyn Salisbury MD   Signed by:   Alfred Levins, CMA on 10/10/2009   Method used:   Electronically to        [facility] [address] (retail)       [phone]       [place], [place]  [address]       Ph: [phone]       Fax: [phone]   RxID:   [id number]

## 2011-03-19 ENCOUNTER — Other Ambulatory Visit: Payer: Self-pay | Admitting: Family Medicine

## 2011-03-21 NOTE — Telephone Encounter (Signed)
Alprazolam called to [facility] 

## 2011-03-21 NOTE — Telephone Encounter (Signed)
Call in #[phone] with 5 rf 

## 2011-05-12 ENCOUNTER — Other Ambulatory Visit: Payer: Self-pay | Admitting: Family Medicine

## 2011-05-13 NOTE — H&P (Signed)
NAMEHENERY, Wayne [name]:  [id number]   MEDICAL RECORD NO.:  [id number]          PATIENT TYPE:  IPS   LOCATION:  4038                         FACILITY:  [facility]   PHYSICIAN:  Ellwood Dense, M.D.   DATE OF BIRTH:  1951-12-04   DATE OF ADMISSION:  06/28/2008  DATE OF DISCHARGE:                              HISTORY & PHYSICAL   PRIMARY CARE PHYSICIAN:  Tera Mater. Clent Ridges, MD   CARDIOLOGIST:  [name], MD, [facility]   HISTORY OF PRESENT ILLNESS:  Mr. Ackert is a 60 year old male with  history of coronary artery disease with prior bypass surgery along with  degenerative disk disease of the cervical and lumbar spine with prior  surgeries.   The patient was admitted on June 25, 2008, after a 24-hour history of  right lower extremity weakness.  Initial cranial CT showed no acute  changes.  MRI and MRA study of the brain and neck showed small  nonhemorrhagic infarct in the left ventricular nucleus to the posterior  corona radiata.  There was moderate small vessel disease with  intracranial atherosclerosis noted with no carotid stenosis.  Carotid  Doppler showed no internal carotid artery stenosis.  A 2-D  echocardiogram showed ejection fraction of 50-55% with mild aortic and  mitral regurgitations.  The patient had labile blood pressure initially  with medication being adjusted.  We started on Aggrenox for stroke  prophylaxis but that was discontinued due to questionable hyperaction.  He was subsequently changed to Plavix for stroke prophylaxis.   Therapies have been ongoing with the patient showing decreased balance  and decreased coordination, especially in the right lower extremity  requiring min-to-mod assist for ADLs and min-to-mod assist for standing  balance and ambulation.  He has a tendency to hyperextend his right knee  for stability.   The patient was evaluated by the rehabilitation physicians and felt to  be an appropriate candidate for inpatient  rehabilitation.   REVIEW OF SYSTEMS:  Positive for insomnia.   PAST MEDICAL HISTORY:  1. History of coronary artery disease with prior CABG in 2001.  2. Cervical decompression in 1996.  3. Lumbar decompression in 1995.  4. History of remote peptic ulcer disease with bleeding problems in      his 20s.  5. Borderline hypertension.   FAMILY HISTORY:  Positive for coronary artery disease.   SOCIAL HISTORY:  The patient is divorced and lives in a condo with no  steps to enter but 13 steps to the bedroom.  He is on disability  secondary to heart disease and back problems.  He smokes one-pack of  cigarettes per day and reports that he drinks a 6-pack of beer per day.  He does have a history of polysubstance abuse in the past.  His daughter  is in town and can assist as necessary.   FUNCTIONAL HISTORY:  Prior to admission:  Independent with ADLs and  driving.   ALLERGIES:  AGGRENOX causes questionable hives.   MEDICATIONS:  Prior to admission:  Xanax nightly.   LABORATORY DATA:  Recent hemoglobin was 17.1 with hematocrit of 49.6,  platelet count of 239,000, and white count of 7.9.  Recent total  cholesterol was 146, LDL cholesterol of 82, HDL cholesterol of 24 and  triglycerides of 198.  Recent homocystine level was 13.4 with a  hemoglobin A1c of 5.9.  Recent sodium is 137, potassium 4.2, chloride  105, bicarbonate not noted with BUN of 18 and creatinine of 1.0 with  glucose of 109.   PHYSICAL EXAMINATION:  GENERAL:  A well-appearing adult male seated at  the edge of the bed, in no acute discomfort.  VITAL SIGNS:  Blood pressure is 180/104 with pulse of 83, respiratory  rate 20, and temperature 97.3.  HEENT:  Normocephalic and nontraumatic.  CARDIOVASCULAR:  Regular rate and rhythm.  S1 and S2 without murmurs.  ABDOMEN:  Soft and nontender with positive bowel sounds.  LUNGS:  Clear to auscultation bilaterally.  NEUROLOGIC:  Alert and oriented x3.  Cranial nerves II through XII  are  intact.  Bilateral upper extremity exam shows 5-/5 strength throughout.  Bulk and tone are normal.  Reflexes are 2+ and symmetrical.  Sensation  is intact to light touch throughout the bilateral upper extremities.  Coordinated movements are only minimally slow on the right compared to  the left.  Lower extremity exam shows 4+/5 strength in the left lower  extremity and 4- to 4/5 strength in the right lower extremity.   DIAGNOSIS:  Status post left hemisphere stroke with mild right-sided  weakness, especially of the right leg and an impaired balance during  ambulation.   The patient has deficits in ADLs, transfers and ambulation related to  the above-noted left hemisphere stroke.   PLAN:  1. Admit to the rehabilitation unit for daily therapies to include      physical therapy for range of motion, strengthening, bed mobility,      transfers, pre-gait training, gait training, and equipment      evaluation.  2. Occupational therapy for range of motion, strengthening, ADLs,      cognitive/perceptual training, splinting, and equipment evaluation.  3. Speech therapy for higher level of cognition.  4. Rehab nursing for skin care, wound care, and bowel/bladder training      as necessary.  5. Case management to assess home environment, assist with discharge      planning, and arrange for appropriate followup care.  6. Social work to assess family and social support and assist in      discharge planning.  7. Continue a low-fat diet.  8. Check admission labs including CBC and CMET in a.m. on June 29, 2008.  9. Lopressor 75 mg p.o. b.i.d.  10.Hydrochlorothiazide 25 mg p.o. daily.  11.Protonix 40 mg p.o. daily.  12.Xanax 0.5 mg p.o. nightly.  13.Zocor 40 mg p.o. nightly.  14.Catapres 0.1 mg p.o. q.6 h. p.r.n. systolic for systolic blood      pressure greater than 180 or diastolic blood pressure greater than      or equal to 95.  15.TriCor 145 mg p.o. daily.  16.Plavix 75 mg p.o.  daily.  17.Claritin 10 mg p.o. daily.  18.Triamcinolone cream 0.1% to the rash b.i.d. p.r.n.   PROGNOSIS:  Good.   ESTIMATED LENGTH OF STAY:  3-5 days.   GOALS:  Modified independent, ADLs, transfers and ambulation.           ______________________________  Ellwood Dense, M.D.     DC/MEDQ  D:  06/28/2008  T:  06/29/2008  Job:  [id number]

## 2011-05-13 NOTE — Discharge Summary (Signed)
NAMECALLIE, [name]:  [id number]   MEDICAL RECORD NO.:  [id number]          PATIENT TYPE:  IPS   LOCATION:  4038                         FACILITY:  [facility]   PHYSICIAN:  Ellwood Dense, M.D.   DATE OF BIRTH:  December 18, 1951   DATE OF ADMISSION:  06/28/2008  DATE OF DISCHARGE:  07/07/2008                               DISCHARGE SUMMARY   DISCHARGE DIAGNOSES:  1. Left cerebrovascular accident.  2. Hypotension.  3. Dyslipidemia.  4. Insomnia.   HISTORY OF PRESENT ILLNESS:  Wayne Williams is a 60 year old male with  history of coronary artery disease and DDD, admitted on June 25, 2008,  with a 24-hour history of right lower extremity weakness.  CT of head  showed no acute changes.  MRI/MRA of brain and neck showed small  nonhemorrhagic infarct and left lenticular nucleus to posterior corona  radiata, moderate small vessel disease, intracranial atherosclerosis,  and no carotid stenosis.  Carotid Doppler showed no ICA stenosis.  A 2-D  echo done, showed EF of 50% to 55% with mild aortic and mitral regurg.  The patient was started on meds for labile hypertension.  He was  initially started on Aggrenox for CVA prophylaxis with question due to  high reaction or rash, he was changed over to Plavix.  Currently, the  patient continues to have decreased balance as well as poor coordination  of right lower extremity, requires min-to-mod assist for ADLs, min-to-  mod assist for standing balance with tendency for right knee to  hyperextend when ambulating.  PT and MD felt __________ evaluation  appropriate and the patient was transferred to rehab for progressive  therapies.   PAST MEDICAL HISTORY:  Significant for coronary artery disease with CABG  in 2001, cervical decompression in 1996, lumbar decompression in 1995,  peptic ulcer disease with remote history of bleeding problem in his 93s,  and borderline hypertension.   ALLERGIES:  Question to [facility].   FAMILY HISTORY  DISEASE:  Positive for coronary artery disease.   SOCIAL HISTORY:  The patient is divorced, lives in a condo with no steps  at entry, but 13 steps to bedroom.  He has been disabled secondary to  cardiac and back problems.  He smokes 1 pack per day.  He has a history  of polysubstance abuse in the past.  He drinks 6 packs of beer a day.  Daughter in town to check in on him at discharge.  The patient was  independent with assist device prior to admission.  He does stay dry.  Currently, with impairments in mobility and self-care.   PHYSICAL EXAMINATION AT ADMISSION:  GENERAL:  A well-nourished, well-  developed male in no acute distress.  HEENT:  Normocephalic and nontraumatic with extraocular movements  intact.  Nares patent.  Tongue midline.  CARDIAC:  Regular rate and rhythm without murmurs.  LUNGS:  Clear to auscultation bilaterally.  No wheezes.  ABDOMEN:  Soft and nontender with positive bowel sounds.  NEUROLOGIC:  The patient is alert and oriented x3.  No dysarthria.  Cranial nerves II through XII intact.  EXTREMITIES:  Bilateral upper extremity exam showed 5-/5 strength.  Both  bulk and tone were normal.  Reflexes were 2+, symmetric.  Left lower  extremity exam revealed 4+/5 strength with right lower extremity of 4-  to 4/5.  The patient was noted to have some ataxia with right lower  extremity greater than right upper extremity.  He was able to follow  commands without difficulty.   HOSPITAL COURSE:  [name] was admitted to Rehab on June 28, 2008, for inpatient therapies to consist of PT and OT at least 3 hours a  day.  Nursing has been monitoring the patient along for skin care and  wound care as well as bowel and bladder training.  The patient has been  __________ bowel and bladder with toileting.  Pain control has been  reasonable with p.r.n. Tylenol use.  The patient's blood pressures were  monitored on a b.i.d. basis during this stay and these have shown good   control recently, blood pressures ranging from 120s to rare high in 140s  systolic, 60s-80s diastolic.  Last weight is at 84 kg.  Labs were done  past admission revealing hemoglobin 16.6, hematocrit 47.1, white count  7.9, and platelets 255.  Check of lytes revealed sodium 135, potassium  4.4, chloride 104, CO2 32, BUN 13, creatinine 1.08, and glucose 123.  LFTs revealed T-bili 0.9, SGOT 25, SGPT 22, total protein 6.6, albumin  3.9, and calcium 9.4.  The patient was maintained on TriCor and Zocor  for his dyslipidemia.  Insomnia was reasonable with his home meds of  Xanax nightly.   The patient's mood has been stable.  He has participated along in  therapy and has progressed along well.  Speech Therapy evaluated the  patient at admission and felt no need for speech therapy.  As the  patient's expression and comprehension was intact, the patient was able  to read sentences without difficulty and able to copy independently.  The patient has made good progress in Rehab, progressing along from  supervision to min assist to modified independent level for overall  ambulation and transfers.  He requires supervision for car transfers and  community ambulation.  A straight cane was ordered to help with  mobility.  The patient was at supervision level for bathing with  supervision for lower body dressing, supervision for toileting with  decreased safety awareness and insight at the time of admission with OT  tasks.  At discharge, the patient had progressed along to modified  independent with bathing.  OT has worked on the patient with increased  safety with ADL and functional mobility in room.  They have also focused  on dynamic standing balance and bending over to pick up items as well as  home management tasks.  At the time of discharge, the patient is at  modified independent level.  He will continue with further followup  outpatient PT/OT at [facility] beginning July 12, 2008.  On July 07, 2008, the patient is discharged to home.   DISCHARGE MEDICATIONS:  1. Zocor 40 mg p.o. nightly.  2. Lopressor 50 mg 1.5 p.o. b.i.d.  3. Xanax 0.5 mg nightly.  4. Hydrochlorothiazide 25 mg a day.  5. TriCor 145 mg a day.  6. Plavix 75 mg a day.  7. Claritin 10 mg a day.   DIET:  Low fat.   ACTIVITY LEVEL:  At intermittent supervision.  No alcohol, no smoking,  no driving.   SPECIAL INSTRUCTIONS:  [facility] beginning July 12, 2008, at 8:45 to 10:45.   FOLLOWUP:  The patient is setup for followup with Dr. Thomasena Edis on July 26, 2008, at 12:00 p.m.  He is recommended to follow up with Dr. Clent Ridges in  2 weeks.      Greg Cutter, P.A.    ______________________________  Ellwood Dense, M.D.    PP/MEDQ  D:  07/12/2008  T:  07/13/2008  Job:  [id number]   cc:   Jeannett Senior A. Clent Ridges, MD

## 2011-05-13 NOTE — Assessment & Plan Note (Signed)
[name] returns to clinic today for followup evaluation.  He is a 60-  year-old male with a history of coronary artery disease and degenerative  disk disease.  He was admitted on June 17, 2008, with 24-hour history of  right lower extremity weakness.  CT of the brain showed no acute  changes.  MRI and MRA study of the brain and neck showed small  nonhemorrhagic infarct in the left lenticular nucleus to the posterior  corona radiata with moderate small vessel disease/intracranial abscess  grosses with no carotid stenosis.  Carotid Doppler showed no internal  carotid artery stenosis.  2-D echocardiogram showed an ejection fraction  of 50-55% with mild aortic and mitral regurgitation.  The patient was  started on medications for labile hypertension at that point.   The patient eventually stabilized and was moved to the Rehabilitation  Unit on June 28, 2008, and remained there through July 07, 2008.   Since discharge, the patient started in home health therapy and has been  progressed outpatient therapies including physical and occupational  therapy 2 times per week [address].  He ambulates  with a single-point cane.  He does watch his diet and tries to avoid  fatty and fried foods.  The patient reports a prior history of cocaine  along with marijuana and methamphetamine use.  He has beat all those  habits, but still is having a problem with tobacco.  He smokes 1/2 pack  of cigarettes per day.  He reports that the nicotine patch made him to  feel sick and he is planning to ask his primary care physician about  samples of Chantix.  He does not feel that he can afford a prescription  of the medication as he understands it is fairly expensive.   The patient is due to followup with his primary care physician Dr. Clent Williams  on August 29, 2008.  In the meantime, he needs to refill on medication  as noted below.   MEDICATIONS:  1. Zocor 40 mg p.o. nightly.  2. Lopressor 50 mg  1-1/2 tablets p.o. b.i.d.  3. Xanax 0.5 mg nightly.  4. Hydrochlorothiazide 25 mg daily.  5. TriCor 145 mg daily.  6. Plavix 75 mg daily.   REVIEW OF SYSTEMS:  Noncontributory.   PHYSICAL EXAMINATION:  GENERAL:  Well-appearing adult male in mild-to-no  acute discomfort.  He has 4+/5 strength in the right upper and right  lower extremity and 5-/5 strength in the left upper and left lower  extremity.  He ambulates with a single-point cane.  Sensation was intact  to light touch with bilateral lower extremities and cognition was good  along with speech.   IMPRESSION:  1. Status post left hemisphere infarct with mild right-sided weakness.  2. Hypertension.  3. Dyslipidemia.  4. Tobacco abuse.   In the office today, we did refill the patient's Plavix, Zocor,  Lopressor, Xanax, hydrochlorothiazide, TriCor, and Plavix.  We will plan  on seeing the patient in followup on an as needed basis, and he will be  seeing Dr. Clent Williams, his primary care physician, in the first part of  September.  He will be asking Dr. Clent Williams about Chantix samples, but  unfortunately I do not have any of those to give the patient in the  office today.  He understands it.  His risk factors for recurrent stroke  are dyslipidemia, hypertension, and tobacco abuse at the present time.   We will plan on seeing the patient in  followup on an as needed basis.           ______________________________  Wayne Williams, M.D.     DC/MedQ  D:  07/27/2008 09:02:17  T:  07/28/2008 00:21:19  Job #:  [id number]

## 2011-05-13 NOTE — Discharge Summary (Signed)
NAMELEKENDRICK, ALPERN                ACCOUNT NO.:  [id number]   MEDICAL RECORD NO.:  [id number]          PATIENT TYPE:  INP   LOCATION:  3035                         FACILITY:  [facility]   PHYSICIAN:  Valerie A. Felicity Coyer, MDDATE OF BIRTH:  Sep 19, 1951   DATE OF ADMISSION:  06/25/2008  DATE OF DISCHARGE:  06/28/2008                               DISCHARGE SUMMARY   DISCHARGE DIAGNOSES:  1. Acute left cerebrovascular accident.  2. Uncontrolled hypertension.  3. Hypertriglyceridemia.   HISTORY OF PRESENT ILLNESS:  [name] is a very pleasant 60 year old  white male with past medical history of CAD, peptic ulcer disease and  continued tobacco abuse, who presented to the emergency room on day of  admission with 24 hour history of right lower extremity weakness  consistent in nature.  The patient denied any speech or visual changes,  no weakness in other limbs and no paresthesias.  Initial CT done in the  emergency room was negative.  However, due to the patient's physical  exam and risk factors, the patient was admitted for further evaluation  and treatment.  MRI done upon admission to the hospital revealed a small  left-sided nonhemorrhagic acute infarct.  The patient was admitted at  that time for further evaluation and treatment.   PAST MEDICAL HISTORY:  1. CAD.  2. Peptic ulcer disease with history of remote GI bleed.  3. Degenerative disk disease.  4. Tobacco abuse.  5. Lumbar diskectomy 1995.  6. Cervical diskectomy 1996.  7. Status post bypass surgery in 2001.   NEW ALLERGIES DURING THIS ADMISSION:  caused hives.   HOSPITAL COURSE:  1. Acute left cerebrovascular accident as mentioned above.  MRI      confirmed left-sided nonhemorrhagic CVA.  A 2-D echo was performed      to rule out source of emboli which revealed normal left ventricular      ejection fraction with only mild mitral valve regurgitation and no      source of emboli.  In addition, carotid Dopplers were obtained      revealing no significant bilateral ICA stenosis.  The patient was      started on as prophylactic treatment for future CVAs, however, the      patient developed hives and itching approximately 45 minutes after      taking medication.  This medication was at that time discontinued.      He has been started on Plavix of which he has been tolerating well      greater than 24 hours at time of dictation.  The patient still with      some residual right lower extremity weakness and felt to be a good      candidate for [facility] of which we are awaiting final      decision at time of dictation.  Should the patient not be accepted      to [facility], he will be discharged home with home      health PT.  2. Uncontrolled hypertension.  Due to problem number 1, it is most  important to provide good blood pressure control.  Manual pressure      at the time of discharge 160/90.  The patient's beta blocker has      been titrated up accordingly.  The patient may need addition of      medication in the near future for further blood pressure control.  3. Mild hypertriglyceridemia.  Triglycerides mildly elevated at 198.      However, given problem number 1, we will start the patient during      this admission on Tricor to be followed outpatient by primary care      physician.   DISCHARGE MEDICATIONS:  1. HCTZ 25 mg p.o. daily.  2. Xanax 0.5 mg p.o. q.h.s.  3. Metoprolol 75 mg p.o. b.i.d.  4. Plavix 75 mg p.o. daily.  5. Tricor 145 mg p.o. q.h.s.  6. Tylenol 650 mg p.o. q.4 h. p.r.n.   LABORATORY DATA:  Pertinent lab work during hospitalization:  White cell  count 7.9, platelet count 239, hemoglobin 17.1, hematocrit 49.6, sodium  137, potassium 4.2, BUN 8, creatinine 1.0, total cholesterol 190, HDL  24, LDL 84, triglycerides 198.  Homocystine within normal limits.  Hemoglobin A1c 5.9.   DISPOSITION:  The patient felt medically stable for discharge to [facility]patient  Rehab at this time where he will likely undergo a brief  inpatient stay as he is moderate assist with independent activities at  time of discharge.      Cordelia Pen, NP      Raenette Rover. Felicity Coyer, MD  Electronically Signed    LE/MEDQ  D:  06/28/2008  T:  06/28/2008  Job:  [id number]   cc:   Rosalyn Gess. Norins, MD

## 2011-05-13 NOTE — H&P (Signed)
NAMEELIU, BATCH                ACCOUNT NO.:  [id number]   MEDICAL RECORD NO.:  [id number]          PATIENT TYPE:  INP   LOCATION:  3035                         FACILITY:  [facility]   PHYSICIAN:  Rosalyn Gess. Norins, MD  DATE OF BIRTH:  May 04, 1951   DATE OF ADMISSION:  06/25/2008  DATE OF DISCHARGE:                              HISTORY & PHYSICAL   CHIEF COMPLAINT:  Right lower extremity weakness.   HISTORY OF PRESENT ILLNESS:  Mr. Economou is a 60 year old single white  male who presents with a 24-hour history of proximal right lower  extremity weakness which has been persistent, but not worsening.  He had  rapid onset on Saturday, June 24, 2008.  The patient denies any other  neurologic symptoms, specifically no speech or visual changes, no  weakness in other limbs, and no paresthesias.  In the emergency  department, initial CT of the brain was negative.  Initial examination  reveals right proximal lower extremity weakness.  The patient is now  admitted for further evaluation of CVA.   PAST MEDICAL HISTORY:  1. CAD.  2. Peptic ulcer disease with history of bleed remotely.  3. Degenerative disk disease, back and neck.  4. Tobacco abuse.   SURGICAL HISTORY:  1. Lumbar diskectomy in 1995.  2. Cervical diskectomy in 1996.  3. Bypass surgery with LIMA to LAD, SVG to obtuse marginal, diagonal,      RCA, and posterior descending artery in 2001.   CURRENT MEDICATIONS:  Alprazolam 0.5 mg q.6 h. p.r.n.   HABITS:  The patient continues to smoke.  Drinking history not obtained.   REVIEW OF SYSTEMS:  Negative for any chest pain, pulmonary symptoms, or  GI symptoms.   FAMILY HISTORY:  Negative for diabetes, colon cancer, or prostate  cancer.   SOCIAL HISTORY:  The patient was married for 14 years and is divorced.  He has one daughter.  He has no grandchildren.  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  The  patient does live alone.  He is independent in  his activities of daily living.   PHYSICAL EXAMINATION:  VITAL SIGNS:  Temperature 99.1, blood pressure  174/98, heart rate 105, respirations 20, and O2 sat is 97% on room air.  GENERAL:  This is a well-nourished and well-developed white male looking  older than his stated chronologic age.  He is in no acute distress.  HEENT:  Normocephalic and atraumatic.  Conjunctivae are clear.  No  oropharyngeal lesions were noted.  NECK:  Supple without thyromegaly.  No lymphadenopathy was noted in the  cervical or supraclavicular regions.  CHEST:  No CVA tenderness.  No deformities noted.  He has a well-healed  sternotomy scar.  LUNGS:  The patient is clear to auscultation and percussion with no  rales, wheezes, or rhonchi.  No increased work of breathing.  CARDIOVASCULAR:  Radial pulses 2+.  No JVD.  No carotid bruit.  He has a  regular rate and rhythm without murmurs, rubs, or gallops.  ABDOMEN:  Bowel sounds were positive in all four quadrants.  He is soft  with no guarding or rebound.  No organosplenomegaly was noted.  GENITALIA AND RECTAL:  Deferred.  EXTREMITIES:  No clubbing, cyanosis, edema, or deformities are noted.  NEUROLOGIC:  The patient is alert and oriented x4.  Speech is clear.  Cognition and memory are normal.  Cranial nerves II through XII reveal  normal facial symmetry.  No deviation of the tongue or uvula.  PERRLA.  EOMI.  Unable to visualize his fundi.  He has normal shoulder shrugs.  Motor strength was 6/6 throughout except for proximal right lower  extremity which was 4/6.  Not being able to keep straight leg or leg  elevated against resistance.  DTRs normal throughout except the absence  of left patella and Achilles reflex, which is an old finding since his  back surgery.  SKIN:  Without lesions or abnormalities.   LABORATORY:  CT of brain negative.  Hemoglobin was 17.1 g, white count  was 7900, and platelet count 239,000.   Chemistries with a sodium of 137,  potassium 4.2, chloride 105, CO2 is 26, BUN of 8, creatinine 1.0, and  glucose 109.  UA was negative.   ASSESSMENT AND PLAN:  1. Neurologic:  The patient now with right proximal lower extremity      weakness suggestive of a left middle cerebral artery lacunar      infarct.  His risk factors include known vascular disease in a      setting where he continues to smoke.  Plan:  MRI and MRA of the      brain including carotids to rule out definitive cerebrovascular      accident.  We will start Aggrenox b.i.d. and will have physical      therapy/occupational therapy evaluation.  2. Cardiovascular:  The patient with known coronary artery disease      status post bypass surgery.  He has had no followup for 5 years.      He has been asymptomatic.  Plan:  We will start beta-blockers.  We      will get a fasting lipid profile.  Given his history, we will go      ahead and start simvastatin 40 mg p.o. daily.  3. Hypertension.  The patient is significantly hypertensive at today's      exam.  We will start beta-blocker as noted using Lopressor 25 mg      b.i.d. at this time and hydrochlorothiazide 25 mg p.o. daily.  4. Tobacco abuse.  Discussed with the patient the increased risks for      vascular disease with smoking.  I have asked him to stop smoking,      which he said he will consider.  We will start him on a nicotine      patch 21 mg on at 7:00 a.m. and off at 9:00 p.m.  5. Gastrointestinal:  History of peptic ulcer disease.  The patient is      asymptomatic at this time.  Plan:  We will start Protonix 40 mg      q.a.m. while in hospital.   In summary, this gentleman is presenting with probable CVA.  His  symptoms are relatively minor.  Anticipate a short hospital stay for  evaluation and diagnosis and plan for PT, OT, and rehab.      Rosalyn Gess Norins, MD  Electronically Signed    MEN/MEDQ  D:  06/25/2008  T:  06/26/2008  Job:  [id number]   cc:    Tera Mater. Clent Ridges, MD  [name], MD, [facility]

## 2011-05-16 NOTE — Op Note (Signed)
[place]  Patient:    Wayne Williams, Wayne Williams                       MRN: [id number] Proc. Date: 05/22/00 Adm. Date:  [id number] Disc. Date: [id number] Attending:  [name] CC:         [name], M.D. [facility]             Dr. Thayer Ohm Guest                           Operative Report  PREOPERATIVE DIAGNOSIS:  Unstable angina and wide complex tachycardia with severe three vessel disease.  POSTOPERATIVE DIAGNOSIS:  Unstable angina and wide complex tachycardia with severe three vessel disease.  PROCEDURE:  Median sternotomy, extracorporeal circulation, and coronary artery bypass grafting x 5 (LIMA to LAD, saphenous vein graft to obtuse marginal, saphenous vein graft to first diagonal, and sequential saphenous vein graft to distal right coronary and posterior descending).  SURGEON:  [name] [name], M.D.  ASSISTANT:  Areta Haber, P.A.  ANESTHESIA:  General.  FINDINGS:  Good quality target vessels and good quality conduits, overall preserved left ventricular function.  CLINICAL NOTE:  The patient is a 60 year old white male with a history of drug and ethanol abuse, and strong family history for coronary disease.  He is also a heavy smoker.  He presented with unstable angina and wide complex tachycardia.  He underwent catheterization which revealed severe three vessel coronary disease.  The patient was referred for coronary artery bypass grafting.  The indications, risks, benefits, and alternatives were discussed in detail with the patient.  He understood the risks and benefits and agreed to proceed.  DESCRIPTION OF PROCEDURE:  The patient was brought to the preoperative holding area on May 22, 2000.  Lines were placed to monitor arterial, central venous, and pulmonary arterial pressure.  EKG leads were placed for continuous telemetry.  The patient was taken to the operating room, anesthetized, and intubated.  A Foley catheter was  placed and intravenous antibiotics were administered.  The chest, abdomen, and legs were prepped and draped in the usual fashion.  A median sternotomy was performed and the left internal mammary artery was harvested.  Simultaneously, an incision was made in the medial aspect of the right leg and the greater saphenous vein was harvested from the ankle to the mid thigh.  Both the mammary artery and saphenous vein were of good quality. The patient was fully heparinized prior to dividing the distal end of the mammary artery.  There was excellent flow through the cut end of the graft. The mammary was placed in a papaverine soaked sponge and placed into the left pleural space.  The pericardium was opened and the ascending aorta was cannulated via concentric 2-0 Ethibond nonpledgeted pursestring sutures.  A dual stage venous cannula was placed via pursestring suture in the right atrial appendage. Cardiopulmonary bypass was instituted and the patient was cooled to 32 degrees Celsius.  Of note, there was no atherosclerotic plaque palpable in the ascending aorta.  The coronary arteries were inspected and anastomotic sites were chosen.  The conduits were inspected and cut to length.  A foam pad was placed in the pericardium to protect the left phrenic nerve.  A temperature probe was placed in the myocardial septum and a cardioplegia cannula was placed in the ascending aorta.  The aorta was cross-clamped and the left ventricle was  emptied via the aortic root vent.  Cardiac arrest then was achieved with a combination of cold antegrade cardioplegia and topical ice saline.  ________ cardioplegia was used.  After achieving complete diastolic arrest, the following distal anastomoses were performed.  First, a reverse saphenous vein graft was placed sequentially to the distal right coronary as well as to the distal posterior descending coronary.  A side-to-side anastomosis was performed to the distal  right coronary off of the side branch of the vein graft using a running 7-0 Prolene suture.  The distal right coronary was a good quality target.  The distal PDA was a small vessel and was compromised by a 75% lesion in the PDA.  This was only a fair quality vessel.  The PDA anastomosis also was performed with a running 7-0 Prolene suture.  There was excellent flow through the graft at the completion of both anastomoses.  Additional cardioplegia was administered down the graft and there was good hemostasis.  Next, a reverse saphenous vein graft was placed end-to-side to a large second obtuse marginal branch of the left circumflex coronary artery which had an 80% stenosis.  The vein graft was of good quality as was the target vessel.  The anastomosis was performed with a running 7-0 Prolene suture.  The anastomosis was probed proximally and distally at completion.  There was good flow to the anastomosis.  Cardioplegia was administered down both vein grafts.  Next, a reverse saphenous vein graft was placed end-to-side to the second diagonal branch to the LAD.  This was the smallest of the diagonal branches and had a 70% stenosis with the target and the conduit were good quality vessels.  The anastomosis was performed with a running 7-0 Prolene suture. Again, there was good flow through the graft and cardioplegia was administered down all three vein grafts.  Next, the left internal mammary artery was brought through a window in the pericardium anterior to the left phrenic nerve.  The distal end was spatulated in preparation for the anastomosis to the distal LAD.  The LAD was a good quality vessel as was the mammary artery.  The anastomosis was performed end-to-side using a running 8-0 Prolene suture.  At completion of the mammary to LAD anastomosis, the bulldog clamp was removed from the mammary artery and immediate and rapid septal rewarming was noted.  Lidocaine was administered. The  mammary and pedicle were tacked to the epicardial surface of the heart  with interrupted 6-0 Prolene sutures.  Total cross-clamp time was 58 minutes.  A partial occlusion clamp was placed on the ascending aorta.  The cardioplegia cannula was removed.  Proximal vein graft anastomoses were performed with 4.4 mm punch aortotomies with running 6-0 Prolene sutures.  At the completion of the final anastomosis and before tying the suture, the patient was placed in the Trendelenburg position.  As the partial clamp was removed, air was allowed to escape before tying the suture.  Air was then aspirated from each of the vein grafts.  The bulldog clamps were removed and flow was restored. All proximal and distal anastomoses were inspected for hemostasis.  The patient was rewarmed.  Epicardial pacing wires were placed on the right ventricle and right atrium.  When the core temperature reached 37 degrees Celsius, the patient was weaned from cardiopulmonary bypass.  Total bypass time was 121 minutes.  The patient weaned from bypass without difficulty.  The patient was on no inotropic support at the time of separation from bypass.  A  test dose of Protamine was administered and was well-tolerated.  The atrial and aortic cannulae were removed.  There was good hemostasis at both cannulation sites.  The remainder of the Protamine was administered without incident.  The patient was given an additional dose of antibiotics.  The chest was irrigated with 1 L of warm normal saline containing 1 g of vancomycin.  Hemostasis was achieved.  The pericardium was reapproximated over the heart and aorta with interrupted 3-0 silk sutures.  A left pleural and two mediastinal chest tubes were placed through separate subcostal incisions and secured with #1 silk sutures.  The sternum was closed with interrupted heavy gauge stainless steel wire.  The pectoralis fascia was closed with a running #1 Vicryl suture, and both  the chest and leg subcutaneous tissue was closed with a running 2-0 Vicryl suture, and the skin was closed with a running 3-0 Vicryl subcuticular suture.  All sponge, needle, and instrument counts were correct at the end of the procedure.  There were no intraoperative complications.  The patient was taken from the operating room to the surgical intensive care unit intubated and in stable condition. DD:  07/02/00 TD:  07/02/00 Job: [id number]

## 2011-05-16 NOTE — Cardiovascular Report (Signed)
[place]. [facility]  Patient:    Wayne Williams, Wayne Williams                       MRN: [id number] Proc. Date: 05/21/00 Adm. Date:  [id number] Attending:  [name] CC:         Robert Bellow, M.D.             [name], M.D. [facility]             Cardiac Catheterization Laboratory                        Cardiac Catheterization  PROCEDURES PERFORMED:  Left heart catheterization with coronary angiography and left ventriculography.  INDICATIONS:  [name] is a 60 year old male, who presented with chest pain and palpitations and was found to be in sustained ventricular tachycardial, which converted with lidocaine.  After stabilization of medical therapy we opted to proceed with cardiac catheterization to rule out coronary artery disease as the etiology of his ventricular tachycardia.  DESCRIPTION OF PROCEDURE:  A 6 French sheath was placed in the right femoral artery.  Standard Judkins 6 French catheters were utilized.  Contrast was Omnipaque.  At the conclusion of the procedure a Perclose vascular closure device was placed in the right femoral artery with good hemostasis.  There were no complications.  RESULTS:  HEMODYNAMICS:  Left ventricular pressure 122/10.  Aortic pressure 132/88. There was no aortic valve gradient.  LEFT VENTRICULOGRAM:  There is mild to moderate global left ventricular hypokinesis.  Ejection fraction calculated at 42%.  No mitral regurgitation.  CORONARY ARTERIOGRAPHY:  (Right dominant).  Left main:  Left main has a distal 20% stenosis.  Left anterior descending:  The LAD has a tubular 80% stenosis at its ostium. The proximal vessel has a 75% stenosis followed by an 80% stenosis in the mid vessel, followed by a 70% stenosis more distally.  There is a small first diagonal and a large second diagonal.  The second diagonal has a 70% stenosis proximally.  Left circumflex:  The left circumflex coronary gives rise to a small  OM-1, large OM-2 and a small to normal sized OM-3.  OM-2 has an 80% stenosis proximally followed by a 25% stenosis.  Right coronary artery:  The right coronary artery gives rise to a normal sized posterior descending artery and two small posterolateral branches.  There is a 75% stenosis in the proximal right coronary artery.  The posterior descending artery has a proximal 50% stenosis followed by a 75% stenosis in the mid vessel.  IMPRESSION: 1. Mild to moderately decreased left ventricular systolic function with    global left ventricular hypokinesis. 2. Significant three-vessel coronary artery disease as described including    a complex ostial left anterior descending stenosis.  PLAN:  Cardiovascular surgery will be consulted.  Electrophysiology studies will be delayed until after the patient is revascularized. DD:  05/21/00 TD:  05/23/00 Job: [id number]

## 2011-05-16 NOTE — Discharge Summary (Signed)
[place]. [facility]  Patient:    Wayne Williams, Wayne Williams                       MRN: [id number] Adm. Date:  [id number] Disc. Date: 05/25/00 Attending:  Charlett Williams Dictator:   Sherrie George, P.A. CC:         Wayne Williams. Wayne Williams, M.D. at office             Wayne Williams, M.D. [facility]                  Referring Physician Discharge [place]  DATE OF BIRTH:  [birth year]  ADMISSION DIAGNOSES: 1. Wide complex tachycardia with chest pain and shortness of breath, rule out    myocardial infarction. 2. History of back surgery. 3. History of heavy tobacco use, two packs per day, ongoing. 4. Positive family history. 5. History of cocaine and ______ usage with positive drug screen on    admission.  DISCHARGE DIAGNOSES: 1. Severe three-vessel coronary artery disease with progressive angina. 2. History of back surgery. 3. Ongoing heavy tobacco use. 4. Positive family history. 5. History positive for cocaine and ______ usage.  PROCEDURES: 1. Cardiac catheterization on May 21, 2000 - Dr. Antoine Williams. 2. Coronary artery bypass grafting x 5 with left internal mammary artery to    the left anterior descending artery, saphenous vein graft to the obtuse    marginal, saphenous vein graft to the diagonal, saphenous vein graft to    the right coronary artery - posterior descending artery sequentially on    May 22, 2000.  BRIEF HISTORY:  The patient is a 60 year old white male with no prior history of cardiac disease who developed chest pain at work along with shortness of breath and a fast heart rate.  He was taken to Urgent Care where EKG showed a wide complex tachycardia with a rate of between 170-180.  He converted to sinus rhythm with Xylocaine bolus.  He had a similar episode approximately one month ago while at work, which lasted approximately 30 minutes.  MEDICATIONS ON ADMISSION:  Xanax 0.5 mg h.s. by Dr. Bunnie Williams.  PAST MEDICAL HISTORY:  Includes cervical and  low back surgery for disk problems.  No history of hypertension, murmur, or hypothyroidism.  Family history is positive for coronary artery disease.  SOCIAL HISTORY:  He smokes two packs a day for 40 years.  He uses marijuana and cocaine.  For further history and physical, please see the dictated note.  HOSPITAL COURSE:  Patient was admitted to rule out MI and a possible EPS study.  He remained hemodynamically stable.  Family asked that the patient have use of illegal drugs and smoking discussed with the patient.  Admission drug screen was positive for cocaine and ______.  This was done by Wayne Williams.  Patient was fairly agitated throughout his initial preoperative course.  He underwent cardiac catheterization on May 21, 2000.  This showed 75% stenosis of the proximal right and up to 75% of the distal right.  He had a 20% left main, 80% ostial of the LAD, first diagonal had a 70% stenosis. After reviewing the studies, it was Dr. Lindaann Williams opinion the patient should undergo coronary artery bypass grafting.  The risks and benefits were discussed.  The patient was seen in consultation by Dr. Charlett Williams. It was his impression that patient was a very anxious and angry gentleman with significant coronary artery disease.  It was his opinion the  patient would need grafts to the LAD, the first diagonal, the second obtuse marginal, the distal right, and the distal.  The risks and benefits were discussed in detail and informed operative consent was obtained.  On May 22, 2000 he was taken to the operating room and underwent coronary artery bypass grafting as described above.  He had a left internal mammary to the LAD, saphenous vein graft to the OM, saphenous vein graft to the diagonal, and saphenous vein graft to the right coronary artery - PDA sequentially.  The patient spiked a temperature to 102 postoperatively.  He was extubated and neurologically intact.  Postoperative morning #1,  he was doing well.  He had some anemia postoperatively with hemoglobin 10, hematocrit 29.  Overall, he was stable.  He was transferred to the floor.  Postoperative day #1, he was alert, neurologically intact, had multiple visitors.  Labs showed sodium 137, potassium 3.7, chloride 107, HCO3 30, BUN 22, creatinine ______, glucose 100. Patient had a poor cough and rales in his left base but overall looked good. He was seen in consultation by Dr. Charlett Williams, who after evaluation felt that patient was a candidate for discharge home in the a.m. May 25, 2000.   DISCHARGE MEDICATIONS: 1. Lasix 40 mg q.d. 2. Potassium chloride 20 mEq q.d. 3. Lopressor 50 mg one-half tablet b.i.d. 4. Ecotrin 81 mg q.d.  DISCHARGE ACTIVITY:  Light to moderate, no lifting over 10 pounds, no driving, no strenuous activity.  SPECIAL INSTRUCTIONS:  He has been urged to discontinue smoking and using street drugs.  DIET:  He is to maintain a low fat/low salt diet.  FOLLOW-UP:  He was given cardiac surgery fact sheets. He is to return to see Dr. Antoine Williams in one week and will return to see Dr. Cornelius Williams in three weeks.  CONDITION ON DISCHARGE:  Improving. DD:  05/24/00 TD:  05/24/00 Job: [id number]

## 2011-06-12 ENCOUNTER — Other Ambulatory Visit: Payer: Self-pay | Admitting: Family Medicine

## 2011-06-30 ENCOUNTER — Other Ambulatory Visit: Payer: Self-pay | Admitting: Family Medicine

## 2011-07-01 ENCOUNTER — Telehealth: Payer: Self-pay | Admitting: *Deleted

## 2011-07-01 MED ORDER — METOPROLOL TARTRATE 50 MG PO TABS: ORAL_TABLET | ORAL | Status: DC

## 2011-07-01 NOTE — Telephone Encounter (Signed)
Received request from [facility] requesting #90 of Lopressor as pt takes 1 1/2 tablets twice a day. #90 sent to pharmacy.

## 2011-08-28 ENCOUNTER — Other Ambulatory Visit: Payer: Self-pay | Admitting: Family Medicine

## 2011-08-28 NOTE — Telephone Encounter (Signed)
[facility] pharmacy called about pt's metoprolol that Arline Asp called in today. It was called in for 30 pills which won't last a whole month, as the SIG is 1.5 tabs daily. Please call in for 90 tabs; looks like that's what we called it in for in July. Thanks!

## 2011-08-29 MED ORDER — METOPROLOL TARTRATE 50 MG PO TABS: 50.0000 mg | ORAL_TABLET | Freq: Two times a day (BID) | ORAL | Status: DC

## 2011-08-29 NOTE — Telephone Encounter (Signed)
I called in 90 tablets and pt aware.

## 2011-09-19 ENCOUNTER — Telehealth: Payer: Self-pay | Admitting: *Deleted

## 2011-09-19 NOTE — Telephone Encounter (Signed)
Pt is calling for an appt with Dr. Clent Ridges Monday am for dry mouth and SOB.  Discussed at length what to look for re: SOB, and where to go.  Advised ER if any chest pain, jaw or arm pain, sweating or what feels like his previous MI.  If no better in the am, call the Saturday clinic for evaluation.  Otherwise, he was made appt here for Monday if symptoms get no worse or change.

## 2011-09-21 ENCOUNTER — Inpatient Hospital Stay (HOSPITAL_COMMUNITY): Payer: Medicare Other

## 2011-09-21 ENCOUNTER — Emergency Department (HOSPITAL_COMMUNITY): Payer: Medicare Other

## 2011-09-21 ENCOUNTER — Inpatient Hospital Stay (HOSPITAL_COMMUNITY)
Admission: EM | Admit: 2011-09-21 | Discharge: 2011-09-26 | DRG: 637 | Disposition: A | Discharge status: Home / Self Care | Payer: Medicare Other | Attending: Internal Medicine | Admitting: Internal Medicine

## 2011-09-21 DIAGNOSIS — E872 Acidosis: Secondary | ICD-10-CM

## 2011-09-21 DIAGNOSIS — Z7902 Long term (current) use of antithrombotics/antiplatelets: Secondary | ICD-10-CM

## 2011-09-21 DIAGNOSIS — Z79899 Other long term (current) drug therapy: Secondary | ICD-10-CM

## 2011-09-21 DIAGNOSIS — F411 Generalized anxiety disorder: Secondary | ICD-10-CM | POA: Diagnosis present

## 2011-09-21 DIAGNOSIS — E861 Hypovolemia: Secondary | ICD-10-CM | POA: Diagnosis not present

## 2011-09-21 DIAGNOSIS — F29 Unspecified psychosis not due to a substance or known physiological condition: Secondary | ICD-10-CM | POA: Diagnosis not present

## 2011-09-21 DIAGNOSIS — I10 Essential (primary) hypertension: Secondary | ICD-10-CM | POA: Diagnosis present

## 2011-09-21 DIAGNOSIS — N179 Acute kidney failure, unspecified: Secondary | ICD-10-CM

## 2011-09-21 DIAGNOSIS — G9341 Metabolic encephalopathy: Secondary | ICD-10-CM | POA: Diagnosis not present

## 2011-09-21 DIAGNOSIS — D72829 Elevated white blood cell count, unspecified: Secondary | ICD-10-CM | POA: Diagnosis present

## 2011-09-21 DIAGNOSIS — Z8711 Personal history of peptic ulcer disease: Secondary | ICD-10-CM

## 2011-09-21 DIAGNOSIS — G4700 Insomnia, unspecified: Secondary | ICD-10-CM | POA: Diagnosis not present

## 2011-09-21 DIAGNOSIS — Z951 Presence of aortocoronary bypass graft: Secondary | ICD-10-CM

## 2011-09-21 DIAGNOSIS — IMO0002 Reserved for concepts with insufficient information to code with codable children: Secondary | ICD-10-CM | POA: Diagnosis not present

## 2011-09-21 DIAGNOSIS — I498 Other specified cardiac arrhythmias: Secondary | ICD-10-CM | POA: Diagnosis present

## 2011-09-21 DIAGNOSIS — F17200 Nicotine dependence, unspecified, uncomplicated: Secondary | ICD-10-CM | POA: Diagnosis present

## 2011-09-21 DIAGNOSIS — E1310 Other specified diabetes mellitus with ketoacidosis without coma: Principal | ICD-10-CM | POA: Diagnosis present

## 2011-09-21 DIAGNOSIS — E7800 Pure hypercholesterolemia, unspecified: Secondary | ICD-10-CM | POA: Diagnosis present

## 2011-09-21 DIAGNOSIS — F1910 Other psychoactive substance abuse, uncomplicated: Secondary | ICD-10-CM | POA: Diagnosis present

## 2011-09-21 DIAGNOSIS — E1101 Type 2 diabetes mellitus with hyperosmolarity with coma: Secondary | ICD-10-CM

## 2011-09-21 DIAGNOSIS — E876 Hypokalemia: Secondary | ICD-10-CM | POA: Diagnosis present

## 2011-09-21 DIAGNOSIS — I252 Old myocardial infarction: Secondary | ICD-10-CM

## 2011-09-21 DIAGNOSIS — Z8673 Personal history of transient ischemic attack (TIA), and cerebral infarction without residual deficits: Secondary | ICD-10-CM

## 2011-09-21 DIAGNOSIS — I2510 Atherosclerotic heart disease of native coronary artery without angina pectoris: Secondary | ICD-10-CM | POA: Diagnosis present

## 2011-09-21 LAB — URINE MICROSCOPIC-ADD ON

## 2011-09-21 LAB — PROCALCITONIN
Procalcitonin: 0.19 ng/mL
Procalcitonin: 0.42 ng/mL

## 2011-09-21 LAB — GLUCOSE, CAPILLARY
Glucose-Capillary: >600 mg/dL (ref 70–99)
Glucose-Capillary: >600 mg/dL (ref 70–99)
Glucose-Capillary: >600 mg/dL (ref 70–99)
Glucose-Capillary: >600 mg/dL (ref 70–99)
Glucose-Capillary: >600 mg/dL (ref 70–99)
Glucose-Capillary: >600 mg/dL (ref 70–99)

## 2011-09-21 LAB — BLOOD GAS, ARTERIAL
Acid-base deficit: 12.7 mmol/L — ABNORMAL HIGH (ref 0.0–2.0)
Bicarbonate: 11.7 mEq/L — ABNORMAL LOW (ref 20.0–24.0)
Drawn by: 317871
O2 Content: 3 L/min
O2 Saturation: 99.2 %
Patient temperature: 98.6
TCO2: 10.4 mmol/L (ref 0–100)
pCO2 arterial: 24 mmHg — ABNORMAL LOW (ref 35.0–45.0)
pH, Arterial: 7.309 — ABNORMAL LOW (ref 7.350–7.450)
pO2, Arterial: 154 mmHg — ABNORMAL HIGH (ref 80.0–100.0)

## 2011-09-21 LAB — URINALYSIS, ROUTINE W REFLEX MICROSCOPIC
Glucose, UA: >1000 mg/dL — AB
Ketones, ur: 40 mg/dL — AB
Leukocytes, UA: NEGATIVE
Nitrite: NEGATIVE
Protein, ur: 30 mg/dL — AB
Specific Gravity, Urine: 1.027 (ref 1.005–1.030)
Urobilinogen, UA: 0.2 mg/dL (ref 0.0–1.0)
pH: 5.5 (ref 5.0–8.0)

## 2011-09-21 LAB — CBC
HCT: 48.7 % (ref 39.0–52.0)
Hemoglobin: 16.1 g/dL (ref 13.0–17.0)
MCH: 31.9 pg (ref 26.0–34.0)
MCHC: 33.1 g/dL (ref 30.0–36.0)
MCV: 96.6 fL (ref 78.0–100.0)
Platelets: 303 10*3/uL (ref 150–400)
RBC: 5.04 MIL/uL (ref 4.22–5.81)
RDW: 13.7 % (ref 11.5–15.5)
WBC: 20.1 10*3/uL — ABNORMAL HIGH (ref 4.0–10.5)

## 2011-09-21 LAB — DIFFERENTIAL
Basophils Absolute: 0 10*3/uL (ref 0.0–0.1)
Basophils Relative: 0 % (ref 0–1)
Eosinophils Absolute: 0 10*3/uL (ref 0.0–0.7)
Eosinophils Relative: 0 % (ref 0–5)
Lymphocytes Relative: 5 % — ABNORMAL LOW (ref 12–46)
Lymphs Abs: 1 10*3/uL (ref 0.7–4.0)
Monocytes Absolute: 1 10*3/uL (ref 0.1–1.0)
Monocytes Relative: 5 % (ref 3–12)
Neutro Abs: 18.1 10*3/uL — ABNORMAL HIGH (ref 1.7–7.7)
Neutrophils Relative %: 90 % — ABNORMAL HIGH (ref 43–77)

## 2011-09-21 LAB — COMPREHENSIVE METABOLIC PANEL
ALT: 12 U/L (ref 0–53)
AST: 11 U/L (ref 0–37)
Albumin: 3.4 g/dL — ABNORMAL LOW (ref 3.5–5.2)
Alkaline Phosphatase: 85 U/L (ref 39–117)
BUN: 67 mg/dL — ABNORMAL HIGH (ref 6–23)
CO2: 7 mEq/L — CL (ref 19–32)
Calcium: 9.3 mg/dL (ref 8.4–10.5)
Chloride: 93 mEq/L — ABNORMAL LOW (ref 96–112)
Creatinine, Ser: 3.17 mg/dL — ABNORMAL HIGH (ref 0.50–1.35)
GFR calc Af Amer: 24 mL/min — ABNORMAL LOW (ref >60)
GFR calc non Af Amer: 20 mL/min — ABNORMAL LOW (ref >60)
Glucose, Bld: 956 mg/dL (ref 70–99)
Potassium: 3.3 mEq/L — ABNORMAL LOW (ref 3.5–5.1)
Sodium: 142 mEq/L (ref 135–145)
Total Bilirubin: 0.2 mg/dL — ABNORMAL LOW (ref 0.3–1.2)
Total Protein: 6.9 g/dL (ref 6.0–8.3)

## 2011-09-21 LAB — PROTIME-INR
INR: 1.12 (ref 0.00–1.49)
INR: 1.09 (ref 0.00–1.49)
Prothrombin Time: 14.6 seconds (ref 11.6–15.2)
Prothrombin Time: 14.3 seconds (ref 11.6–15.2)

## 2011-09-21 LAB — BASIC METABOLIC PANEL
BUN: 64 mg/dL — ABNORMAL HIGH (ref 6–23)
CO2: 16 mEq/L — ABNORMAL LOW (ref 19–32)
Calcium: 8.7 mg/dL (ref 8.4–10.5)
Chloride: 106 mEq/L (ref 96–112)
Creatinine, Ser: 2.89 mg/dL — ABNORMAL HIGH (ref 0.50–1.35)
GFR calc Af Amer: 27 mL/min — ABNORMAL LOW (ref >60)
GFR calc non Af Amer: 22 mL/min — ABNORMAL LOW (ref >60)
Glucose, Bld: 613 mg/dL (ref 70–99)
Potassium: 2.2 mEq/L — CL (ref 3.5–5.1)
Sodium: 147 mEq/L — ABNORMAL HIGH (ref 135–145)

## 2011-09-21 LAB — POCT I-STAT TROPONIN I: Troponin i, poc: 0.03 ng/mL (ref 0.00–0.08)

## 2011-09-21 LAB — CARDIAC PANEL(CRET KIN+CKTOT+MB+TROPI)
CK, MB: 3.9 ng/mL (ref 0.3–4.0)
Relative Index: INVALID (ref 0.0–2.5)
Total CK: 76 U/L (ref 7–232)
Troponin I: <0.3 ng/mL (ref <0.30)

## 2011-09-21 LAB — MRSA PCR SCREENING: MRSA by PCR: NEGATIVE

## 2011-09-21 LAB — PRO B NATRIURETIC PEPTIDE: Pro B Natriuretic peptide (BNP): 1061 pg/mL — ABNORMAL HIGH (ref 0–125)

## 2011-09-21 LAB — LIPASE, BLOOD: Lipase: 577 U/L — ABNORMAL HIGH (ref 11–59)

## 2011-09-21 LAB — AMYLASE: Amylase: 301 U/L — ABNORMAL HIGH (ref 0–105)

## 2011-09-21 LAB — LACTIC ACID, PLASMA
Lactic Acid, Venous: 5.6 mmol/L — ABNORMAL HIGH (ref 0.5–2.2)
Lactic Acid, Venous: 1.5 mmol/L (ref 0.5–2.2)

## 2011-09-21 LAB — APTT: aPTT: 25 seconds (ref 24–37)

## 2011-09-22 ENCOUNTER — Inpatient Hospital Stay (HOSPITAL_COMMUNITY): Payer: Medicare Other

## 2011-09-22 ENCOUNTER — Ambulatory Visit: Payer: Self-pay | Admitting: Family Medicine

## 2011-09-22 DIAGNOSIS — I359 Nonrheumatic aortic valve disorder, unspecified: Secondary | ICD-10-CM

## 2011-09-22 LAB — BASIC METABOLIC PANEL
BUN: 53 mg/dL — ABNORMAL HIGH (ref 6–23)
BUN: 42 mg/dL — ABNORMAL HIGH (ref 6–23)
BUN: 37 mg/dL — ABNORMAL HIGH (ref 6–23)
BUN: 30 mg/dL — ABNORMAL HIGH (ref 6–23)
CO2: 22 mEq/L (ref 19–32)
CO2: 25 mEq/L (ref 19–32)
CO2: 21 mEq/L (ref 19–32)
CO2: 21 mEq/L (ref 19–32)
Calcium: 8.1 mg/dL — ABNORMAL LOW (ref 8.4–10.5)
Calcium: 7.8 mg/dL — ABNORMAL LOW (ref 8.4–10.5)
Calcium: 7.2 mg/dL — ABNORMAL LOW (ref 8.4–10.5)
Calcium: 7.1 mg/dL — ABNORMAL LOW (ref 8.4–10.5)
Chloride: 119 mEq/L — ABNORMAL HIGH (ref 96–112)
Chloride: 120 mEq/L — ABNORMAL HIGH (ref 96–112)
Chloride: 116 mEq/L — ABNORMAL HIGH (ref 96–112)
Chloride: 110 mEq/L (ref 96–112)
Creatinine, Ser: 2.26 mg/dL — ABNORMAL HIGH (ref 0.50–1.35)
Creatinine, Ser: 1.83 mg/dL — ABNORMAL HIGH (ref 0.50–1.35)
Creatinine, Ser: 1.59 mg/dL — ABNORMAL HIGH (ref 0.50–1.35)
Creatinine, Ser: 1.41 mg/dL — ABNORMAL HIGH (ref 0.50–1.35)
GFR calc Af Amer: 36 mL/min — ABNORMAL LOW (ref >60)
GFR calc Af Amer: 46 mL/min — ABNORMAL LOW (ref >60)
GFR calc Af Amer: 54 mL/min — ABNORMAL LOW (ref >60)
GFR calc Af Amer: >60 mL/min (ref >60)
GFR calc non Af Amer: 30 mL/min — ABNORMAL LOW (ref >60)
GFR calc non Af Amer: 38 mL/min — ABNORMAL LOW (ref >60)
GFR calc non Af Amer: 44 mL/min — ABNORMAL LOW (ref >60)
GFR calc non Af Amer: 51 mL/min — ABNORMAL LOW (ref >60)
Glucose, Bld: 266 mg/dL — ABNORMAL HIGH (ref 70–99)
Glucose, Bld: 116 mg/dL — ABNORMAL HIGH (ref 70–99)
Glucose, Bld: 214 mg/dL — ABNORMAL HIGH (ref 70–99)
Glucose, Bld: 369 mg/dL — ABNORMAL HIGH (ref 70–99)
Potassium: 2.4 mEq/L — CL (ref 3.5–5.1)
Potassium: 2.2 mEq/L — CL (ref 3.5–5.1)
Potassium: 2.9 mEq/L — ABNORMAL LOW (ref 3.5–5.1)
Potassium: 3.1 mEq/L — ABNORMAL LOW (ref 3.5–5.1)
Sodium: 153 mEq/L — ABNORMAL HIGH (ref 135–145)
Sodium: 151 mEq/L — ABNORMAL HIGH (ref 135–145)
Sodium: 148 mEq/L — ABNORMAL HIGH (ref 135–145)
Sodium: 140 mEq/L (ref 135–145)

## 2011-09-22 LAB — GLUCOSE, CAPILLARY
Glucose-Capillary: 186 mg/dL — ABNORMAL HIGH (ref 70–99)
Glucose-Capillary: 219 mg/dL — ABNORMAL HIGH (ref 70–99)
Glucose-Capillary: 236 mg/dL — ABNORMAL HIGH (ref 70–99)
Glucose-Capillary: 246 mg/dL — ABNORMAL HIGH (ref 70–99)
Glucose-Capillary: 259 mg/dL — ABNORMAL HIGH (ref 70–99)
Glucose-Capillary: 348 mg/dL — ABNORMAL HIGH (ref 70–99)
Glucose-Capillary: 370 mg/dL — ABNORMAL HIGH (ref 70–99)
Glucose-Capillary: 475 mg/dL — ABNORMAL HIGH (ref 70–99)
Glucose-Capillary: >600 mg/dL (ref 70–99)
Glucose-Capillary: 109 mg/dL — ABNORMAL HIGH (ref 70–99)
Glucose-Capillary: 82 mg/dL (ref 70–99)
Glucose-Capillary: 85 mg/dL (ref 70–99)
Glucose-Capillary: 164 mg/dL — ABNORMAL HIGH (ref 70–99)
Glucose-Capillary: 317 mg/dL — ABNORMAL HIGH (ref 70–99)
Glucose-Capillary: 340 mg/dL — ABNORMAL HIGH (ref 70–99)
Glucose-Capillary: 346 mg/dL — ABNORMAL HIGH (ref 70–99)
Glucose-Capillary: 307 mg/dL — ABNORMAL HIGH (ref 70–99)

## 2011-09-22 LAB — COMPREHENSIVE METABOLIC PANEL
ALT: 9 U/L (ref 0–53)
AST: 11 U/L (ref 0–37)
Albumin: 2.8 g/dL — ABNORMAL LOW (ref 3.5–5.2)
Alkaline Phosphatase: 60 U/L (ref 39–117)
BUN: 46 mg/dL — ABNORMAL HIGH (ref 6–23)
CO2: 24 mEq/L (ref 19–32)
Calcium: 8 mg/dL — ABNORMAL LOW (ref 8.4–10.5)
Chloride: 121 mEq/L — ABNORMAL HIGH (ref 96–112)
Creatinine, Ser: 1.99 mg/dL — ABNORMAL HIGH (ref 0.50–1.35)
GFR calc Af Amer: 41 mL/min — ABNORMAL LOW (ref >60)
GFR calc non Af Amer: 34 mL/min — ABNORMAL LOW (ref >60)
Glucose, Bld: 202 mg/dL — ABNORMAL HIGH (ref 70–99)
Potassium: 2.3 mEq/L — CL (ref 3.5–5.1)
Sodium: 153 mEq/L — ABNORMAL HIGH (ref 135–145)
Total Bilirubin: 0.2 mg/dL — ABNORMAL LOW (ref 0.3–1.2)
Total Protein: 5.3 g/dL — ABNORMAL LOW (ref 6.0–8.3)

## 2011-09-22 LAB — CARDIAC PANEL(CRET KIN+CKTOT+MB+TROPI)
CK, MB: 3.8 ng/mL (ref 0.3–4.0)
CK, MB: 4.5 ng/mL — ABNORMAL HIGH (ref 0.3–4.0)
Relative Index: 3 — ABNORMAL HIGH (ref 0.0–2.5)
Relative Index: 2.4 (ref 0.0–2.5)
Total CK: 127 U/L (ref 7–232)
Total CK: 185 U/L (ref 7–232)
Troponin I: 0.53 ng/mL (ref <0.30)
Troponin I: 0.48 ng/mL (ref <0.30)

## 2011-09-22 LAB — CBC
HCT: 33.2 % — ABNORMAL LOW (ref 39.0–52.0)
Hemoglobin: 12 g/dL — ABNORMAL LOW (ref 13.0–17.0)
MCH: 31.6 pg (ref 26.0–34.0)
MCHC: 36.1 g/dL — ABNORMAL HIGH (ref 30.0–36.0)
MCV: 87.4 fL (ref 78.0–100.0)
Platelets: 191 10*3/uL (ref 150–400)
RBC: 3.8 MIL/uL — ABNORMAL LOW (ref 4.22–5.81)
RDW: 12.9 % (ref 11.5–15.5)
WBC: 13.4 10*3/uL — ABNORMAL HIGH (ref 4.0–10.5)

## 2011-09-22 LAB — TSH: TSH: 0.145 u[IU]/mL — ABNORMAL LOW (ref 0.350–4.500)

## 2011-09-22 LAB — LIPID PANEL
Cholesterol: 159 mg/dL (ref 0–200)
HDL: 21 mg/dL — ABNORMAL LOW (ref >39)
LDL Cholesterol: UNDETERMINED mg/dL (ref 0–99)
Total CHOL/HDL Ratio: 7.6 RATIO
Triglycerides: 469 mg/dL — ABNORMAL HIGH (ref <150)
VLDL: UNDETERMINED mg/dL (ref 0–40)

## 2011-09-22 LAB — URINE CULTURE
Colony Count: NO GROWTH
Culture  Setup Time: 201209232102
Culture: NO GROWTH

## 2011-09-22 LAB — LIPASE, BLOOD: Lipase: 98 U/L — ABNORMAL HIGH (ref 11–59)

## 2011-09-22 LAB — MAGNESIUM: Magnesium: 2.1 mg/dL (ref 1.5–2.5)

## 2011-09-22 LAB — HEMOGLOBIN A1C
Hgb A1c MFr Bld: 15.4 % — ABNORMAL HIGH (ref <5.7)
Mean Plasma Glucose: 395 mg/dL — ABNORMAL HIGH (ref <117)

## 2011-09-22 NOTE — Telephone Encounter (Signed)
noted 

## 2011-09-23 ENCOUNTER — Inpatient Hospital Stay (HOSPITAL_COMMUNITY): Payer: Medicare Other

## 2011-09-23 DIAGNOSIS — R404 Transient alteration of awareness: Secondary | ICD-10-CM

## 2011-09-23 DIAGNOSIS — N179 Acute kidney failure, unspecified: Secondary | ICD-10-CM

## 2011-09-23 DIAGNOSIS — E1165 Type 2 diabetes mellitus with hyperglycemia: Secondary | ICD-10-CM

## 2011-09-23 DIAGNOSIS — F29 Unspecified psychosis not due to a substance or known physiological condition: Secondary | ICD-10-CM

## 2011-09-23 DIAGNOSIS — IMO0001 Reserved for inherently not codable concepts without codable children: Secondary | ICD-10-CM

## 2011-09-23 LAB — COMPREHENSIVE METABOLIC PANEL
ALT: 13 U/L (ref 0–53)
AST: 20 U/L (ref 0–37)
Albumin: 2.6 g/dL — ABNORMAL LOW (ref 3.5–5.2)
Alkaline Phosphatase: 59 U/L (ref 39–117)
BUN: 22 mg/dL (ref 6–23)
CO2: 21 mEq/L (ref 19–32)
Calcium: 7.1 mg/dL — ABNORMAL LOW (ref 8.4–10.5)
Chloride: 108 mEq/L (ref 96–112)
Creatinine, Ser: 1.18 mg/dL (ref 0.50–1.35)
GFR calc Af Amer: >60 mL/min (ref >60)
GFR calc non Af Amer: >60 mL/min (ref >60)
Glucose, Bld: 358 mg/dL — ABNORMAL HIGH (ref 70–99)
Potassium: 3.6 mEq/L (ref 3.5–5.1)
Sodium: 139 mEq/L (ref 135–145)
Total Bilirubin: 0.3 mg/dL (ref 0.3–1.2)
Total Protein: 5.4 g/dL — ABNORMAL LOW (ref 6.0–8.3)

## 2011-09-23 LAB — GLUCOSE, CAPILLARY
Glucose-Capillary: 378 mg/dL — ABNORMAL HIGH (ref 70–99)
Glucose-Capillary: 267 mg/dL — ABNORMAL HIGH (ref 70–99)
Glucose-Capillary: 224 mg/dL — ABNORMAL HIGH (ref 70–99)

## 2011-09-23 LAB — CBC
HCT: 30.1 % — ABNORMAL LOW (ref 39.0–52.0)
Hemoglobin: 10.4 g/dL — ABNORMAL LOW (ref 13.0–17.0)
MCH: 30.7 pg (ref 26.0–34.0)
MCHC: 34.6 g/dL (ref 30.0–36.0)
MCV: 88.8 fL (ref 78.0–100.0)
Platelets: 126 10*3/uL — ABNORMAL LOW (ref 150–400)
RBC: 3.39 MIL/uL — ABNORMAL LOW (ref 4.22–5.81)
RDW: 13.9 % (ref 11.5–15.5)
WBC: 9.9 10*3/uL (ref 4.0–10.5)

## 2011-09-23 LAB — MAGNESIUM: Magnesium: 2 mg/dL (ref 1.5–2.5)

## 2011-09-23 LAB — PHOSPHORUS: Phosphorus: 0.6 mg/dL — CL (ref 2.3–4.6)

## 2011-09-24 ENCOUNTER — Inpatient Hospital Stay (HOSPITAL_COMMUNITY): Payer: Medicare Other

## 2011-09-24 LAB — COMPREHENSIVE METABOLIC PANEL
ALT: 12 U/L (ref 0–53)
AST: 17 U/L (ref 0–37)
Albumin: 2.3 g/dL — ABNORMAL LOW (ref 3.5–5.2)
Alkaline Phosphatase: 54 U/L (ref 39–117)
BUN: 15 mg/dL (ref 6–23)
CO2: 24 mEq/L (ref 19–32)
Calcium: 7 mg/dL — ABNORMAL LOW (ref 8.4–10.5)
Chloride: 111 mEq/L (ref 96–112)
Creatinine, Ser: 1.02 mg/dL (ref 0.50–1.35)
GFR calc Af Amer: >60 mL/min (ref >60)
GFR calc non Af Amer: >60 mL/min (ref >60)
Glucose, Bld: 245 mg/dL — ABNORMAL HIGH (ref 70–99)
Potassium: 3.2 mEq/L — ABNORMAL LOW (ref 3.5–5.1)
Sodium: 143 mEq/L (ref 135–145)
Total Bilirubin: 0.4 mg/dL (ref 0.3–1.2)
Total Protein: 4.9 g/dL — ABNORMAL LOW (ref 6.0–8.3)

## 2011-09-24 LAB — CBC
HCT: 28.6 % — ABNORMAL LOW (ref 39.0–52.0)
Hemoglobin: 10.1 g/dL — ABNORMAL LOW (ref 13.0–17.0)
MCH: 31.1 pg (ref 26.0–34.0)
MCHC: 35.3 g/dL (ref 30.0–36.0)
MCV: 88 fL (ref 78.0–100.0)
Platelets: 115 10*3/uL — ABNORMAL LOW (ref 150–400)
RBC: 3.25 MIL/uL — ABNORMAL LOW (ref 4.22–5.81)
RDW: 13.7 % (ref 11.5–15.5)
WBC: 5.9 10*3/uL (ref 4.0–10.5)

## 2011-09-24 LAB — GLUCOSE, CAPILLARY
Glucose-Capillary: 177 mg/dL — ABNORMAL HIGH (ref 70–99)
Glucose-Capillary: 221 mg/dL — ABNORMAL HIGH (ref 70–99)
Glucose-Capillary: 172 mg/dL — ABNORMAL HIGH (ref 70–99)
Glucose-Capillary: 153 mg/dL — ABNORMAL HIGH (ref 70–99)

## 2011-09-24 LAB — MAGNESIUM: Magnesium: 2.1 mg/dL (ref 1.5–2.5)

## 2011-09-24 LAB — PHOSPHORUS: Phosphorus: 2.7 mg/dL (ref 2.3–4.6)

## 2011-09-24 NOTE — Consult Note (Signed)
  NAMEONUR, MORI NO.:  [id number]  MEDICAL RECORD NO.:  [id number]  LOCATION:  1224                         FACILITY:  [facility]  PHYSICIAN:  Eulogio Ditch, MD DATE OF BIRTH:  08-Sep-1951  DATE OF CONSULTATION:  09/23/2011 DATE OF DISCHARGE:                                CONSULTATION   REASON FOR CONSULTATION:  Psychosis, agitation.  HISTORY OF PRESENT ILLNESS:  60 year old Caucasian male who is admitted because of the chief complaint of weakness.  When I saw the patient, the patient is very irritable.  On asking about his Xanax, he told me he is not going to tell me as he do not want to put the doctor in trouble.  He stated that he was taking 2 or 3 mg of Xanax at bedtime and he is without the medication for the last 4 days.  He denied any past psych hospitalization or seen by psychiatrist.  The patient lives by himself and on asking about his daily activities, he told me "it is none of your business."  As per nursing staff, the patient is agitated on and off and is having visual hallucinations.  When I asked the patient about the hallucinations, he denied that.  He also wanted to leave the hospital against medical advice.  I explained to the patient that he probably withdrawing from the benzos and we are here  to help him if he cooperate with [place].  The patient did not wanted to talk further and wanted to leave the hospital.  PAST MEDICAL HISTORY: 1. History of hypertension. 2. Myocardial infarction. 3. Cerebrovascular accident.  SURGICAL HISTORY:  CABG.  HOME MEDICATIONS:  Plavix, metoprolol, simvastatin, hydrochlorothiazide.  MENTAL STATUS EXAMINATION:  The patient is uncooperative and irritable during the interview.  No abnormal movements noticed.  The patient is not logical and goal directed.  He is labile.  Denies hearing voices. He is anxious.  He is alert, awake, oriented to place and person but not to the time.  His memory immediate,  recent, remote poor.  Attention concentration:  Poor.  Abstraction ability:  Poor.  Insight and judgment poor.  DIAGNOSES:  Axis I:  Benzo abuse.  Psychosis not otherwise specified. Axis II:  Deferred. Axis III:  See medical notes. Axis IV:  Unspecified. Axis V:  30.  RECOMMENDATIONS:  The patient can be continued on Haldol p.r.n.  The patient can be also continued on Xanax p.r.n..  The patient can also be started on Librium 25 mg at bedtime to control the withdrawal from the benzos, I will follow up on this patient.  At this time, the patient does not have capacity to make decision to leave AMA.     Eulogio Ditch, MD     SA/MEDQ  D:  09/23/2011  T:  09/23/2011  Job:  [id number]  Electronically Signed by Eulogio Ditch  on 09/24/2011 03:25:12 PM

## 2011-09-25 DIAGNOSIS — E1165 Type 2 diabetes mellitus with hyperglycemia: Secondary | ICD-10-CM

## 2011-09-25 DIAGNOSIS — IMO0001 Reserved for inherently not codable concepts without codable children: Secondary | ICD-10-CM

## 2011-09-25 DIAGNOSIS — R404 Transient alteration of awareness: Secondary | ICD-10-CM

## 2011-09-25 LAB — BASIC METABOLIC PANEL
BUN: 12 mg/dL (ref 6–23)
BUN: 14
CO2: 24 mEq/L (ref 19–32)
CO2: 31
Calcium: 7.5 mg/dL — ABNORMAL LOW (ref 8.4–10.5)
Calcium: 9.1
Chloride: 112 mEq/L (ref 96–112)
Chloride: 100
Creatinine, Ser: 0.92 mg/dL (ref 0.50–1.35)
Creatinine, Ser: 1.05
GFR calc Af Amer: >60 mL/min (ref >60)
GFR calc Af Amer: >60
GFR calc non Af Amer: >60 mL/min (ref >60)
GFR calc non Af Amer: >60
Glucose, Bld: 137 mg/dL — ABNORMAL HIGH (ref 70–99)
Glucose, Bld: 118 — ABNORMAL HIGH
Potassium: 3.7 mEq/L (ref 3.5–5.1)
Potassium: 3.8
Sodium: 144 mEq/L (ref 135–145)
Sodium: 137

## 2011-09-25 LAB — CBC
HCT: 49.6
HCT: 47.1
Hemoglobin: 17.1 — ABNORMAL HIGH
Hemoglobin: 16.6
MCHC: 34.5
MCHC: 35.3
MCV: 98.4
MCV: 98.7
Platelets: 239
Platelets: 255
RBC: 5.04
RBC: 4.77
RDW: 13.5
RDW: 13.6
WBC: 7.9
WBC: 7.9

## 2011-09-25 LAB — CARDIAC PANEL(CRET KIN+CKTOT+MB+TROPI)
CK, MB: 1.2
Relative Index: 0.9
Total CK: 133
Troponin I: <0.01

## 2011-09-25 LAB — URINALYSIS, ROUTINE W REFLEX MICROSCOPIC
Bilirubin Urine: NEGATIVE
Glucose, UA: NEGATIVE
Ketones, ur: NEGATIVE
Leukocytes, UA: NEGATIVE
Nitrite: NEGATIVE
Protein, ur: 30 — AB
Specific Gravity, Urine: 1.008
Urobilinogen, UA: 0.2
pH: 6.5

## 2011-09-25 LAB — COMPREHENSIVE METABOLIC PANEL
ALT: 22
AST: 25
Albumin: 3.9
Alkaline Phosphatase: 49
BUN: 13
CO2: 32
Calcium: 9.4
Chloride: 104
Creatinine, Ser: 1.08
GFR calc Af Amer: >60
GFR calc non Af Amer: >60
Glucose, Bld: 123 — ABNORMAL HIGH
Potassium: 4.4
Sodium: 145
Total Bilirubin: 0.9
Total Protein: 6.6

## 2011-09-25 LAB — URINE MICROSCOPIC-ADD ON

## 2011-09-25 LAB — APTT: aPTT: 29

## 2011-09-25 LAB — GLUCOSE, CAPILLARY
Glucose-Capillary: 132 mg/dL — ABNORMAL HIGH (ref 70–99)
Glucose-Capillary: 155 mg/dL — ABNORMAL HIGH (ref 70–99)
Glucose-Capillary: 156 mg/dL — ABNORMAL HIGH (ref 70–99)
Glucose-Capillary: 215 mg/dL — ABNORMAL HIGH (ref 70–99)

## 2011-09-25 LAB — LIPID PANEL
Cholesterol: 146
HDL: 24 — ABNORMAL LOW
LDL Cholesterol: 82
Total CHOL/HDL Ratio: 6.1
Triglycerides: 198 — ABNORMAL HIGH
VLDL: 40

## 2011-09-25 LAB — DIFFERENTIAL
Basophils Absolute: 0.1
Basophils Absolute: 0.1
Basophils Relative: 2 — ABNORMAL HIGH
Basophils Relative: 1
Eosinophils Absolute: 0.2
Eosinophils Absolute: 0.2
Eosinophils Relative: 3
Eosinophils Relative: 2
Lymphocytes Relative: 22
Lymphocytes Relative: 36
Lymphs Abs: 1.7
Lymphs Abs: 2.9
Monocytes Absolute: 0.6
Monocytes Absolute: 0.9
Monocytes Relative: 7
Monocytes Relative: 12
Neutro Abs: 5.3
Neutro Abs: 3.9
Neutrophils Relative %: 67
Neutrophils Relative %: 49

## 2011-09-25 LAB — POCT I-STAT, CHEM 8
BUN: 8
Calcium, Ion: 1.12
Chloride: 105
Creatinine, Ser: 1
Glucose, Bld: 109 — ABNORMAL HIGH
HCT: 52
Hemoglobin: 17.7 — ABNORMAL HIGH
Potassium: 4.2
Sodium: 137
TCO2: 26

## 2011-09-25 LAB — HEMOGLOBIN A1C
Hgb A1c MFr Bld: 5.9
Mean Plasma Glucose: 133

## 2011-09-25 LAB — PROTIME-INR
INR: 1
Prothrombin Time: 13

## 2011-09-25 LAB — HOMOCYSTEINE: Homocysteine: 13.4

## 2011-09-26 LAB — BASIC METABOLIC PANEL
BUN: 9 mg/dL (ref 6–23)
CO2: 23 mEq/L (ref 19–32)
Calcium: 8 mg/dL — ABNORMAL LOW (ref 8.4–10.5)
Chloride: 106 mEq/L (ref 96–112)
Creatinine, Ser: 0.94 mg/dL (ref 0.50–1.35)
GFR calc Af Amer: >60 mL/min (ref >60)
GFR calc non Af Amer: >60 mL/min (ref >60)
Glucose, Bld: 113 mg/dL — ABNORMAL HIGH (ref 70–99)
Potassium: 3.4 mEq/L — ABNORMAL LOW (ref 3.5–5.1)
Sodium: 138 mEq/L (ref 135–145)

## 2011-09-26 LAB — GLUCOSE, CAPILLARY
Glucose-Capillary: 235 mg/dL — ABNORMAL HIGH (ref 70–99)
Glucose-Capillary: 197 mg/dL — ABNORMAL HIGH (ref 70–99)

## 2011-09-27 LAB — CULTURE, BLOOD (ROUTINE X 2)
Culture  Setup Time: 201209232103
Culture  Setup Time: 201209232103
Culture: NO GROWTH
Culture: NO GROWTH

## 2011-09-29 ENCOUNTER — Encounter: Payer: Self-pay | Admitting: Family Medicine

## 2011-09-29 LAB — GLUCOSE, CAPILLARY: Glucose-Capillary: 78 mg/dL (ref 70–99)

## 2011-09-30 ENCOUNTER — Encounter: Payer: Self-pay | Admitting: Family Medicine

## 2011-09-30 ENCOUNTER — Ambulatory Visit (INDEPENDENT_AMBULATORY_CARE_PROVIDER_SITE_OTHER): Payer: Medicare Other | Admitting: Family Medicine

## 2011-09-30 VITALS — BP 108/62 | HR 89 | Temp 98.1°F | Wt 171.0 lb

## 2011-09-30 DIAGNOSIS — F411 Generalized anxiety disorder: Secondary | ICD-10-CM

## 2011-09-30 DIAGNOSIS — I2510 Atherosclerotic heart disease of native coronary artery without angina pectoris: Secondary | ICD-10-CM

## 2011-09-30 DIAGNOSIS — I10 Essential (primary) hypertension: Secondary | ICD-10-CM

## 2011-09-30 DIAGNOSIS — E119 Type 2 diabetes mellitus without complications: Secondary | ICD-10-CM

## 2011-09-30 DIAGNOSIS — Z794 Long term (current) use of insulin: Secondary | ICD-10-CM

## 2011-09-30 DIAGNOSIS — F419 Anxiety disorder, unspecified: Secondary | ICD-10-CM

## 2011-09-30 MED ORDER — METOPROLOL TARTRATE 50 MG PO TABS: 50.0000 mg | ORAL_TABLET | Freq: Two times a day (BID) | ORAL | Status: DC

## 2011-09-30 MED ORDER — ALPRAZOLAM 0.5 MG PO TABS: 0.5000 mg | ORAL_TABLET | Freq: Three times a day (TID) | ORAL | Status: DC

## 2011-09-30 MED ORDER — INSULIN ASPART 100 UNIT/ML ~~LOC~~ SOLN: SUBCUTANEOUS | Status: DC

## 2011-10-01 ENCOUNTER — Telehealth: Payer: Self-pay

## 2011-10-01 ENCOUNTER — Encounter: Payer: Self-pay | Admitting: Family Medicine

## 2011-10-01 MED ORDER — METOPROLOL TARTRATE 50 MG PO TABS: 50.0000 mg | ORAL_TABLET | Freq: Two times a day (BID) | ORAL | Status: DC

## 2011-10-01 MED ORDER — INSULIN GLARGINE 100 UNIT/ML ~~LOC~~ SOLN: 35.0000 [IU] | Freq: Every day | SUBCUTANEOUS | Status: DC

## 2011-10-01 NOTE — Progress Notes (Signed)
  Subjective:    Patient ID: Wayne Williams, male    DOB: Nov 17, 1951, 60 y.o.   MRN: [id number]  HPI Here to follow up a hospital stay from 09-21-11 to 09-26-11 for severe nonketotic hyperglycemia from new onset diabetes mellitus. He was severely dehydrated and was in acute renal failure. He has severely hypotensive on admission, and had glucoses in the 600s. His admission A1c was 15. He was rehydrated and treated with insulin. His BP normalized, and his renal function also normalized. He then became quite agitated and psychotic for a few days, and this was felt to be from benzodiazepine  Withdrawal. He has been on Xanax 4 times a day for years. Psychiatry was consulted, and he was treated with Librium in the hospital. The mental status returned to baseline, and he was sent home on Xanax again. He has a hx of alcohol abuse as well, but he swears to me today that he has not touched any form of alcohol in over one year. He was sent home on 28 units of Lantus each morning, and he has been using a sliding scale of Novolog 3 times a day with meals. This has ended up being doses of only 2 or 3 units with meals, because he says he is not very hungry and often skips meals entirely. His glucoses at home, bith fasting and random, have been running from the high 200s to the high 300s. He feels fine now except for some fatigue.    Review of Systems  Constitutional: Positive for fatigue.  Respiratory: Negative.   Cardiovascular: Negative.   Gastrointestinal: Negative.   Neurological: Negative.   Psychiatric/Behavioral: Negative.        Objective:   Physical Exam  Constitutional: He is oriented to person, place, and time. He appears well-developed and well-nourished.  Cardiovascular: Normal rate, regular rhythm, normal heart sounds and intact distal pulses.   Pulmonary/Chest: Effort normal and breath sounds normal.  Neurological: He is alert and oriented to person, place, and time.  Psychiatric: He has a  normal mood and affect. His behavior is normal. Thought content normal.          Assessment & Plan:  New onset DM which is better controlled, but we still have a lot of work to do. I will refer him to Nutrition to teach him about eating healthy meals and snacks, and I stressed to him that he cannot skip meals. We will increase the Lantus dose to 35 units each morning. We will stop the Novolog sliding scale and instead start regular dosing, to give 5 units at breakfast, 5 units at lunch, and 10 units at supper. He will continue to log his glucoses several times a day. He will remain on a standing regimen of Xanax 3 times daily, and he agrees to never exceed this. I wanted to draw some labs today, including a BMET to check his renal status, but he declined. We plan to see him back in 2 weeks and we will check labs then.

## 2011-10-01 NOTE — Telephone Encounter (Signed)
Per Dr. Clent Ridges pt is taking metoprolol 50 mg bid.

## 2011-10-09 NOTE — Discharge Summary (Signed)
Wayne Williams, Wayne [name]:  [id number]  MEDICAL RECORD NO.:  [id number]  LOCATION:                               FACILITY:  [facility]  PHYSICIAN:  Hollice Espy, M.D.DATE OF BIRTH:  1951/08/30  DATE OF ADMISSION:  09/21/2011 DATE OF DISCHARGE:  09/26/2011                              DISCHARGE SUMMARY   ATTENDING PHYSICIAN:  Hollice Espy, M.D.  PRIMARY CARE PHYSICIAN:  Jeannett Senior A. Clent Ridges, MD  CONSULTANTS: 1. Eulogio Ditch, MD, Psychiatry. 2. Kalman Shan, MD, Critical Care Medicine  DISCHARGE DIAGNOSES: 1. Diagnosis of diabetes mellitus type 2 with severe nonketotic     hyperglycemia. 2. Severe nonketotic hyperglycemia with hypotension. 3. Hypotension brought on by dehydration. 4. Dehydration. 5. Acute renal failure, now resolved. 6. Acute psychosis, felt to be brought on by benzodiazepine     withdrawal. 7. History of tobacco abuse. 8. History of alcohol excess. 9. History of coronary artery disease. 10.History of cerebrovascular accident in 2009.  Please note all diagnoses were present on admission with the exception of the patient's psychosis.  Please also note that the patient does not have congestive heart failure  DISCHARGE MEDICATIONS:  Per this patient were as follows.  New medicines: 1. Lantus 28 units subcutaneously daily. 2. NovoLog insulin 6 units t.i.d. with meals.  He has also been     provided a sliding scale to follow up. 3. Xanax 0.5 every 8 hours as needed.  The patient will continue on     his previous medicines hydrochlorothiazide 25 daily. 4. Metoprolol 50 p.o. b.i.d. 5. Plavix 75 p.o. daily. 6. Zocor 40 p.o. daily.  DISPOSITION:  From initial presentation is much improvement.  The patient is being discharged home.  ACTIVITY:  Slowly increased.  DISCHARGE DIET:  Carb-modified heart healthy.  The patient will follow up with PCP, [name] next week.  HOSPITAL COURSE:  The patient is a 60-year white  male with past medical history of CAD and CVA who presented on September 21, 2011, with signs concerning for early sepsis.  He was hypotensive, tachycardic, and has elevated white blood cell count.  He is also noted to have a mildly elevated lipase level.  He was admitted by the Critical Care Service, placed in the intensive care unit, started on IV fluids, initially IV antibiotics, and it was noted that his sugars were markedly elevated in the 600s, although he had no previous history of diabetes mellitus.  In regards to his diabetes mellitus, his A1c was checked and found to be 15 consistent with an average sugar in the 350s.  Despite a minimally elevated lactic acid level, the patient did not have an anion gap and this was felt to be more consistent with nonketotic hyperglycemia.  The patient was aggressively volume resuscitated as well as given IV insulin.  In the end of the summary, he did not have signs of sepsis, but actually it was felt that his tachycardia, leukocytosis, lipase level, renal failure while attributed to significant dehydration brought on by his severe hyperglycemia.  This was able to be corrected for several days and the patient did well.  By the night of hospital  day #2, the patient started having some mental status changes.  He looked to be more belligerent, refusing care and looked to be quite delirious.  This was a change from his initial presentation.  Psychiatry was consulted and reviewed the patient's medications.  It was found that he takes 1 mg of Xanax several times a day.  He also did admit on initial intake to drinking some about 6 beers a day __________  it is not more than. Urine drug screen was not done on admission.  The patient required medications to help keep him calm and was given doses of Ativan, Librium, and Haldol for several days, is improved.  However, he became quite confused by the morning of September 24, 2011, required Precedex. This  greatly sedated the patient and by the early morning of September 25, 2011, he was able to wean off the Precedex drip.  At this point, he was looked to be alert and oriented, much more pleasant.  His sugars at this point were stable, running anywhere from the mid 100s to low 200s. He had also been weaned off insulin and IV fluids just on hospital day #2 and tolerating Lantus which has been slowly titrated up.  By the day of discharge, the patient's electrolytes are normal.  His blood sugars have been ranging in the mid to high 100s.  The plan will be to discharge the patient home on Lantus pen, __________  reeducation for, he is also __________ for outpatient diabetes education, which he is interested in.  In regards to his withdrawals and psychosis, it is likely felt that he had become dependent upon the Xanax and become severely delirious without it.  He did require multiple doses of Librium __________.  Psychiatry had been consulted, has been guiding his treatments and was able to sign off once the patient was felt to be fully alert and oriented.  It was noted on admission that the patient's BNP was elevated by several thousand points; however, an echocardiogram was completely normal and this was felt that BNP was more incorrect secondary to his renal dysfunction.  Therefore, he does not __________ to be in any kind of congestive heart failure.     Hollice Espy, M.D.     SKK/MEDQ  D:  09/26/2011  T:  09/26/2011  Job:  [id number]  cc:   Jeannett Senior A. Clent Ridges, MD [address] [place] [address]  Electronically Signed by Virginia Rochester M.D. on 10/09/2011 10:36:11 AM

## 2011-10-14 ENCOUNTER — Ambulatory Visit (INDEPENDENT_AMBULATORY_CARE_PROVIDER_SITE_OTHER): Payer: Medicare Other | Admitting: Family Medicine

## 2011-10-14 ENCOUNTER — Encounter: Payer: Self-pay | Admitting: Family Medicine

## 2011-10-14 VITALS — BP 114/72 | HR 86 | Temp 98.9°F | Wt 179.0 lb

## 2011-10-14 DIAGNOSIS — E119 Type 2 diabetes mellitus without complications: Secondary | ICD-10-CM

## 2011-10-14 MED ORDER — INSULIN ASPART 100 UNIT/ML ~~LOC~~ SOLN: SUBCUTANEOUS | Status: DC

## 2011-10-14 NOTE — Progress Notes (Signed)
  Subjective:    Patient ID: Wayne Williams, male    DOB: 01/03/1951, 60 y.o.   MRN: [id number]  HPI Here to follow up on recently diagnosed diabetes. At our last visit we changed his insulin regimen, and he feels much better. He has more energy and sleeps better. He still usually does not eat any food during the middle of the day. His glucoses now range from 118 to 240.    Review of Systems  Constitutional: Negative.   Respiratory: Negative.   Cardiovascular: Negative.        Objective:   Physical Exam  Constitutional: He appears well-developed and well-nourished.  Cardiovascular: Normal rate, regular rhythm, normal heart sounds and intact distal pulses.   Pulmonary/Chest: Effort normal and breath sounds normal.          Assessment & Plan:  He is doing much better but we will adjust his Novolog to 10 units with breakfast and supper, and none at lunch. Recheck in one month

## 2011-10-16 ENCOUNTER — Other Ambulatory Visit: Payer: Self-pay | Admitting: Family Medicine

## 2011-10-17 NOTE — Telephone Encounter (Signed)
Script sent e-scribe 

## 2011-10-20 ENCOUNTER — Other Ambulatory Visit: Payer: Self-pay | Admitting: Family Medicine

## 2011-10-21 ENCOUNTER — Other Ambulatory Visit: Payer: Self-pay | Admitting: Family Medicine

## 2011-10-22 ENCOUNTER — Other Ambulatory Visit: Payer: Self-pay | Admitting: Family Medicine

## 2011-10-23 NOTE — Telephone Encounter (Signed)
This was done.

## 2011-10-28 ENCOUNTER — Telehealth: Payer: Self-pay | Admitting: Family Medicine

## 2011-10-28 MED ORDER — ALPRAZOLAM 0.5 MG PO TABS: 0.5000 mg | ORAL_TABLET | Freq: Three times a day (TID) | ORAL | Status: DC

## 2011-10-28 NOTE — Telephone Encounter (Signed)
I called in script because pt still had refills left and pt aware.

## 2011-10-28 NOTE — Telephone Encounter (Signed)
Pt requesting refill on ALPRAZolam (XANAX) 0.5 MG tablet  Pt contacted pharmacy over a week ago and have not received rx yet.  [facility]

## 2011-11-11 ENCOUNTER — Encounter: Payer: Self-pay | Admitting: Family Medicine

## 2011-11-11 ENCOUNTER — Ambulatory Visit (INDEPENDENT_AMBULATORY_CARE_PROVIDER_SITE_OTHER): Payer: Medicare Other | Admitting: Family Medicine

## 2011-11-11 VITALS — BP 124/78 | HR 97 | Temp 98.9°F | Wt 181.0 lb

## 2011-11-11 DIAGNOSIS — E119 Type 2 diabetes mellitus without complications: Secondary | ICD-10-CM

## 2011-11-11 NOTE — Progress Notes (Signed)
  Subjective:    Patient ID: Wayne Williams, male    DOB: [birth year], 60 y.o.   MRN: [id number]  HPI Here to follow up on diabetes. He is doing well and feels well. His glucoses mostly range from 120s to 160s. He takes Novolog with breakfast and supper, and he takes Lantus in the mornings.    Review of Systems  Constitutional: Negative.   Respiratory: Negative.   Cardiovascular: Negative.        Objective:   Physical Exam  Constitutional: He appears well-developed and well-nourished.  Neck: No thyromegaly present.  Cardiovascular: Normal rate, regular rhythm, normal heart sounds and intact distal pulses.   Pulmonary/Chest: Effort normal and breath sounds normal.  Lymphadenopathy:    He has no cervical adenopathy.          Assessment & Plan:  Keep the Novolog dosing the same, but switch the Lantus to bedtime. Recheck in 2 months

## 2011-11-12 ENCOUNTER — Telehealth: Payer: Self-pay | Admitting: Family Medicine

## 2011-11-12 NOTE — Telephone Encounter (Signed)
Pt left a voice message stating that he needed some information about insulin.

## 2011-11-17 ENCOUNTER — Telehealth: Payer: Self-pay | Admitting: Family Medicine

## 2011-11-17 NOTE — Telephone Encounter (Signed)
Pt has question regarding insulin. Pt received an rx from [facility] and was informed by the pharmacy that if he received the rx from his PCP it would be less expensive. Pt requesting he be contacted

## 2011-11-17 NOTE — Telephone Encounter (Signed)
Pt is still waiting on callback from nurse

## 2011-11-19 ENCOUNTER — Telehealth: Payer: Self-pay | Admitting: Family Medicine

## 2011-11-19 MED ORDER — INSULIN GLARGINE 100 UNIT/ML ~~LOC~~ SOLN: 35.0000 [IU] | Freq: Every day | SUBCUTANEOUS | Status: DC

## 2011-11-19 MED ORDER — INSULIN ASPART 100 UNIT/ML ~~LOC~~ SOLN: SUBCUTANEOUS | Status: DC

## 2011-11-19 NOTE — Telephone Encounter (Signed)
Pt called back and stated that if he get rxs for vials and insulin, that it will be cheaper, according to his insurance company. Please call [facility]. He wants to apologize for any inconveniences. Thanks.

## 2011-11-19 NOTE — Telephone Encounter (Signed)
Script sent e-scribe, left voice message for pt, not sure if he needs syringes or not? If so then I can send a script for that.

## 2011-11-19 NOTE — Telephone Encounter (Signed)
Pt called and requested the flex pens for all insulin and send to [facility] pharmacy. I will have to cancel the previous order that was sent to [facility].

## 2011-11-25 MED ORDER — INSULIN SYRINGES (DISPOSABLE) U-100 0.3 ML MISC: Status: DC

## 2011-11-25 MED ORDER — INSULIN ASPART 100 UNIT/ML ~~LOC~~ SOLN: SUBCUTANEOUS | Status: DC

## 2011-11-25 MED ORDER — INSULIN GLARGINE 100 UNIT/ML ~~LOC~~ SOLN: SUBCUTANEOUS | Status: DC

## 2011-11-25 NOTE — Telephone Encounter (Signed)
Rx for Novolog, Lantus and insulin needles sent to [facility].

## 2011-11-28 ENCOUNTER — Telehealth: Payer: Self-pay | Admitting: Family Medicine

## 2011-11-28 NOTE — Telephone Encounter (Signed)
Pt called and said that there is a problem with his diabetic meds/supplies. Pt was told that syringes and vials are cheaper, but pt found out that is not so. Pt is req to remain on the Flexpen. Pt does not wants this called in yet because he can not afford it at this time. Pt has schd an ov on Tues 12/02/11 to discuss this with Dr Clent Ridges.

## 2011-12-02 ENCOUNTER — Encounter: Payer: Self-pay | Admitting: Family Medicine

## 2011-12-02 ENCOUNTER — Ambulatory Visit (INDEPENDENT_AMBULATORY_CARE_PROVIDER_SITE_OTHER): Payer: Medicare Other | Admitting: Family Medicine

## 2011-12-02 VITALS — BP 136/88 | HR 93 | Temp 98.4°F | Wt 181.0 lb

## 2011-12-02 DIAGNOSIS — E119 Type 2 diabetes mellitus without complications: Secondary | ICD-10-CM

## 2011-12-02 MED ORDER — METFORMIN HCL 1000 MG PO TABS: 1000.0000 mg | ORAL_TABLET | Freq: Two times a day (BID) | ORAL | Status: DC

## 2011-12-02 NOTE — Progress Notes (Signed)
  Subjective:    Patient ID: Wayne Williams, male    DOB: Mar 09, 1951, 60 y.o.   MRN: [id number]  HPI Here with concerns about his insulin regimen. His morning fasting glucoses range from 120 to 140. He has not checked them throughout the day at all. However he has awakened at night several times feeling sweaty and shaky, and his glucose has dropped to 35 or 40. He eats something and it resolves. He is also concerned about cost issues, since both insulins he uses are very expensive. He has never been on oral diabetic meds, since he came home form the hospital on insulin only.    Review of Systems  Constitutional: Negative.   Respiratory: Negative.   Cardiovascular: Negative.        Objective:   Physical Exam  Constitutional: He appears well-developed and well-nourished.  Cardiovascular: Normal rate, regular rhythm, normal heart sounds and intact distal pulses.   Pulmonary/Chest: Effort normal and breath sounds normal.          Assessment & Plan:  We will stay on Lantus but stop the Novolog. Start on Metformin bid . Recheck in one month

## 2011-12-24 ENCOUNTER — Other Ambulatory Visit: Payer: Self-pay | Admitting: Family Medicine

## 2012-01-20 ENCOUNTER — Ambulatory Visit (INDEPENDENT_AMBULATORY_CARE_PROVIDER_SITE_OTHER): Payer: Medicare Other | Admitting: Family Medicine

## 2012-01-20 ENCOUNTER — Encounter: Payer: Self-pay | Admitting: Family Medicine

## 2012-01-20 VITALS — BP 126/80 | HR 89 | Temp 98.5°F | Wt 178.0 lb

## 2012-01-20 DIAGNOSIS — I10 Essential (primary) hypertension: Secondary | ICD-10-CM

## 2012-01-20 DIAGNOSIS — E119 Type 2 diabetes mellitus without complications: Secondary | ICD-10-CM

## 2012-01-20 LAB — HEMOGLOBIN A1C: Hgb A1c MFr Bld: 6 % (ref 4.6–6.5)

## 2012-01-20 NOTE — Progress Notes (Signed)
  Subjective:    Patient ID: Wayne Williams, male    DOB: 1951/04/05, 61 y.o.   MRN: [id number]  HPI Here to follow up on diabetes. At our last visit we stopped Novolog and substituted metformin because of cost reasons and because he was having a lot of hypoglycemic episodes. He has continued with Lantus. However he is still having drops to the 30s or 40s overnight. His higher end readings are 170 or so. He feels fine in general.    Review of Systems  Constitutional: Negative.   Respiratory: Negative.   Cardiovascular: Negative.        Objective:   Physical Exam  Constitutional: He appears well-developed and well-nourished.  Cardiovascular: Normal rate, regular rhythm, normal heart sounds and intact distal pulses.   Pulmonary/Chest: Effort normal and breath sounds normal.          Assessment & Plan:  We will stop the Lantus also and just stick with Metformin. Check an A1c today.

## 2012-01-21 LAB — MICROALBUMIN / CREATININE URINE RATIO
Creatinine,U: 63.8 mg/dL
Microalb Creat Ratio: 21.8 mg/g (ref 0.0–30.0)
Microalb, Ur: 13.9 mg/dL — ABNORMAL HIGH (ref 0.0–1.9)

## 2012-01-27 MED ORDER — LISINOPRIL 5 MG PO TABS: 5.0000 mg | ORAL_TABLET | Freq: Every day | ORAL | Status: DC

## 2012-01-27 NOTE — Progress Notes (Signed)
Quick Note:  Spoke with pt and sent script e-scribe. ______ 

## 2012-01-27 NOTE — Progress Notes (Signed)
Addended by: Aniceto Boss A on: 01/27/2012 04:49 PM   Modules accepted: Orders

## 2012-02-27 ENCOUNTER — Telehealth: Payer: Self-pay | Admitting: Family Medicine

## 2012-02-27 MED ORDER — ALPRAZOLAM 0.5 MG PO TABS: 0.5000 mg | ORAL_TABLET | Freq: Four times a day (QID) | ORAL | Status: DC

## 2012-02-27 NOTE — Telephone Encounter (Signed)
Pt would like sylvia to return his call concerning alprazolam

## 2012-02-27 NOTE — Telephone Encounter (Signed)
Script was called in and Dr. Clent Ridges did approve.

## 2012-02-27 NOTE — Telephone Encounter (Signed)
Opened in error

## 2012-04-19 ENCOUNTER — Ambulatory Visit (INDEPENDENT_AMBULATORY_CARE_PROVIDER_SITE_OTHER): Payer: Medicare Other | Admitting: Family Medicine

## 2012-04-19 ENCOUNTER — Encounter: Payer: Self-pay | Admitting: Family Medicine

## 2012-04-19 VITALS — BP 128/74 | HR 89 | Temp 99.2°F | Wt 174.0 lb

## 2012-04-19 DIAGNOSIS — E119 Type 2 diabetes mellitus without complications: Secondary | ICD-10-CM

## 2012-04-19 DIAGNOSIS — I10 Essential (primary) hypertension: Secondary | ICD-10-CM

## 2012-04-19 LAB — HEMOGLOBIN A1C: Hgb A1c MFr Bld: 6.4 % (ref 4.6–6.5)

## 2012-04-19 NOTE — Progress Notes (Signed)
  Subjective:    Patient ID: Wayne Williams, male    DOB: [birth year], 61 y.o.   MRN: [id number]  HPI Here to follow up diabetes, after we switched from insulin therapy to Metformin. His A1c two months ago was 6.0. He still watches his diet. He has not checked his glucoses for some time. He feels well.    Review of Systems  Constitutional: Negative.   Respiratory: Negative.   Cardiovascular: Negative.        Objective:   Physical Exam  Constitutional: He appears well-developed and well-nourished.  Cardiovascular: Normal rate, regular rhythm, normal heart sounds and intact distal pulses.   Pulmonary/Chest: Effort normal and breath sounds normal.          Assessment & Plan:  Check another A1c today. He will use OTC nicotine patches to quit smoking.

## 2012-04-20 ENCOUNTER — Other Ambulatory Visit: Payer: Self-pay | Admitting: Family Medicine

## 2012-04-20 NOTE — Progress Notes (Signed)
Quick Note:  Pt informed, future lab ordered ______ 

## 2012-04-20 NOTE — Progress Notes (Signed)
Quick Note:  Pt informed ______ 

## 2012-07-20 ENCOUNTER — Telehealth: Payer: Self-pay | Admitting: Family Medicine

## 2012-07-20 NOTE — Telephone Encounter (Signed)
Caller: Wayne Williams/Patient; Phone Number: [phone]; Message from caller: Pt calling today 07/20/12 regarding has an abcessed tooth and dentist put him on Clindamycin.  Dentist told him to stop taking his Plavix and for him to check with Dr.  Clent Ridges to make sure that this would be ok for him to do.  Pt calling per dentist request.  Is it ok for him to stop taking his Plavix, said dentist is going to pull his tooth on Monday 07/26/12.  (Dentist is going to pull multiple teeth that day.  ) PLEASE CALL PT BACK AT [phone] LET HIM KNOW IT IS OK TO STOP TAKING HIS PLAVIX.

## 2012-07-21 ENCOUNTER — Telehealth: Payer: Self-pay | Admitting: Family Medicine

## 2012-07-21 NOTE — Telephone Encounter (Signed)
Caller: Bernhardt/Patient; PCP: Nelwyn Salisbury.; CB#: [phone]; Pt calling today 07/21/12 regarding called yesterday regarding needs to know if ok to stop his plavix per dentist request b/c he is having dental surgery.  Office was supposed to call him back and he has not heard a response back. Triager pulled chart in Epic and note per Dr. Clent Ridges this AM, "Yes it is okay to stop the Plavix until the day after his teeth are pulled."  Triager advised pt of MD note and he verbalized understanding.

## 2012-07-21 NOTE — Telephone Encounter (Signed)
Yes it is okay to stop the Plavix until the day after his teeth are pulled

## 2012-07-21 NOTE — Telephone Encounter (Signed)
I spoke with pt  

## 2012-09-12 ENCOUNTER — Other Ambulatory Visit: Payer: Self-pay | Admitting: Family Medicine

## 2012-09-14 ENCOUNTER — Telehealth: Payer: Self-pay | Admitting: Family Medicine

## 2012-09-14 NOTE — Telephone Encounter (Signed)
Refill request for Xanax 0.5 mg take 1 po qid and last here on 04/19/12.

## 2012-09-14 NOTE — Telephone Encounter (Signed)
Rx last filled 03/17/12 #120 x 5rf.  Pt last sen 04/19/12. Pls advise.

## 2012-09-14 NOTE — Telephone Encounter (Signed)
Call in #[phone] with 5 rf 

## 2012-09-15 ENCOUNTER — Ambulatory Visit (INDEPENDENT_AMBULATORY_CARE_PROVIDER_SITE_OTHER): Payer: Medicare Other | Admitting: Family Medicine

## 2012-09-15 ENCOUNTER — Encounter: Payer: Self-pay | Admitting: Family Medicine

## 2012-09-15 ENCOUNTER — Ambulatory Visit (INDEPENDENT_AMBULATORY_CARE_PROVIDER_SITE_OTHER)
Admission: RE | Admit: 2012-09-15 | Discharge: 2012-09-15 | Disposition: A | Discharge status: Home / Self Care | Payer: Medicare Other | Source: Ambulatory Visit | Attending: Family Medicine | Admitting: Family Medicine

## 2012-09-15 VITALS — BP 124/72 | HR 105 | Temp 97.8°F | Wt 164.0 lb

## 2012-09-15 DIAGNOSIS — J189 Pneumonia, unspecified organism: Secondary | ICD-10-CM

## 2012-09-15 MED ORDER — CEFTRIAXONE SODIUM 1 G IJ SOLR
1.0000 g | Freq: Once | INTRAMUSCULAR | Status: AC
  Administered 2012-09-15: 1 g via INTRAMUSCULAR

## 2012-09-15 MED ORDER — ALPRAZOLAM 0.5 MG PO TABS: 0.5000 mg | ORAL_TABLET | Freq: Four times a day (QID) | ORAL | Status: DC

## 2012-09-15 MED ORDER — AMOXICILLIN-POT CLAVULANATE 875-125 MG PO TABS: 1.0000 | ORAL_TABLET | Freq: Two times a day (BID) | ORAL | Status: DC

## 2012-09-15 NOTE — Addendum Note (Signed)
Addended by: Aniceto Boss A on: 09/15/2012 10:30 AM   Modules accepted: Orders

## 2012-09-15 NOTE — Telephone Encounter (Signed)
Call in #[phone] with 5 rf 

## 2012-09-15 NOTE — Progress Notes (Signed)
  Subjective:    Patient ID: Wayne Williams, male    DOB: 21-Apr-1951, 61 y.o.   MRN: [id number]  HPI Here for one week of coughing and fevers. The cough has gotten worse, it is a deep chest cough that is not productive. No chest pain but he is SOB. No NVD. He is quite weak with little appetite.    Review of Systems  Constitutional: Positive for fever, chills and fatigue.  HENT: Negative.   Eyes: Negative.   Respiratory: Positive for cough, chest tightness and shortness of breath. Negative for wheezing.   Cardiovascular: Negative.        Objective:   Physical Exam  Constitutional:       Alert but weak   HENT:  Right Ear: External ear normal.  Left Ear: External ear normal.  Nose: Nose normal.  Mouth/Throat: Oropharynx is clear and moist.  Eyes: Pupils are equal, round, and reactive to light.  Neck: No thyromegaly present.  Pulmonary/Chest: Effort normal. He has no wheezes.       Scattered rhonchi and rales at the left posterior base   Lymphadenopathy:    He has no cervical adenopathy.          Assessment & Plan:  LLL pneumonia. Given a shot of Rocephin to be followed by a course of Augmentin. Get a CXR. Drink plenty of fluids. Recheck early next week.

## 2012-09-15 NOTE — Telephone Encounter (Signed)
I called in script 

## 2012-09-17 NOTE — Progress Notes (Signed)
Quick Note:  I spoke with pt ______ 

## 2012-09-28 ENCOUNTER — Telehealth: Payer: Self-pay | Admitting: Family Medicine

## 2012-09-28 NOTE — Telephone Encounter (Addendum)
Pt called and said that he was in for pneumonia on 09/15/12, and he doesn't feel any better. Pt has no energy at all. Pt has finished all abx. Pt said that all he can do is wake up in a.m and sit on couch. Req Nettie Elm to call back.

## 2012-09-28 NOTE — Telephone Encounter (Signed)
Per Dr. Clent Ridges, pt needs to come in for a visit and I spoke with pt. He is going to schedule a office visit.

## 2012-09-29 ENCOUNTER — Encounter: Payer: Self-pay | Admitting: Family Medicine

## 2012-09-29 ENCOUNTER — Ambulatory Visit (INDEPENDENT_AMBULATORY_CARE_PROVIDER_SITE_OTHER): Payer: Medicare Other | Admitting: Family Medicine

## 2012-09-29 VITALS — BP 112/64 | HR 91 | Temp 98.1°F | Wt 165.0 lb

## 2012-09-29 DIAGNOSIS — N419 Inflammatory disease of prostate, unspecified: Secondary | ICD-10-CM

## 2012-09-29 DIAGNOSIS — J189 Pneumonia, unspecified organism: Secondary | ICD-10-CM

## 2012-09-29 LAB — POCT URINALYSIS DIPSTICK
Bilirubin, UA: NEGATIVE
Glucose, UA: NEGATIVE
Ketones, UA: NEGATIVE
Leukocytes, UA: NEGATIVE
Nitrite, UA: NEGATIVE
Spec Grav, UA: 1.025
Urobilinogen, UA: 0.2
pH, UA: 6

## 2012-09-29 MED ORDER — CIPROFLOXACIN HCL 500 MG PO TABS: 500.0000 mg | ORAL_TABLET | Freq: Two times a day (BID) | ORAL | Status: DC

## 2012-09-29 NOTE — Progress Notes (Signed)
  Subjective:    Patient ID: Wayne Williams, male    DOB: [birth year], 61 y.o.   MRN: [id number]  HPI Here to follow up on a pneumonia which was diagnosed by clinical exam here on 09-15-12. CR later that day was clear. He had a deep productive cough and had rales on exam. He was given a shot of Rocephin that day and was started on a course of Augmentin. Since then he feels much better from a chest standpoint. The cough is much better and he has less SOB. However in the past few days he has had increased frequency and urgency of urination. No burning, but he may feel an intense need to urinate and then only dribble when he goes. No fever.    Review of Systems  Constitutional: Negative.   Respiratory: Negative.   Cardiovascular: Negative.   Gastrointestinal: Negative.   Genitourinary: Positive for urgency, frequency and difficulty urinating. Negative for dysuria, hematuria and flank pain.       Objective:   Physical Exam  Constitutional: He appears well-developed and well-nourished.  Cardiovascular: Normal rate, regular rhythm, normal heart sounds and intact distal pulses.   Pulmonary/Chest: Effort normal and breath sounds normal. No respiratory distress. He has no wheezes. He has no rales.  Abdominal: Soft. Bowel sounds are normal. He exhibits no distension and no mass. There is no tenderness. There is no rebound and no guarding.  Genitourinary:       Prostate is swollen and tender           Assessment & Plan:  The pneumonia has resolved but now he seems to have a prostatitis. We will treat with Cipro and recheck prn

## 2012-09-29 NOTE — Addendum Note (Signed)
Addended by: Aniceto Boss A on: 09/29/2012 02:00 PM   Modules accepted: Orders

## 2012-09-29 NOTE — Addendum Note (Signed)
Addended by: Rita Ohara R on: 09/29/2012 03:09 PM   Modules accepted: Orders

## 2012-10-03 LAB — URINE CULTURE: Colony Count: 10000

## 2012-10-04 NOTE — Progress Notes (Signed)
Quick Note:  I left voice message with results. ______ 

## 2012-10-11 ENCOUNTER — Telehealth: Payer: Self-pay | Admitting: Family Medicine

## 2012-10-11 NOTE — Telephone Encounter (Signed)
Caller: Tia/Patient; Patient Name: Wayne Williams; PCP: Gershon Crane [facility]); Best Callback Phone Number: [phone]; Follow up from Prostate Issues, Pt finished Cipro on 10-12.  Pt only able to pass drops of urine with frequency, no pain.  All emergent symptoms ruled out per Urinary Symptoms protocol, see in 24 hrs due to evaluated by provider and symptoms worsening after following recommended treatment. Appt scheduled on 10-15 at 2pm per Pt's request.  Pt verbalized understanding.

## 2012-10-12 ENCOUNTER — Encounter: Payer: Self-pay | Admitting: Family Medicine

## 2012-10-12 ENCOUNTER — Ambulatory Visit (INDEPENDENT_AMBULATORY_CARE_PROVIDER_SITE_OTHER): Payer: Medicare Other | Admitting: Family Medicine

## 2012-10-12 VITALS — BP 112/70 | HR 78 | Temp 98.5°F | Wt 166.0 lb

## 2012-10-12 DIAGNOSIS — N419 Inflammatory disease of prostate, unspecified: Secondary | ICD-10-CM

## 2012-10-12 MED ORDER — CIPROFLOXACIN HCL 500 MG PO TABS: 500.0000 mg | ORAL_TABLET | Freq: Two times a day (BID) | ORAL | Status: DC

## 2012-10-12 MED ORDER — TAMSULOSIN HCL 0.4 MG PO CAPS: 0.4000 mg | ORAL_CAPSULE | Freq: Every day | ORAL | Status: DC

## 2012-10-12 NOTE — Progress Notes (Signed)
  Subjective:    Patient ID: Wayne Williams, male    DOB: [birth year], 61 y.o.   MRN: [id number]  HPI Here for continued prostate symptoms. He was here 2 weeks ago for urinary frequency and urgency. No fevers. On exam the prostate was swollen and tender. He took 10 days of Cipro, and now he feels better but not totally well. He still has increased frequency but no pain. His urine culture grew a coagulase negative Staphylococcus that is sensitive to Cipro.    Review of Systems  Constitutional: Negative.   Genitourinary: Positive for urgency, frequency and difficulty urinating. Negative for dysuria, hematuria and flank pain.       Objective:   Physical Exam  Constitutional: He appears well-developed and well-nourished.  Abdominal: Soft. Bowel sounds are normal. He exhibits no distension and no mass. There is no tenderness. There is no rebound and no guarding.          Assessment & Plan:  Partially treated prostatitis on top of BPH. Given a 30 day course of Cipro and we will start him on Flomax daily. Once this infection is taken care of he will need to get a PSA level, since it has been at least several years since this was checked.

## 2012-10-21 ENCOUNTER — Other Ambulatory Visit: Payer: Self-pay | Admitting: Family Medicine

## 2012-11-17 ENCOUNTER — Telehealth: Payer: Self-pay | Admitting: Family Medicine

## 2012-11-17 NOTE — Telephone Encounter (Signed)
Yes, it is okay to stop the meds for 2 days before his dental work. Yes the antibiotics could have affected the Flomax, so stay on it

## 2012-11-17 NOTE — Telephone Encounter (Signed)
Caller: Wayne Williams/Patient; Phone: [phone]; Reason for Call: Patient calling, he is on oral antibiotics for a dental issue.  His dentist Dr.  Hewitt Blade has advised him to take the medication until the sensitivity has cleared and then to stop his Plavix and ASA  for 2 days prior to coming to the office.  He wants to clear this with Dr.  Clent Ridges before stopping the medication.   Also, the Flomax is not helping anymore since being on 2 different antibiotics.  Could the antibiotics be negating the effect of the Flomax?

## 2012-11-17 NOTE — Telephone Encounter (Signed)
I spoke with pt  

## 2012-11-18 ENCOUNTER — Telehealth: Payer: Self-pay | Admitting: Family Medicine

## 2012-11-18 NOTE — Telephone Encounter (Signed)
Tell him that it is okay if he took 2 Flomax pills. We actually prescribe it this way sometimes.

## 2012-11-18 NOTE — Telephone Encounter (Signed)
I spoke with pt  

## 2012-11-18 NOTE — Telephone Encounter (Signed)
Patient Information:  Caller Name: Decorian  Phone: [phone]  Patient: Wayne Williams, Wayne Williams  Gender: Male  DOB: Oct 20, 1951  Age: 61 Years  PCP: Gershon Crane [facility])   Symptoms  Reason For Call & Symptoms: Patient states he confused his medications this morning but is unsure. He thinks he may have take two Flomax 0.4mg  instead of just one.  He also has had recent antibiotic medications due to a dental procedure  added to his regimen that confused him-  (1) Amoxicillen 500mg  take 2 caps now and 1 mid day and 1 at night (2) Ciprofloxin- unsure if he was suppose to continue .  He thinks he may have mixed up the Flomax and took two and took one of the Amoxicillen.  Reviewed Health History In EMR: Yes  Reviewed Medications In EMR: Yes  Reviewed Allergies In EMR: Yes  Date of Onset of Symptoms: 11/18/2012  Guideline(s) Used:  Poisoning  Disposition Per Guideline:   Home Care  Reason For Disposition Reached:   HARMLESS OVERDOSE (nontoxic) of OTC drug or antibiotic  Advice Given:  N/A  Office Follow Up:  Does the office need to follow up with this patient?: Yes  Instructions For The Office: Patient is currently on Amoxicillen.  He is confused should he continue the Ciprofloxin. Please advise  **Patient did also discuss some depression issues during triage. States he feels safe and does not have a plan to hurt himself but does display some depression. Offered an appt with Dr. Clent Ridges and declined, to discuss.  RN Note:  Games developer at [employer] [phone]. She states no issue with a double dose of Flomax. Advised to go from sitting to standing slowly. Mointor for any issues with blood pressure but no complications noted. Patient updated on information. Expressed understanding. We did discuss the importance of a pill organizer, list and organization of taking medication for safety.

## 2012-12-06 ENCOUNTER — Telehealth: Payer: Self-pay | Admitting: Family Medicine

## 2012-12-06 MED ORDER — METFORMIN HCL 1000 MG PO TABS: 1000.0000 mg | ORAL_TABLET | Freq: Two times a day (BID) | ORAL | Status: DC

## 2012-12-06 NOTE — Telephone Encounter (Signed)
Pt has only one pill left for tonight

## 2012-12-06 NOTE — Telephone Encounter (Signed)
I sent script e-scribe and spoke with pt. 

## 2012-12-06 NOTE — Telephone Encounter (Signed)
Patient called stating that he is out of refills of his metformin 1000 mg take 1bid with meal and need them sent to [facility] on [address]. Please assist.

## 2012-12-29 ENCOUNTER — Other Ambulatory Visit: Payer: Self-pay | Admitting: Family Medicine

## 2013-01-19 ENCOUNTER — Ambulatory Visit (INDEPENDENT_AMBULATORY_CARE_PROVIDER_SITE_OTHER): Payer: Medicare Other | Admitting: Family Medicine

## 2013-01-19 ENCOUNTER — Encounter: Payer: Self-pay | Admitting: Family Medicine

## 2013-01-19 VITALS — BP 102/60 | Temp 98.6°F | Wt 161.0 lb

## 2013-01-19 DIAGNOSIS — F329 Major depressive disorder, single episode, unspecified: Secondary | ICD-10-CM

## 2013-01-19 DIAGNOSIS — I2510 Atherosclerotic heart disease of native coronary artery without angina pectoris: Secondary | ICD-10-CM

## 2013-01-19 DIAGNOSIS — M545 Low back pain, unspecified: Secondary | ICD-10-CM

## 2013-01-19 DIAGNOSIS — E119 Type 2 diabetes mellitus without complications: Secondary | ICD-10-CM

## 2013-01-19 DIAGNOSIS — Z8679 Personal history of other diseases of the circulatory system: Secondary | ICD-10-CM

## 2013-01-19 DIAGNOSIS — I10 Essential (primary) hypertension: Secondary | ICD-10-CM

## 2013-01-19 LAB — POCT URINALYSIS DIPSTICK
Bilirubin, UA: NEGATIVE
Glucose, UA: NEGATIVE
Ketones, UA: NEGATIVE
Leukocytes, UA: NEGATIVE
Nitrite, UA: NEGATIVE
Spec Grav, UA: 1.015
Urobilinogen, UA: 0.2
pH, UA: 6

## 2013-01-19 LAB — CBC WITH DIFFERENTIAL/PLATELET
Basophils Absolute: 0.1 10*3/uL (ref 0.0–0.1)
Basophils Relative: 1 % (ref 0.0–3.0)
Eosinophils Absolute: 0.2 10*3/uL (ref 0.0–0.7)
Eosinophils Relative: 2.9 % (ref 0.0–5.0)
HCT: 41.5 % (ref 39.0–52.0)
Hemoglobin: 14.1 g/dL (ref 13.0–17.0)
Lymphocytes Relative: 23.9 % (ref 12.0–46.0)
Lymphs Abs: 2 10*3/uL (ref 0.7–4.0)
MCHC: 33.9 g/dL (ref 30.0–36.0)
MCV: 89.7 fl (ref 78.0–100.0)
Monocytes Absolute: 0.7 10*3/uL (ref 0.1–1.0)
Monocytes Relative: 7.9 % (ref 3.0–12.0)
Neutro Abs: 5.3 10*3/uL (ref 1.4–7.7)
Neutrophils Relative %: 64.3 % (ref 43.0–77.0)
Platelets: 295 10*3/uL (ref 150.0–400.0)
RBC: 4.63 Mil/uL (ref 4.22–5.81)
RDW: 14.2 % (ref 11.5–14.6)
WBC: 8.3 10*3/uL (ref 4.5–10.5)

## 2013-01-19 LAB — HEMOGLOBIN A1C: Hgb A1c MFr Bld: 6.3 % (ref 4.6–6.5)

## 2013-01-19 LAB — HEPATIC FUNCTION PANEL
ALT: 19 U/L (ref 0–53)
AST: 23 U/L (ref 0–37)
Albumin: 4.3 g/dL (ref 3.5–5.2)
Alkaline Phosphatase: 59 U/L (ref 39–117)
Bilirubin, Direct: 0.1 mg/dL (ref 0.0–0.3)
Total Bilirubin: 0.4 mg/dL (ref 0.3–1.2)
Total Protein: 7.5 g/dL (ref 6.0–8.3)

## 2013-01-19 LAB — BASIC METABOLIC PANEL
BUN: 15 mg/dL (ref 6–23)
CO2: 30 mEq/L (ref 19–32)
Calcium: 9.3 mg/dL (ref 8.4–10.5)
Chloride: 94 mEq/L — ABNORMAL LOW (ref 96–112)
Creatinine, Ser: 1.1 mg/dL (ref 0.4–1.5)
GFR: 72.41 mL/min (ref >60.00)
Glucose, Bld: 96 mg/dL (ref 70–99)
Potassium: 4.2 mEq/L (ref 3.5–5.1)
Sodium: 132 mEq/L — ABNORMAL LOW (ref 135–145)

## 2013-01-19 LAB — TSH: TSH: 0.72 u[IU]/mL (ref 0.35–5.50)

## 2013-01-19 NOTE — Progress Notes (Signed)
  Subjective:    Patient ID: Wayne Williams, male    DOB: [birth year], 62 y.o.   MRN: [id number]  HPI Here to follow up. He is doing well except for some chronic fatigue. His appetite remains low. No coughing or SOB. His BP is a bit low.    Review of Systems  Constitutional: Positive for fatigue.  Respiratory: Negative.   Cardiovascular: Negative.        Objective:   Physical Exam  Constitutional: He appears well-developed and well-nourished.  Cardiovascular: Normal rate, regular rhythm, normal heart sounds and intact distal pulses.   Pulmonary/Chest: Effort normal and breath sounds normal.          Assessment & Plan:  His BP is little low and this may be a part of his fatigue. Stop the HCTZ. Get labs

## 2013-01-21 NOTE — Progress Notes (Signed)
Quick Note:  I spoke with pt and he said that he had talked to you about another test, to check prostate? ______

## 2013-03-16 ENCOUNTER — Other Ambulatory Visit: Payer: Self-pay | Admitting: Family Medicine

## 2013-03-17 NOTE — Telephone Encounter (Signed)
Call in #[phone] with 5 rf 

## 2013-04-29 ENCOUNTER — Other Ambulatory Visit: Payer: Self-pay | Admitting: Family Medicine

## 2013-07-05 ENCOUNTER — Ambulatory Visit: Payer: Medicare Other | Admitting: Family Medicine

## 2013-07-07 ENCOUNTER — Telehealth: Payer: Self-pay | Admitting: Family Medicine

## 2013-07-07 NOTE — Telephone Encounter (Signed)
Patient Information:  Caller Name: Kishan  Phone: [phone]  Patient: Wayne Williams, Wayne Williams  Gender: Male  DOB: 1951-04-05  Age: 62 Years  PCP: Gershon Crane [facility])  Office Follow Up:  Does the office need to follow up with this patient?: No  Instructions For The Office: N/A  RN Note:  Declined triage because he is asymptomatic. Suspects he only took one Metformin, not two. Reviewed s/s of hyperglycemia and actions to take if develops low blood sugar 70 mg/dl or lower. Agreed to recheck random blood sugars over the next 6 hours if symptomatic or prn. Will keep glucometer and source of quick-fix food or glucose tablets with him since he is planning to go out for a while. Agreed to call back if symptomatic.  Symptoms  Reason For Call & Symptoms: Became distracted while taking medications this morning so could not recall if took his Metformin 1000 mg.  Takes 1000 mg BID.  After thinking about it, he decided he had not taken the morning Metformin dose so he took it at 1000.  FBS 90 at 1000.  After drinking coffee with sugar,  random blood sugar was 109 at almost 1100.  Asking what symptoms to expect if took a double dose.  Reviewed Health History In EMR: Yes  Reviewed Medications In EMR: Yes  Reviewed Allergies In EMR: Yes  Reviewed Surgeries / Procedures: Yes  Date of Onset of Symptoms: 07/07/2013  Guideline(s) Used:  No Protocol Available - Information Only  Disposition Per Guideline:   Home Care  Reason For Disposition Reached:   Information only question and nurse able to answer  Advice Given:  N/A  Patient Will Follow Care Advice:  YES

## 2013-09-02 ENCOUNTER — Telehealth: Payer: Self-pay | Admitting: Family Medicine

## 2013-09-02 MED ORDER — CLOPIDOGREL BISULFATE 75 MG PO TABS: 75.0000 mg | ORAL_TABLET | Freq: Every day | ORAL | Status: DC

## 2013-09-02 NOTE — Telephone Encounter (Signed)
I sent script e-scribe and spoke with pt. 

## 2013-09-02 NOTE — Telephone Encounter (Signed)
Pt need refill on clopidogrel 75 mg #30 call into [facility] [address]. Pt has one pill left

## 2013-09-12 ENCOUNTER — Other Ambulatory Visit: Payer: Self-pay | Admitting: Family Medicine

## 2013-09-12 NOTE — Telephone Encounter (Signed)
Call in #[phone] with 5 rf 

## 2013-09-26 ENCOUNTER — Other Ambulatory Visit: Payer: Self-pay | Admitting: Family Medicine

## 2013-09-27 NOTE — Telephone Encounter (Signed)
Pt is out of meds

## 2013-10-05 ENCOUNTER — Ambulatory Visit (INDEPENDENT_AMBULATORY_CARE_PROVIDER_SITE_OTHER): Payer: Medicare Other | Admitting: Family Medicine

## 2013-10-05 ENCOUNTER — Encounter: Payer: Self-pay | Admitting: Family Medicine

## 2013-10-05 VITALS — BP 114/66 | HR 85 | Temp 98.2°F | Wt 170.0 lb

## 2013-10-05 DIAGNOSIS — I6350 Cerebral infarction due to unspecified occlusion or stenosis of unspecified cerebral artery: Secondary | ICD-10-CM

## 2013-10-05 DIAGNOSIS — I252 Old myocardial infarction: Secondary | ICD-10-CM

## 2013-10-05 DIAGNOSIS — F329 Major depressive disorder, single episode, unspecified: Secondary | ICD-10-CM

## 2013-10-05 DIAGNOSIS — L989 Disorder of the skin and subcutaneous tissue, unspecified: Secondary | ICD-10-CM

## 2013-10-05 DIAGNOSIS — I10 Essential (primary) hypertension: Secondary | ICD-10-CM

## 2013-10-05 MED ORDER — METOPROLOL TARTRATE 50 MG PO TABS: 50.0000 mg | ORAL_TABLET | Freq: Two times a day (BID) | ORAL | Status: DC

## 2013-10-05 MED ORDER — SIMVASTATIN 40 MG PO TABS: ORAL_TABLET | ORAL | Status: DC

## 2013-10-05 MED ORDER — CLOPIDOGREL BISULFATE 75 MG PO TABS: 75.0000 mg | ORAL_TABLET | Freq: Every day | ORAL | Status: DC

## 2013-10-05 NOTE — Addendum Note (Signed)
Addended by: Gershon Crane A on: 10/05/2013 01:55 PM   Modules accepted: Orders

## 2013-10-05 NOTE — Progress Notes (Signed)
  Subjective:    Patient ID: Wayne Williams, male    DOB: Jun 05, 1951, 62 y.o.   MRN: [id number]  HPI Here for refills. He is doing well in general. His appetite is stable and he has put on a little weight from last time. No complaints.    Review of Systems  Constitutional: Negative.   Respiratory: Negative.   Cardiovascular: Negative.   Neurological: Negative.        Objective:   Physical Exam  Constitutional: He is oriented to person, place, and time. He appears well-developed and well-nourished.  Neck: No thyromegaly present.  Cardiovascular: Normal rate, regular rhythm, normal heart sounds and intact distal pulses.   Pulmonary/Chest: Effort normal and breath sounds normal.  Lymphadenopathy:    He has no cervical adenopathy.  Neurological: He is alert and oriented to person, place, and time.          Assessment & Plan:  He is doing well. Plan on a CPX next January

## 2013-10-17 ENCOUNTER — Other Ambulatory Visit: Payer: Self-pay | Admitting: Family Medicine

## 2013-12-02 ENCOUNTER — Other Ambulatory Visit: Payer: Self-pay | Admitting: Family Medicine

## 2013-12-05 ENCOUNTER — Telehealth: Payer: Self-pay

## 2013-12-05 NOTE — Telephone Encounter (Signed)
Spoke with pt and let know to contact the office based on [employer] project to schedule an LDL to be done. Pt verbalized understanding.

## 2013-12-20 ENCOUNTER — Encounter: Payer: Self-pay | Admitting: *Deleted

## 2013-12-21 ENCOUNTER — Ambulatory Visit (INDEPENDENT_AMBULATORY_CARE_PROVIDER_SITE_OTHER): Payer: Medicare Other | Admitting: Family Medicine

## 2013-12-21 ENCOUNTER — Encounter: Payer: Self-pay | Admitting: Family Medicine

## 2013-12-21 VITALS — BP 110/60 | HR 89 | Temp 98.4°F | Wt 168.0 lb

## 2013-12-21 DIAGNOSIS — R0602 Shortness of breath: Secondary | ICD-10-CM

## 2013-12-21 DIAGNOSIS — J209 Acute bronchitis, unspecified: Secondary | ICD-10-CM

## 2013-12-21 MED ORDER — BUDESONIDE-FORMOTEROL FUMARATE 160-4.5 MCG/ACT IN AERO: 2.0000 | INHALATION_SPRAY | Freq: Two times a day (BID) | RESPIRATORY_TRACT | Status: DC

## 2013-12-21 MED ORDER — CEFTRIAXONE SODIUM 1 G IJ SOLR
1.0000 g | Freq: Once | INTRAMUSCULAR | Status: AC
  Administered 2013-12-21: 1 g via INTRAMUSCULAR

## 2013-12-21 MED ORDER — AMOXICILLIN-POT CLAVULANATE 875-125 MG PO TABS: 1.0000 | ORAL_TABLET | Freq: Two times a day (BID) | ORAL | Status: DC

## 2013-12-21 MED ORDER — METHYLPREDNISOLONE ACETATE 80 MG/ML IJ SUSP
120.0000 mg | Freq: Once | INTRAMUSCULAR | Status: AC
  Administered 2013-12-21: 120 mg via INTRAMUSCULAR

## 2013-12-21 MED ORDER — IPRATROPIUM-ALBUTEROL 0.5-2.5 (3) MG/3ML IN SOLN
3.0000 mL | Freq: Once | RESPIRATORY_TRACT | Status: AC
  Administered 2013-12-21: 3 mL via RESPIRATORY_TRACT

## 2013-12-21 NOTE — Progress Notes (Signed)
   Subjective:    Patient ID: Wayne Williams, male    DOB: 08/26/1951, 62 y.o.   MRN: [id number]  HPI Here for one week of chest tightness, SOB, chills, aches and coughing up green sputum.    Review of Systems  Constitutional: Positive for fever, chills and diaphoresis.  HENT: Negative.   Eyes: Negative.   Respiratory: Positive for cough, chest tightness, shortness of breath and wheezing.        Objective:   Physical Exam  Constitutional: He appears well-developed and well-nourished.  HENT:  Right Ear: External ear normal.  Left Ear: External ear normal.  Nose: Nose normal.  Mouth/Throat: Oropharynx is clear and moist.  Eyes: Conjunctivae are normal.  Pulmonary/Chest: Effort normal. He has no rales.  Scattered wheezes and rhonchi   Lymphadenopathy:    He has no cervical adenopathy.          Assessment & Plan:  This is bronchitis, probably secondary to influenza. Given a nebulizer treatment and some samples of Symbicort to use bid. Given shots of steroids and Rocephin, to be followed by Augmentin.

## 2013-12-21 NOTE — Progress Notes (Signed)
Pre visit review using our clinic review tool, if applicable. No additional management support is needed unless otherwise documented below in the visit note. 

## 2014-01-04 ENCOUNTER — Telehealth: Payer: Self-pay | Admitting: Family Medicine

## 2014-01-04 NOTE — Telephone Encounter (Signed)
FYI

## 2014-01-04 NOTE — Telephone Encounter (Signed)
Patient Information:  Caller Name: Fender  Phone: [phone]  Patient: Montrae, Braithwaite  Gender: Male  DOB: 13-Oct-1951  Age: 63 Years  PCP: Alysia Penna [facility])  Office Follow Up:  Does the office need to follow up with this patient?: Yes  Instructions For The Office: Please follow up with patient regarding his diarrhea and gas.  He was on antibiotics from 12/21/13 x 10 days.  He states he hopes he had a 24 or 48 hour bug.  He is hungry and states plan to eat soon.   Symptoms  Reason For Call & Symptoms: Patient reports he was seen 12/21/13  for seasonal flu.  On 01/03/14 he felt rumbling in his intestines - very noisy bowel sounds.  He has not been hungry and not eaten anything on 01/03/14 or 01/04/14 with the exception of yogurt on 01/03/14 which seemed to help "all night long".  He had diarrhea frequently (unable to determine number of stools)   on 01/03/14 and gas on 01/04/14.  Caller reports he has history of diarrhea with Amoxicillin (Augmentin completed on 1/2/ or 12/31/13).  Emergent symptoms ruled out.  Callback by PCP Today per Diarrhea guideline due to Recent antibiotic therapy.  Caller agreed.  Reviewed Health History In EMR: Yes  Reviewed Medications In EMR: Yes  Reviewed Allergies In EMR: Yes  Reviewed Surgeries / Procedures: Yes  Date of Onset of Symptoms: 01/03/2014  Treatments Tried: yogurt  Treatments Tried Worked: Yes  Guideline(s) Used:  Diarrhea  Disposition Per Guideline:   Callback by PCP Today  Reason For Disposition Reached:   Recent antibiotic therapy (i.e., within last 2 months)  Advice Given:  Fluids:  Drink more fluids, at least 8-10 glasses (8 oz or 240 ml) daily.  For example: sports drinks, diluted fruit juices, soft drinks.  Supplement this with saltine crackers or soups to make certain that you are getting sufficient fluid and salt to meet your body's needs.  Nutrition:  Maintaining some food intake during episodes of diarrhea is important.  Ideal  initial foods include boiled starches/cereals (e.g., potatoes, rice, noodles, wheat, oats) with a small amount of salt to taste.  Other acceptable foods include: bananas, yogurt, crackers, soup.  As your stools return to normal consistency, resume a normal diet.  Call Back If:  Signs of dehydration occur (e.g., no urine for more than 12 hours, very dry mouth, lightheaded, etc.)  You become worse.  Patient Will Follow Care Advice:  YES

## 2014-03-15 ENCOUNTER — Other Ambulatory Visit: Payer: Self-pay | Admitting: Family Medicine

## 2014-03-17 ENCOUNTER — Other Ambulatory Visit: Payer: Self-pay | Admitting: Family Medicine

## 2014-03-17 MED ORDER — ALPRAZOLAM 0.5 MG PO TABS: ORAL_TABLET | ORAL | Status: DC

## 2014-03-17 NOTE — Telephone Encounter (Signed)
Rx called into pharmacy.  Patient is aware that he should go to the lab.

## 2014-03-17 NOTE — Telephone Encounter (Signed)
Call in #120 only. He needs testing and a contract

## 2014-03-28 ENCOUNTER — Telehealth: Payer: Self-pay | Admitting: *Deleted

## 2014-03-28 NOTE — Telephone Encounter (Signed)
Patient calling complaining of chest pain.  It has been going on for about 2 weeks off and on.  It has a "familiar feeling".  He is not sure if it is chest pain and or anxiety.  He does have a history of heart attack.    Per Dr Sarajane Jews.  Patient should go to the ER.  Patient is aware but unsure if he will go to the ER at this time.  Again advised patient to go to [facility] ER.

## 2014-03-29 ENCOUNTER — Telehealth: Payer: Self-pay | Admitting: Family Medicine

## 2014-03-29 NOTE — Telephone Encounter (Signed)
Patient Information:  Caller Name: Cheick  Phone: [phone]  Patient: Wayne Williams, Wayne Williams  Gender: Male  DOB: Oct 06, 1951  Age: 63 Years  PCP: Alysia [facility]  Office Follow Up:  Does the office need to follow up with this patient?: Yes  Instructions For The Office: Per EPIC appt not available today or tomorrow.  Pt refuses to see another provider and insist appt with Dr Sarajane Jews.  Please f/u with pt concerning appt needs or recommendations per provider. Thank you.  RN Note:  Per EPIC appt not available today or tomorrow.  Pt refuses to see another provider and insist appt with Dr Sarajane Jews.  Please f/u with pt concerning appt needs or recommendations per provider. Thank you.  Symptoms  Reason For Call & Symptoms: Pt reports he has hx of triple heart bypass in 2001.  Pt reports she has had some chest pain 2 days ago, "like when I had the heart attack."  Pt states he is not having any chest pain since 03/27/14.  Pt states he just wants an appt.  Reviewed Health History In EMR: Yes  Reviewed Medications In EMR: Yes  Reviewed Allergies In EMR: Yes  Reviewed Surgeries / Procedures: Yes  Date of Onset of Symptoms: 03/27/2014  Guideline(s) Used:  Chest Pain  Disposition Per Guideline:   See Today in Office  Reason For Disposition Reached:   Patient wants to be seen  Advice Given:  Call Back If:  You become worse.  Patient Will Follow Care Advice:  YES

## 2014-03-29 NOTE — Telephone Encounter (Signed)
Pt call to request appt with PCP. Call was transferred to triage for symptoms of chest pain that he has been experiencing for the past 2 weeks.

## 2014-03-29 NOTE — Telephone Encounter (Signed)
Noted  

## 2014-03-29 NOTE — Telephone Encounter (Signed)
Spoke with patient.  Per Dr Barbie Banner instructions yesterday patient should go to ER.  Patient is aware.  Patient was also offered an appointment with a different provider.  Patient states " I'm not having chest pains today but if I have chest pains I will go the the ER".  Patient declined to make an appointment with a different provider. FYI.

## 2014-04-13 ENCOUNTER — Encounter: Payer: Self-pay | Admitting: Family Medicine

## 2014-04-17 ENCOUNTER — Other Ambulatory Visit: Payer: Self-pay | Admitting: Family Medicine

## 2014-04-18 MED ORDER — ALPRAZOLAM 0.5 MG PO TABS: ORAL_TABLET | ORAL | Status: DC

## 2014-04-18 NOTE — Telephone Encounter (Signed)
I called in script 

## 2014-04-18 NOTE — Telephone Encounter (Signed)
Call in #[phone] with 5 rf 

## 2014-05-24 ENCOUNTER — Telehealth: Payer: Self-pay | Admitting: Family Medicine

## 2014-05-24 NOTE — Telephone Encounter (Signed)
[facility] [place], [place]. Is requesting is re-fill on tamsulosin (FLOMAX) 0.4 MG CAPS capsule

## 2014-05-26 MED ORDER — TAMSULOSIN HCL 0.4 MG PO CAPS: ORAL_CAPSULE | ORAL | Status: DC

## 2014-05-26 NOTE — Telephone Encounter (Signed)
I sent script e-scribe. 

## 2014-07-04 ENCOUNTER — Other Ambulatory Visit: Payer: Self-pay | Admitting: Family Medicine

## 2014-08-25 ENCOUNTER — Telehealth: Payer: Self-pay | Admitting: Family Medicine

## 2014-08-25 MED ORDER — AMOXICILLIN 875 MG PO TABS: 875.0000 mg | ORAL_TABLET | Freq: Two times a day (BID) | ORAL | Status: DC

## 2014-08-25 NOTE — Telephone Encounter (Signed)
Call in Amoxicillin 875 mg bid #20

## 2014-08-25 NOTE — Telephone Encounter (Signed)
Rx sent 

## 2014-08-25 NOTE — Telephone Encounter (Signed)
Pt is having swelling in left bottem jaw due to a tooth infection. Pt cannot be seen by dentist until Monday. Pt would like to know if dr Sarajane Jews will rx him an abx for the swelling. Pt refused to make appt first prior to asking. Pt states dr fry knows him. [facility]

## 2014-08-25 NOTE — Telephone Encounter (Signed)
Phone note route rachel

## 2014-08-25 NOTE — Telephone Encounter (Signed)
Please call pt once rx has been call into pharm [phone]

## 2014-08-25 NOTE — Telephone Encounter (Signed)
Pt is aware still waiting on md

## 2014-09-11 ENCOUNTER — Telehealth: Payer: Self-pay | Admitting: Family Medicine

## 2014-09-11 NOTE — Telephone Encounter (Signed)
Pt needs a medical statement to stop taking blood thin. Pt another round of abx [facility] [address]. Pt can not see dr Games developer until next wk 09-19-14. Dr Terence Lux fax [phone]. Please call pt if/when abx has been called into pharm

## 2014-09-12 NOTE — Telephone Encounter (Signed)
I will write a note to stop Plavix for 7 days prior to dental procedure and to resume it the day after. Call in another 14 days of Amoxicillin.

## 2014-09-12 NOTE — Telephone Encounter (Signed)
Pt was on Amoxicillin for a infected tooth, he has appointment next week to discuss tooth extraction. Please fax note to dentist office [phone] and phone # [phone]. Also call pt to give advice.

## 2014-09-12 NOTE — Telephone Encounter (Signed)
I need more info please. What is the antibiotic he was given and what is this treating? Why does he need a note to stop taking his Plavix briefly?

## 2014-09-13 MED ORDER — AMOXICILLIN 875 MG PO TABS: 875.0000 mg | ORAL_TABLET | Freq: Two times a day (BID) | ORAL | Status: DC

## 2014-09-13 NOTE — Telephone Encounter (Signed)
I spoke with pt, sent script e-scribe and faxed note to below number.

## 2014-09-20 ENCOUNTER — Other Ambulatory Visit: Payer: Self-pay | Admitting: Family Medicine

## 2014-09-25 ENCOUNTER — Ambulatory Visit (INDEPENDENT_AMBULATORY_CARE_PROVIDER_SITE_OTHER): Payer: Medicare Other | Admitting: Family Medicine

## 2014-09-25 ENCOUNTER — Encounter: Payer: Self-pay | Admitting: Family Medicine

## 2014-09-25 VITALS — BP 114/63 | HR 73 | Temp 98.6°F | Ht 72.0 in | Wt 160.0 lb

## 2014-09-25 DIAGNOSIS — N139 Obstructive and reflux uropathy, unspecified: Secondary | ICD-10-CM

## 2014-09-25 DIAGNOSIS — E785 Hyperlipidemia, unspecified: Secondary | ICD-10-CM

## 2014-09-25 DIAGNOSIS — E119 Type 2 diabetes mellitus without complications: Secondary | ICD-10-CM

## 2014-09-25 DIAGNOSIS — N138 Other obstructive and reflux uropathy: Secondary | ICD-10-CM

## 2014-09-25 DIAGNOSIS — I10 Essential (primary) hypertension: Secondary | ICD-10-CM

## 2014-09-25 DIAGNOSIS — I2510 Atherosclerotic heart disease of native coronary artery without angina pectoris: Secondary | ICD-10-CM

## 2014-09-25 DIAGNOSIS — N401 Enlarged prostate with lower urinary tract symptoms: Secondary | ICD-10-CM

## 2014-09-25 LAB — HEPATIC FUNCTION PANEL
ALT: 21 U/L (ref 0–53)
AST: 27 U/L (ref 0–37)
Albumin: 4.3 g/dL (ref 3.5–5.2)
Alkaline Phosphatase: 58 U/L (ref 39–117)
Bilirubin, Direct: 0.1 mg/dL (ref 0.0–0.3)
Total Bilirubin: 0.5 mg/dL (ref 0.2–1.2)
Total Protein: 6.9 g/dL (ref 6.0–8.3)

## 2014-09-25 LAB — PSA: PSA: 0.93 ng/mL (ref 0.10–4.00)

## 2014-09-25 LAB — CBC WITH DIFFERENTIAL/PLATELET
Basophils Absolute: 0.1 10*3/uL (ref 0.0–0.1)
Basophils Relative: 0.9 % (ref 0.0–3.0)
Eosinophils Absolute: 0.4 10*3/uL (ref 0.0–0.7)
Eosinophils Relative: 5.1 % — ABNORMAL HIGH (ref 0.0–5.0)
HCT: 40.7 % (ref 39.0–52.0)
Hemoglobin: 13.7 g/dL (ref 13.0–17.0)
Lymphocytes Relative: 37 % (ref 12.0–46.0)
Lymphs Abs: 2.8 10*3/uL (ref 0.7–4.0)
MCHC: 33.7 g/dL (ref 30.0–36.0)
MCV: 92.8 fl (ref 78.0–100.0)
Monocytes Absolute: 0.6 10*3/uL (ref 0.1–1.0)
Monocytes Relative: 8.2 % (ref 3.0–12.0)
Neutro Abs: 3.7 10*3/uL (ref 1.4–7.7)
Neutrophils Relative %: 48.8 % (ref 43.0–77.0)
Platelets: 276 10*3/uL (ref 150.0–400.0)
RBC: 4.39 Mil/uL (ref 4.22–5.81)
RDW: 13.6 % (ref 11.5–15.5)
WBC: 7.7 10*3/uL (ref 4.0–10.5)

## 2014-09-25 LAB — LIPID PANEL
Cholesterol: 108 mg/dL (ref 0–200)
HDL: 33.4 mg/dL — ABNORMAL LOW (ref >39.00)
LDL Cholesterol: 47 mg/dL (ref 0–99)
NonHDL: 74.6
Total CHOL/HDL Ratio: 3
Triglycerides: 138 mg/dL (ref 0.0–149.0)
VLDL: 27.6 mg/dL (ref 0.0–40.0)

## 2014-09-25 LAB — POCT URINALYSIS DIPSTICK
Bilirubin, UA: NEGATIVE
Glucose, UA: NEGATIVE
Ketones, UA: NEGATIVE
Leukocytes, UA: NEGATIVE
Nitrite, UA: NEGATIVE
Spec Grav, UA: <=1.005
Urobilinogen, UA: 0.2
pH, UA: 5.5

## 2014-09-25 LAB — BASIC METABOLIC PANEL
BUN: 14 mg/dL (ref 6–23)
CO2: 30 mEq/L (ref 19–32)
Calcium: 9.3 mg/dL (ref 8.4–10.5)
Chloride: 100 mEq/L (ref 96–112)
Creatinine, Ser: 1.1 mg/dL (ref 0.4–1.5)
GFR: 68.4 mL/min (ref >60.00)
Glucose, Bld: 81 mg/dL (ref 70–99)
Potassium: 5.2 mEq/L — ABNORMAL HIGH (ref 3.5–5.1)
Sodium: 136 mEq/L (ref 135–145)

## 2014-09-25 LAB — TSH: TSH: 1.66 u[IU]/mL (ref 0.35–4.50)

## 2014-09-25 LAB — HEMOGLOBIN A1C: Hgb A1c MFr Bld: 6.3 % (ref 4.6–6.5)

## 2014-09-25 NOTE — Progress Notes (Signed)
   Subjective:    Patient ID: Wayne Williams, male    DOB: [birth year], 63 y.o.   MRN: [id number]  HPI Here to follow up and to ask about a referral to see Cardiology again. It has been a few years since he saw Dr. Percival Spanish. He has done well but he does mention some occasional slight pressure in the chest after exercise, such as when going up one flight of steps in his house. No SOB.    Review of Systems  Constitutional: Negative.   Respiratory: Positive for chest tightness. Negative for shortness of breath.   Cardiovascular: Negative.   Neurological: Negative.        Objective:   Physical Exam  Constitutional: He appears well-developed and well-nourished.  Neck: No thyromegaly present.  Cardiovascular: Normal rate, regular rhythm, normal heart sounds and intact distal pulses.   Pulmonary/Chest: Effort normal and breath sounds normal.  Lymphadenopathy:    He has no cervical adenopathy.          Assessment & Plan:  Get fasting labs today. Refer back to Dr. Percival Spanish.

## 2014-09-25 NOTE — Progress Notes (Signed)
Pre visit review using our clinic review tool, if applicable. No additional management support is needed unless otherwise documented below in the visit note. 

## 2014-09-26 LAB — MICROALBUMIN / CREATININE URINE RATIO
Creatinine,U: 44.2 mg/dL
Microalb Creat Ratio: 19 mg/g (ref 0.0–30.0)
Microalb, Ur: 8.4 mg/dL — ABNORMAL HIGH (ref 0.0–1.9)

## 2014-10-12 ENCOUNTER — Other Ambulatory Visit: Payer: Self-pay | Admitting: Family Medicine

## 2014-10-20 ENCOUNTER — Other Ambulatory Visit: Payer: Self-pay | Admitting: Family Medicine

## 2014-10-23 NOTE — Telephone Encounter (Signed)
Call in #[phone] with 5 rf 

## 2014-10-24 ENCOUNTER — Other Ambulatory Visit: Payer: Self-pay | Admitting: Family Medicine

## 2014-10-27 ENCOUNTER — Ambulatory Visit (INDEPENDENT_AMBULATORY_CARE_PROVIDER_SITE_OTHER): Payer: Medicare Other | Admitting: Cardiology

## 2014-10-27 ENCOUNTER — Encounter: Payer: Self-pay | Admitting: Cardiology

## 2014-10-27 VITALS — BP 112/80 | HR 68 | Ht 72.0 in | Wt 161.0 lb

## 2014-10-27 DIAGNOSIS — R079 Chest pain, unspecified: Secondary | ICD-10-CM

## 2014-10-27 DIAGNOSIS — F17210 Nicotine dependence, cigarettes, uncomplicated: Secondary | ICD-10-CM

## 2014-10-27 MED ORDER — NITROGLYCERIN 0.4 MG SL SUBL: 0.4000 mg | SUBLINGUAL_TABLET | SUBLINGUAL | Status: DC | PRN

## 2014-10-27 MED ORDER — ISOSORBIDE MONONITRATE ER 30 MG PO TB24: 30.0000 mg | ORAL_TABLET | Freq: Every day | ORAL | Status: DC

## 2014-10-27 NOTE — Patient Instructions (Signed)
Your physician recommends that you schedule a follow-up appointment in: one year with Dr. Percival Spanish  We are ordering a stress test  Start taking Imdur 30 mg as directed  Start taking NTG as directed

## 2014-10-27 NOTE — Progress Notes (Signed)
HPI The patient presents for followup of coronary disease. It has been several years since I saw him.  He comes back because she's been having chest discomfort for about 6 months. This happens with exertion such as climbing his stairs. It is about 3/10 in intensity. Does not happen every time he does this.  He does not have any associated symptoms such as nausea vomiting or diaphoresis. Seems to be somewhat stable pattern over 6 months. It goes away after a few minutes of rest. He denies any acute shortness of breath. He's not having any PND or orthopnea. He does continue to smoke cigarettes.    No Known Allergies  Current Outpatient Prescriptions  Medication Sig Dispense Refill  . ALPRAZolam (XANAX) 0.5 MG tablet TAKE ONE TABLET BY MOUTH 4 TIMES DAILY AS NEEDED  120 tablet  5  . aspirin 81 MG tablet Take 81 mg by mouth daily.        . clopidogrel (PLAVIX) 75 MG tablet TAKE ONE TABLET BY MOUTH ONCE DAILY  90 tablet  1  . metFORMIN (GLUCOPHAGE) 1000 MG tablet TAKE ONE TABLET BY MOUTH TWICE DAILY WITH MEALS  60 tablet  11  . metoprolol (LOPRESSOR) 50 MG tablet TAKE ONE TABLET BY MOUTH TWICE DAILY  180 tablet  1  . simvastatin (ZOCOR) 40 MG tablet TAKE ONE TABLET BY MOUTH AT BEDTIME  90 tablet  3  . tamsulosin (FLOMAX) 0.4 MG CAPS capsule TAKE ONE CAPSULE BY MOUTH ONCE DAILY  30 capsule  1  . lisinopril (PRINIVIL,ZESTRIL) 5 MG tablet Take 1 tablet (5 mg total) by mouth daily.  30 tablet  11   No current facility-administered medications for this visit.    Past Medical History  Diagnosis Date  . Anxiety   . Insomnia   . Hypertension   . Cerebrovascular accident     hx of 06 20 09  . GERD (gastroesophageal reflux disease)   . ED (erectile dysfunction)   . Depression     saw Dr. Casimiro Needle in the past  . CAD (coronary artery disease)     s/p cabg x 4  . Diabetes mellitus     Past Surgical History  Procedure Laterality Date  . Lumbar laminectomy  1995    per Dr. Gladstone Lighter  . Cervical  laminectomy  1996    per Dr. Hal Neer  . Heart bypass  2001    Family History  Problem Relation Age of Onset  . Coronary artery disease    . Hypertension      History   Social History  . Marital Status: Divorced    Spouse Name: N/A    Number of Children: N/A  . Years of Education: N/A   Occupational History  . Not on file.   Social History Main Topics  . Smoking status: Current Every Day Smoker -- 1.00 packs/day for 50 years    Types: Cigarettes  . Smokeless tobacco: Never Used  . Alcohol Use: Yes     Comment: rare  . Drug Use: No  . Sexual Activity: Not on file   Other Topics Concern  . Not on file   Social History Narrative   Disabled   Divorced, lives alone          ROS:  As stated in the HPI and negative for all other systems.  PHYSICAL EXAM BP 112/80  Pulse 68  Ht 6' (1.829 m)  Wt 161 lb (73.029 kg)  BMI 21.83 kg/m2 GENERAL:  Well  appearing HEENT:  Pupils equal round and reactive, fundi not visualized, oral mucosa unremarkable NECK:  No jugular venous distention, waveform within normal limits, carotid upstroke brisk and symmetric, no bruits, no thyromegaly LYMPHATICS:  No cervical, inguinal adenopathy LUNGS:  Clear to auscultation bilaterally BACK:  No CVA tenderness CHEST:  Unremarkable HEART:  PMI not displaced or sustained,S1 and S2 within normal limits, no S3, no S4, no clicks, no rubs, no murmurs ABD:  Flat, positive bowel sounds normal in frequency in pitch, no bruits, no rebound, no guarding, no midline pulsatile mass, no hepatomegaly, no splenomegaly EXT:  2 plus pulses throughout, no edema, no cyanosis no clubbing SKIN:  No rashes no nodules NEURO:  Cranial nerves II through XII grossly intact, motor grossly intact throughout PSYCH:  Cognitively intact, oriented to person place and time   EKG:  NSR,  Rate 68, LBBB.  Left bundle branch block at rest from incomplete left bundle branch block on the last EKG.  ASSESSMENT AND PLAN  CAD:  He  clearly is having exertional angina. I will start with a stress perfusion study to risk stratify. I will start him on Imdur 30 mg and give him a prescription for sublingual nitroglycerin. He understands if the symptoms worsen he needs to present to the emergency room.   DYSLIPIDEMIA:  His lipids are at target.  He will continue the meds as listed.  Lab Results  Component Value Date   CHOL 108 09/25/2014   TRIG 138.0 09/25/2014   HDL 33.40* 09/25/2014   LDLCALC 47 09/25/2014    DM:  His blood sugar is at target.  He will continue on meds as listed.  Lab Results  Component Value Date   HGBA1C 6.3 09/25/2014   TOBACCO ABUSE: We had a long discussion about this.  He is not ready to quit.  We discussed a specific strategy for tobacco cessation.  (Greater than three minutes discussing tobacco cessation.

## 2014-11-02 ENCOUNTER — Telehealth (HOSPITAL_COMMUNITY): Payer: Self-pay

## 2014-11-02 NOTE — Telephone Encounter (Signed)
Encounter complete. 

## 2014-11-07 ENCOUNTER — Encounter (HOSPITAL_COMMUNITY): Payer: Medicare Other

## 2014-11-20 ENCOUNTER — Other Ambulatory Visit: Payer: Self-pay | Admitting: Family Medicine

## 2014-12-27 ENCOUNTER — Telehealth (HOSPITAL_COMMUNITY): Payer: Self-pay

## 2014-12-27 NOTE — Telephone Encounter (Signed)
Encounter complete. 

## 2015-01-02 ENCOUNTER — Ambulatory Visit (HOSPITAL_COMMUNITY)
Admission: RE | Admit: 2015-01-02 | Discharge: 2015-01-02 | Disposition: A | Discharge status: Home / Self Care | Payer: Medicare Other | Source: Ambulatory Visit | Attending: Cardiovascular Disease | Admitting: Cardiovascular Disease

## 2015-01-02 DIAGNOSIS — I10 Essential (primary) hypertension: Secondary | ICD-10-CM | POA: Insufficient documentation

## 2015-01-02 DIAGNOSIS — R002 Palpitations: Secondary | ICD-10-CM | POA: Diagnosis not present

## 2015-01-02 DIAGNOSIS — R0609 Other forms of dyspnea: Secondary | ICD-10-CM | POA: Insufficient documentation

## 2015-01-02 DIAGNOSIS — R079 Chest pain, unspecified: Secondary | ICD-10-CM | POA: Insufficient documentation

## 2015-01-02 DIAGNOSIS — R42 Dizziness and giddiness: Secondary | ICD-10-CM | POA: Insufficient documentation

## 2015-01-02 DIAGNOSIS — E785 Hyperlipidemia, unspecified: Secondary | ICD-10-CM | POA: Diagnosis not present

## 2015-01-02 DIAGNOSIS — Z720 Tobacco use: Secondary | ICD-10-CM | POA: Diagnosis not present

## 2015-01-02 DIAGNOSIS — I447 Left bundle-branch block, unspecified: Secondary | ICD-10-CM | POA: Diagnosis not present

## 2015-01-02 DIAGNOSIS — Z8249 Family history of ischemic heart disease and other diseases of the circulatory system: Secondary | ICD-10-CM | POA: Insufficient documentation

## 2015-01-02 DIAGNOSIS — E119 Type 2 diabetes mellitus without complications: Secondary | ICD-10-CM | POA: Insufficient documentation

## 2015-01-02 MED ORDER — TECHNETIUM TC 99M SESTAMIBI GENERIC - CARDIOLITE
32.1000 | Freq: Once | INTRAVENOUS | Status: AC | PRN
  Administered 2015-01-02: 32.1 via INTRAVENOUS

## 2015-01-02 MED ORDER — AMINOPHYLLINE 25 MG/ML IV SOLN
125.0000 mg | Freq: Once | INTRAVENOUS | Status: AC
  Administered 2015-01-02: 125 mg via INTRAVENOUS

## 2015-01-02 MED ORDER — REGADENOSON 0.4 MG/5ML IV SOLN
0.4000 mg | Freq: Once | INTRAVENOUS | Status: AC
  Administered 2015-01-02: 0.4 mg via INTRAVENOUS

## 2015-01-02 MED ORDER — TECHNETIUM TC 99M SESTAMIBI GENERIC - CARDIOLITE
10.7000 | Freq: Once | INTRAVENOUS | Status: AC | PRN
  Administered 2015-01-02: 11 via INTRAVENOUS

## 2015-01-02 NOTE — Procedures (Addendum)
[facility] [address] [place] [address] [phone]  Cardiology Nuclear Med Study  Wayne Williams is a 64 y.o. male     MRN : [id number]     DOB: Jun 25, 1951  Procedure Date: 01/02/2015  Nuclear Med Background Indication for Stress Test:  Graft Patency History:  CAD;MI-2001;CABG X4-2001;No prior respiratory history reported;No prior NUC MPI for comparison. Cardiac Risk Factors: CVA, Family History - CAD, Hypertension, LBBB, Lipids, NIDDM and Smoker  Symptoms:  Chest Pain, DOE, Light-Headedness and Palpitations   Nuclear Pre-Procedure Caffeine/Decaff Intake:  12:00am NPO After: 10am   IV Site: R Forearm  IV 0.9% NS with Angio Cath:  22g  Chest Size (in):  40" IV Started by: Rolene Course, RN  Height: 6' (1.829 m)  Cup Size: n/a  BMI:  Body mass index is 21.83 kg/(m^2). Weight:  161 lb (73.029 kg)   Tech Comments:  n/a    Nuclear Med Study 1 or 2 day study: 1 day  Stress Test Type:  [place]  Order Authorizing Provider:  Minus Breeding, MD   Resting Radionuclide: Technetium 39m Sestamibi  Resting Radionuclide Dose: 10.7 mCi   Stress Radionuclide:  Technetium 15m Sestamibi  Stress Radionuclide Dose: 32.1 mCi           Stress Protocol Rest HR:71 Stress HR:99  Rest BP: *129/72 Stress BP: 140/79  Exercise Time (min): n/a METS: n/a          Dose of Adenosine (mg):  n/a Dose of Lexiscan: 0.4 mg  Dose of Atropine (mg): n/a Dose of Dobutamine: n/a mcg/kg/min (at max HR)  Stress Test Technologist: Mellody Memos, CCT Nuclear Technologist: Imagene Riches, CNMT   Rest Procedure:  Myocardial perfusion imaging was performed at rest 45 minutes following the intravenous administration of Technetium 29m Sestamibi. Stress Procedure:  The patient received IV Lexiscan 0.4 mg over 15-seconds.  Technetium 28m Sestamibi injected IV at 30-seconds.  Patient experienced shortness of breath, light-headedness, a drop in blood pressure  and was administered 125 mg of Aminophylline IV. There were no significant changes with Lexiscan.  Quantitative spect images were obtained after a 45 minute delay.  Transient Ischemic Dilatation (Normal <1.22):  1.12  QGS EDV:  145 ml QGS ESV:  86 ml LV Ejection Fraction: 41%        Rest ECG: NSR-LBBB  Stress ECG: No significant change from baseline ECG  QPS Raw Data Images:  Normal; no motion artifact; normal heart/lung ratio. Stress Images:  Normal homogeneous uptake in all areas of the myocardium. Rest Images:  Normal homogeneous uptake in all areas of the myocardium. Subtraction (SDS):  No evidence of ischemia.  Impression Exercise Capacity:  Lexiscan with no exercise. BP Response:  Hypotensive blood pressure response. Clinical Symptoms:  No significant symptoms noted. ECG Impression:  No significant ST segment change suggestive of ischemia. Comparison with Prior Nuclear Study: No previous nuclear study performed  Overall Impression:  Normal stress nuclear study.  LV Wall Motion:  Moderate global hypokinesia   Lorretta Harp, MD  01/02/2015 4:13 PM

## 2015-01-08 ENCOUNTER — Other Ambulatory Visit: Payer: Self-pay

## 2015-01-08 DIAGNOSIS — I252 Old myocardial infarction: Secondary | ICD-10-CM

## 2015-01-16 ENCOUNTER — Ambulatory Visit (HOSPITAL_COMMUNITY)
Admission: RE | Admit: 2015-01-16 | Discharge: 2015-01-16 | Disposition: A | Discharge status: Home / Self Care | Payer: Medicare Other | Source: Ambulatory Visit | Attending: Cardiology | Admitting: Cardiology

## 2015-01-16 DIAGNOSIS — I10 Essential (primary) hypertension: Secondary | ICD-10-CM | POA: Insufficient documentation

## 2015-01-16 DIAGNOSIS — I2510 Atherosclerotic heart disease of native coronary artery without angina pectoris: Secondary | ICD-10-CM | POA: Diagnosis not present

## 2015-01-16 DIAGNOSIS — E119 Type 2 diabetes mellitus without complications: Secondary | ICD-10-CM | POA: Insufficient documentation

## 2015-01-16 DIAGNOSIS — I359 Nonrheumatic aortic valve disorder, unspecified: Secondary | ICD-10-CM

## 2015-01-16 DIAGNOSIS — I252 Old myocardial infarction: Secondary | ICD-10-CM

## 2015-01-16 NOTE — Progress Notes (Signed)
2D Echo Performed 01/16/2015    Marygrace Drought, RCS

## 2015-01-17 ENCOUNTER — Other Ambulatory Visit: Payer: Self-pay | Admitting: Family Medicine

## 2015-01-23 ENCOUNTER — Ambulatory Visit (INDEPENDENT_AMBULATORY_CARE_PROVIDER_SITE_OTHER): Payer: Medicare Other | Admitting: Cardiology

## 2015-01-23 ENCOUNTER — Ambulatory Visit
Admission: RE | Admit: 2015-01-23 | Discharge: 2015-01-23 | Disposition: A | Discharge status: Home / Self Care | Payer: Medicare Other | Source: Ambulatory Visit | Attending: Cardiology | Admitting: Cardiology

## 2015-01-23 ENCOUNTER — Encounter: Payer: Self-pay | Admitting: Cardiology

## 2015-01-23 VITALS — BP 123/69 | HR 73 | Ht 72.0 in | Wt 159.5 lb

## 2015-01-23 DIAGNOSIS — Z79899 Other long term (current) drug therapy: Secondary | ICD-10-CM | POA: Diagnosis not present

## 2015-01-23 DIAGNOSIS — R0602 Shortness of breath: Secondary | ICD-10-CM

## 2015-01-23 DIAGNOSIS — R079 Chest pain, unspecified: Secondary | ICD-10-CM | POA: Diagnosis not present

## 2015-01-23 DIAGNOSIS — D689 Coagulation defect, unspecified: Secondary | ICD-10-CM | POA: Diagnosis not present

## 2015-01-23 DIAGNOSIS — J449 Chronic obstructive pulmonary disease, unspecified: Secondary | ICD-10-CM | POA: Diagnosis not present

## 2015-01-23 DIAGNOSIS — Z01812 Encounter for preprocedural laboratory examination: Secondary | ICD-10-CM

## 2015-01-23 DIAGNOSIS — I255 Ischemic cardiomyopathy: Secondary | ICD-10-CM | POA: Diagnosis not present

## 2015-01-23 LAB — COMPLETE METABOLIC PANEL WITH GFR
ALT: 15 U/L (ref 0–53)
AST: 18 U/L (ref 0–37)
Albumin: 4.2 g/dL (ref 3.5–5.2)
Alkaline Phosphatase: 51 U/L (ref 39–117)
BUN: 13 mg/dL (ref 6–23)
CO2: 28 mEq/L (ref 19–32)
Calcium: 9.6 mg/dL (ref 8.4–10.5)
Chloride: 101 mEq/L (ref 96–112)
Creat: 1.03 mg/dL (ref 0.50–1.35)
GFR, Est African American: 88 mL/min
GFR, Est Non African American: 76 mL/min
Glucose, Bld: 101 mg/dL — ABNORMAL HIGH (ref 70–99)
Potassium: 4.7 mEq/L (ref 3.5–5.3)
Sodium: 136 mEq/L (ref 135–145)
Total Bilirubin: 0.4 mg/dL (ref 0.2–1.2)
Total Protein: 6.9 g/dL (ref 6.0–8.3)

## 2015-01-23 LAB — CBC
HCT: 39.5 % (ref 39.0–52.0)
Hemoglobin: 13.3 g/dL (ref 13.0–17.0)
MCH: 30.9 pg (ref 26.0–34.0)
MCHC: 33.7 g/dL (ref 30.0–36.0)
MCV: 91.6 fL (ref 78.0–100.0)
MPV: 8.9 fL (ref 8.6–12.4)
Platelets: 283 10*3/uL (ref 150–400)
RBC: 4.31 MIL/uL (ref 4.22–5.81)
RDW: 14.1 % (ref 11.5–15.5)
WBC: 9.6 10*3/uL (ref 4.0–10.5)

## 2015-01-23 LAB — PROTIME-INR
INR: 1.06 (ref <1.50)
Prothrombin Time: 13.8 seconds (ref 11.6–15.2)

## 2015-01-23 LAB — APTT: aPTT: 37 seconds (ref 24–37)

## 2015-01-23 NOTE — Patient Instructions (Addendum)
We have ordered  Cath for you to get done at the Hospital you will be given a time to be there as you leave today  You can have your labs done as you leave today  You will be instructed on when to come back to see Dr. Percival Spanish after your cath is completed  You can get your chest -  X ay today at [facility]

## 2015-01-23 NOTE — Progress Notes (Signed)
HPI The patient presents for followup of coronary disease.  The patient was seen recently for evaluation of chest discomfort that was happening for about the past 6 months. It would happen with activity such as climbing stairs. I did send him for a stress perfusion study which did not suggest ischemia. However, he had global hypokinesis. Follow-up echocardiography demonstrated that his ejection fraction was now 35-40%.  I brought him back to discuss this. His daughter came with him. He's continuing to have chest discomfort such as when he climbs the stairs in his house. He's getting himself ready for bed he notices some discomfort in his chest and right arm. He might have this when he is chasing a grandchild.  He does not describe associated symptoms such as nausea vomiting or diaphoresis. He's not noticed any palpitations, presyncope or syncope. He's had no weight gain or edema.   No Known Allergies  Current Outpatient Prescriptions  Medication Sig Dispense Refill  . ALPRAZolam (XANAX) 0.5 MG tablet TAKE ONE TABLET BY MOUTH 4 TIMES DAILY AS NEEDED 120 tablet 5  . aspirin 81 MG tablet Take 81 mg by mouth daily.      . clopidogrel (PLAVIX) 75 MG tablet TAKE ONE TABLET BY MOUTH ONCE DAILY 90 tablet 1  . isosorbide mononitrate (IMDUR) 30 MG 24 hr tablet Take 1 tablet (30 mg total) by mouth daily. 90 tablet 3  . metFORMIN (GLUCOPHAGE) 1000 MG tablet TAKE ONE TABLET BY MOUTH TWICE DAILY WITH MEALS 60 tablet 11  . metoprolol (LOPRESSOR) 50 MG tablet TAKE ONE TABLET BY MOUTH TWICE DAILY 180 tablet 1  . nitroGLYCERIN (NITROSTAT) 0.4 MG SL tablet Place 1 tablet (0.4 mg total) under the tongue every 5 (five) minutes as needed for chest pain. 90 tablet 3  . simvastatin (ZOCOR) 40 MG tablet TAKE ONE TABLET BY MOUTH AT BEDTIME 90 tablet 3  . tamsulosin (FLOMAX) 0.4 MG CAPS capsule TAKE ONE CAPSULE BY MOUTH ONCE DAILY 30 capsule 6   No current facility-administered medications for this visit.    Past  Medical History  Diagnosis Date  . Anxiety   . Insomnia   . Hypertension   . Cerebrovascular accident     hx of 06 20 09  . GERD (gastroesophageal reflux disease)   . ED (erectile dysfunction)   . Depression     saw Dr. Casimiro Needle in the past  . CAD (coronary artery disease)     s/p cabg x 4  . Diabetes mellitus     Past Surgical History  Procedure Laterality Date  . Lumbar laminectomy  1995    per Dr. Gladstone Lighter  . Cervical laminectomy  1996    per Dr. Hal Neer  . Heart bypass  2001    Family History  Problem Relation Age of Onset  . Coronary artery disease    . Hypertension      History   Social History  . Marital Status: Divorced    Spouse Name: N/A    Number of Children: N/A  . Years of Education: N/A   Occupational History  . Not on file.   Social History Main Topics  . Smoking status: Current Every Day Smoker -- 1.00 packs/day for 50 years    Types: Cigarettes  . Smokeless tobacco: Never Used  . Alcohol Use: Yes     Comment: rare  . Drug Use: No  . Sexual Activity: Not on file   Other Topics Concern  . Not on file   Social  History Narrative   Disabled   Divorced, lives alone          ROS:  As stated in the HPI and negative for all other systems.  PHYSICAL EXAM BP 123/69 mmHg  Pulse 73  Ht 6' (1.829 m)  Wt 159 lb 8 oz (72.349 kg)  BMI 21.63 kg/m2 GENERAL:  Well appearing HEENT:  Pupils equal round and reactive, fundi not visualized, oral mucosa unremarkable NECK:  No jugular venous distention, waveform within normal limits, carotid upstroke brisk and symmetric, no bruits, no thyromegaly LYMPHATICS:  No cervical, inguinal adenopathy LUNGS:  Clear to auscultation bilaterally BACK:  No CVA tenderness CHEST:  Unremarkable HEART:  PMI not displaced or sustained,S1 and S2 within normal limits, no S3, no S4, no clicks, no rubs, no murmurs ABD:  Flat, positive bowel sounds normal in frequency in pitch, no bruits, no rebound, no guarding, no midline  pulsatile mass, no hepatomegaly, no splenomegaly EXT:  2 plus pulses throughout, no edema, no cyanosis no clubbing SKIN:  No rashes no nodules NEURO:  Cranial nerves II through XII grossly intact, motor grossly intact throughout PSYCH:  Cognitively intact, oriented to person place and time    ASSESSMENT AND PLAN  CAD:  He clearly is having exertional angina.  His stress test demonstrates a new cardiomyopathy. His EF was very slightly reduced in 2009 but is now 35-40%. Given this and his ongoing symptoms cardiac catheterization is indicated. I discussed at length risks benefits. The patient understands that risks included but are not limited to stroke (1 in 1000), death (1 in 37), kidney failure [usually temporary] (1 in 500), bleeding (1 in 200), allergic reaction [possibly serious] (1 in 200).  The patient understands and agrees to proceed.   DYSLIPIDEMIA:  His lipids are at target.  He will continue the meds as listed.   DM:  His blood sugar is at target.  He will continue on meds as listed.   TOBACCO ABUSE:   We had a long discussion about this.  He is not ready to quit.

## 2015-01-24 ENCOUNTER — Ambulatory Visit (HOSPITAL_COMMUNITY)
Admission: RE | Admit: 2015-01-24 | Discharge: 2015-01-24 | Disposition: A | Discharge status: Home / Self Care | Payer: Medicare Other | Source: Ambulatory Visit | Attending: Cardiovascular Disease | Admitting: Cardiovascular Disease

## 2015-01-24 ENCOUNTER — Encounter (HOSPITAL_COMMUNITY): Payer: Self-pay | Admitting: Cardiovascular Disease

## 2015-01-24 ENCOUNTER — Encounter (HOSPITAL_COMMUNITY)
Admission: RE | Disposition: A | Discharge status: Home / Self Care | Payer: Self-pay | Source: Ambulatory Visit | Attending: Cardiovascular Disease

## 2015-01-24 DIAGNOSIS — E119 Type 2 diabetes mellitus without complications: Secondary | ICD-10-CM | POA: Diagnosis not present

## 2015-01-24 DIAGNOSIS — Z7982 Long term (current) use of aspirin: Secondary | ICD-10-CM | POA: Insufficient documentation

## 2015-01-24 DIAGNOSIS — I10 Essential (primary) hypertension: Secondary | ICD-10-CM | POA: Diagnosis not present

## 2015-01-24 DIAGNOSIS — I2510 Atherosclerotic heart disease of native coronary artery without angina pectoris: Secondary | ICD-10-CM | POA: Diagnosis not present

## 2015-01-24 DIAGNOSIS — Y909 Presence of alcohol in blood, level not specified: Secondary | ICD-10-CM | POA: Insufficient documentation

## 2015-01-24 DIAGNOSIS — I519 Heart disease, unspecified: Secondary | ICD-10-CM | POA: Diagnosis not present

## 2015-01-24 DIAGNOSIS — F329 Major depressive disorder, single episode, unspecified: Secondary | ICD-10-CM | POA: Diagnosis not present

## 2015-01-24 DIAGNOSIS — I25709 Atherosclerosis of coronary artery bypass graft(s), unspecified, with unspecified angina pectoris: Secondary | ICD-10-CM | POA: Diagnosis not present

## 2015-01-24 DIAGNOSIS — F419 Anxiety disorder, unspecified: Secondary | ICD-10-CM | POA: Diagnosis not present

## 2015-01-24 DIAGNOSIS — F17210 Nicotine dependence, cigarettes, uncomplicated: Secondary | ICD-10-CM | POA: Insufficient documentation

## 2015-01-24 DIAGNOSIS — Z8673 Personal history of transient ischemic attack (TIA), and cerebral infarction without residual deficits: Secondary | ICD-10-CM | POA: Insufficient documentation

## 2015-01-24 DIAGNOSIS — Z951 Presence of aortocoronary bypass graft: Secondary | ICD-10-CM | POA: Diagnosis not present

## 2015-01-24 DIAGNOSIS — E785 Hyperlipidemia, unspecified: Secondary | ICD-10-CM | POA: Diagnosis not present

## 2015-01-24 DIAGNOSIS — R079 Chest pain, unspecified: Secondary | ICD-10-CM | POA: Diagnosis present

## 2015-01-24 DIAGNOSIS — K219 Gastro-esophageal reflux disease without esophagitis: Secondary | ICD-10-CM | POA: Diagnosis not present

## 2015-01-24 DIAGNOSIS — F1099 Alcohol use, unspecified with unspecified alcohol-induced disorder: Secondary | ICD-10-CM | POA: Insufficient documentation

## 2015-01-24 HISTORY — PX: LEFT HEART CATHETERIZATION WITH CORONARY/GRAFT ANGIOGRAM: SHX5450

## 2015-01-24 LAB — GLUCOSE, CAPILLARY
Glucose-Capillary: 89 mg/dL (ref 70–99)
Glucose-Capillary: 77 mg/dL (ref 70–99)

## 2015-01-24 SURGERY — LEFT HEART CATHETERIZATION WITH CORONARY/GRAFT ANGIOGRAM
Anesthesia: LOCAL

## 2015-01-24 MED ORDER — NITROGLYCERIN 1 MG/10 ML FOR IR/CATH LAB
INTRA_ARTERIAL | Status: AC
  Filled 2015-01-24: qty 10

## 2015-01-24 MED ORDER — LIDOCAINE HCL (PF) 1 % IJ SOLN
INTRAMUSCULAR | Status: AC
Start: 2015-01-24 — End: 2015-01-24
  Filled 2015-01-24: qty 30

## 2015-01-24 MED ORDER — HEPARIN (PORCINE) IN NACL 2-0.9 UNIT/ML-% IJ SOLN
INTRAMUSCULAR | Status: AC
  Filled 2015-01-24: qty 1000

## 2015-01-24 MED ORDER — SODIUM CHLORIDE 0.9 % IV SOLN: 250.0000 mL | INTRAVENOUS | Status: DC | PRN

## 2015-01-24 MED ORDER — ASPIRIN 81 MG PO CHEW
81.0000 mg | CHEWABLE_TABLET | ORAL | Status: AC
  Administered 2015-01-24: 81 mg via ORAL

## 2015-01-24 MED ORDER — SODIUM CHLORIDE 0.9 % IV SOLN: INTRAVENOUS | Status: DC

## 2015-01-24 MED ORDER — ASPIRIN 81 MG PO CHEW
CHEWABLE_TABLET | ORAL | Status: AC
  Administered 2015-01-24: 81 mg via ORAL
  Filled 2015-01-24: qty 1

## 2015-01-24 MED ORDER — SODIUM CHLORIDE 0.9 % IV SOLN
INTRAVENOUS | Status: DC
  Administered 2015-01-24: 12:00:00 via INTRAVENOUS

## 2015-01-24 MED ORDER — FENTANYL CITRATE 0.05 MG/ML IJ SOLN
INTRAMUSCULAR | Status: AC
  Filled 2015-01-24: qty 2

## 2015-01-24 MED ORDER — SODIUM CHLORIDE 0.9 % IJ SOLN: 3.0000 mL | INTRAMUSCULAR | Status: DC | PRN

## 2015-01-24 MED ORDER — MIDAZOLAM HCL 2 MG/2ML IJ SOLN
INTRAMUSCULAR | Status: AC
Start: 2015-01-24 — End: 2015-01-24
  Filled 2015-01-24: qty 2

## 2015-01-24 MED ORDER — VERAPAMIL HCL 2.5 MG/ML IV SOLN
INTRAVENOUS | Status: DC
Start: 2015-01-24 — End: 2015-01-24
  Filled 2015-01-24: qty 2

## 2015-01-24 MED ORDER — SODIUM CHLORIDE 0.9 % IJ SOLN: 3.0000 mL | Freq: Two times a day (BID) | INTRAMUSCULAR | Status: DC

## 2015-01-24 MED ORDER — HEPARIN SODIUM (PORCINE) 1000 UNIT/ML IJ SOLN
INTRAMUSCULAR | Status: AC
  Filled 2015-01-24: qty 1

## 2015-01-24 NOTE — H&P (View-Only) (Signed)
HPI The patient presents for followup of coronary disease.  The patient was seen recently for evaluation of chest discomfort that was happening for about the past 6 months. It would happen with activity such as climbing stairs. I did send him for a stress perfusion study which did not suggest ischemia. However, he had global hypokinesis. Follow-up echocardiography demonstrated that his ejection fraction was now 35-40%.  I brought him back to discuss this. His daughter came with him. He's continuing to have chest discomfort such as when he climbs the stairs in his house. He's getting himself ready for bed he notices some discomfort in his chest and right arm. He might have this when he is chasing a grandchild.  He does not describe associated symptoms such as nausea vomiting or diaphoresis. He's not noticed any palpitations, presyncope or syncope. He's had no weight gain or edema.   No Known Allergies  Current Outpatient Prescriptions  Medication Sig Dispense Refill  . ALPRAZolam (XANAX) 0.5 MG tablet TAKE ONE TABLET BY MOUTH 4 TIMES DAILY AS NEEDED 120 tablet 5  . aspirin 81 MG tablet Take 81 mg by mouth daily.      . clopidogrel (PLAVIX) 75 MG tablet TAKE ONE TABLET BY MOUTH ONCE DAILY 90 tablet 1  . isosorbide mononitrate (IMDUR) 30 MG 24 hr tablet Take 1 tablet (30 mg total) by mouth daily. 90 tablet 3  . metFORMIN (GLUCOPHAGE) 1000 MG tablet TAKE ONE TABLET BY MOUTH TWICE DAILY WITH MEALS 60 tablet 11  . metoprolol (LOPRESSOR) 50 MG tablet TAKE ONE TABLET BY MOUTH TWICE DAILY 180 tablet 1  . nitroGLYCERIN (NITROSTAT) 0.4 MG SL tablet Place 1 tablet (0.4 mg total) under the tongue every 5 (five) minutes as needed for chest pain. 90 tablet 3  . simvastatin (ZOCOR) 40 MG tablet TAKE ONE TABLET BY MOUTH AT BEDTIME 90 tablet 3  . tamsulosin (FLOMAX) 0.4 MG CAPS capsule TAKE ONE CAPSULE BY MOUTH ONCE DAILY 30 capsule 6   No current facility-administered medications for this visit.    Past  Medical History  Diagnosis Date  . Anxiety   . Insomnia   . Hypertension   . Cerebrovascular accident     hx of 06 20 09  . GERD (gastroesophageal reflux disease)   . ED (erectile dysfunction)   . Depression     saw Dr. Casimiro Needle in the past  . CAD (coronary artery disease)     s/p cabg x 4  . Diabetes mellitus     Past Surgical History  Procedure Laterality Date  . Lumbar laminectomy  1995    per Dr. Gladstone Lighter  . Cervical laminectomy  1996    per Dr. Hal Neer  . Heart bypass  2001    Family History  Problem Relation Age of Onset  . Coronary artery disease    . Hypertension      History   Social History  . Marital Status: Divorced    Spouse Name: N/A    Number of Children: N/A  . Years of Education: N/A   Occupational History  . Not on file.   Social History Main Topics  . Smoking status: Current Every Day Smoker -- 1.00 packs/day for 50 years    Types: Cigarettes  . Smokeless tobacco: Never Used  . Alcohol Use: Yes     Comment: rare  . Drug Use: No  . Sexual Activity: Not on file   Other Topics Concern  . Not on file   Social  History Narrative   Disabled   Divorced, lives alone          ROS:  As stated in the HPI and negative for all other systems.  PHYSICAL EXAM BP 123/69 mmHg  Pulse 73  Ht 6' (1.829 m)  Wt 159 lb 8 oz (72.349 kg)  BMI 21.63 kg/m2 GENERAL:  Well appearing HEENT:  Pupils equal round and reactive, fundi not visualized, oral mucosa unremarkable NECK:  No jugular venous distention, waveform within normal limits, carotid upstroke brisk and symmetric, no bruits, no thyromegaly LYMPHATICS:  No cervical, inguinal adenopathy LUNGS:  Clear to auscultation bilaterally BACK:  No CVA tenderness CHEST:  Unremarkable HEART:  PMI not displaced or sustained,S1 and S2 within normal limits, no S3, no S4, no clicks, no rubs, no murmurs ABD:  Flat, positive bowel sounds normal in frequency in pitch, no bruits, no rebound, no guarding, no midline  pulsatile mass, no hepatomegaly, no splenomegaly EXT:  2 plus pulses throughout, no edema, no cyanosis no clubbing SKIN:  No rashes no nodules NEURO:  Cranial nerves II through XII grossly intact, motor grossly intact throughout PSYCH:  Cognitively intact, oriented to person place and time    ASSESSMENT AND PLAN  CAD:  He clearly is having exertional angina.  His stress test demonstrates a new cardiomyopathy. His EF was very slightly reduced in 2009 but is now 35-40%. Given this and his ongoing symptoms cardiac catheterization is indicated. I discussed at length risks benefits. The patient understands that risks included but are not limited to stroke (1 in 1000), death (1 in 50), kidney failure [usually temporary] (1 in 500), bleeding (1 in 200), allergic reaction [possibly serious] (1 in 200).  The patient understands and agrees to proceed.   DYSLIPIDEMIA:  His lipids are at target.  He will continue the meds as listed.   DM:  His blood sugar is at target.  He will continue on meds as listed.   TOBACCO ABUSE:   We had a long discussion about this.  He is not ready to quit.

## 2015-01-24 NOTE — CV Procedure (Addendum)
     Cardiac Catheterization Operative Report  Wayne Williams [id number] 1/27/20162:07 PM Laurey Morale, MD  Procedure Performed:  1. Left Heart Catheterization 2. Selective Coronary Angiography 3. SVG angiography 4. LIMA graft angiography 5. Left ventricular angiogram 6. Vascade closure device right femoral artery  Operator: Lauree Chandler, MD  Indication:  64 yo male with history of CAD s/p 5V CABG in 2001,  DM, HTN with recent chest pain with exertion worrisome for unstable angina.                                   Procedure Details: The risks, benefits, complications, treatment options, and expected outcomes were discussed with the patient. The patient and/or family concurred with the proposed plan, giving informed consent. The patient was brought to the cath lab after IV hydration was begun and oral premedication was given. The patient was further sedated with Versed and Fentanyl. The right groin was prepped and draped in the usual manner. Using the modified Seldinger access technique, a 5 French sheath was placed in the right femoral artery. Standard diagnostic catheters were used to perform selective coronary angiography. The JR4 catheter was used to engage all three vein grafts and the LIMA graft. A pigtail catheter was used to perform a left ventricular angiogram. Vascade closure device deployed right femoral artery.   There were no immediate complications. The patient was taken to the recovery area in stable condition.   Hemodynamic Findings: Central aortic pressure: 124/57 Left ventricular pressure: 122/4/17  Angiographic Findings:  Left main: 30% ostial stenosis.   Left Anterior Descending Artery: Large caliber vessel that courses to the apex. 60% proximal stenosis leading into a moderate caliber septal perforating branch. 100% mid occlusion. The mid and distal vessel fills from the patent IMA graft.   Circumflex Artery: Large caliber vessel with one large obtuse  marginal branch. The obtuse marginal branch is occluded at the ostium and fills from right to left collaterals. The mid Circumflex beyond the takeoff of the OM branch has a smooth 50% stenosis which does not appear to be flow limiting.   Right Coronary Artery: Large dominant vessel with mid 50% stenosis. There is diffuse mild plaque in the mid and distal vessel. The PDA is a small caliber vessel with diffuse 90% stenosis. The Posterolateral branch is a moderate caliber vessel with mild plaque.   Graft Anatomy:  SVG to OM is occluded SVG to Diagonal is occluded SVG sequential to distal RCA and PDA is occluded LIMA to mid LAD is patent  Left Ventricular Angiogram: LVEF=40% with global hypokinesis, apical akinesis.   Impression: 1. Triple vessel CAD s/p 5V CABG with 1/5 patent bypass grafts 2. Moderate non-obstructive disease in the mid RCA and mid Circumflex, neither of which appears to be flow limiting 3. Mild LV systolic dysfunction  Recommendations: Continued medical management of CAD.        Complications:  None. The patient tolerated the procedure well.

## 2015-01-24 NOTE — Discharge Instructions (Signed)

## 2015-01-24 NOTE — Interval H&P Note (Signed)
History and Physical Interval Note:  01/24/2015 1:12 PM  Wayne Williams  has presented today for cardiac cath with the diagnosis of CAD s/p CABG, recent chest pain c/w unstable angina.  The various methods of treatment have been discussed with the patient and family. After consideration of risks, benefits and other options for treatment, the patient has consented to  Procedure(s): LEFT HEART CATHETERIZATION WITH CORONARY/GRAFT ANGIOGRAM (N/A) as a surgical intervention .  The patient's history has been reviewed, patient examined, no change in status, stable for surgery.  I have reviewed the patient's chart and labs.  Questions were answered to the patient's satisfaction.    Cath Lab Visit (complete for each Cath Lab visit)  Clinical Evaluation Leading to the Procedure:   ACS: No.  Non-ACS:    Anginal Classification: CCS III  Anti-ischemic medical therapy: Maximal Therapy (2 or more classes of medications)  Non-Invasive Test Results: No non-invasive testing performed  Prior CABG: Previous CABG        Aurther Harlin

## 2015-02-14 ENCOUNTER — Encounter: Payer: Self-pay | Admitting: Cardiology

## 2015-02-14 ENCOUNTER — Ambulatory Visit (INDEPENDENT_AMBULATORY_CARE_PROVIDER_SITE_OTHER): Payer: Medicare Other | Admitting: Cardiology

## 2015-02-14 VITALS — BP 121/66 | HR 74 | Ht 72.0 in | Wt 157.0 lb

## 2015-02-14 DIAGNOSIS — I25708 Atherosclerosis of coronary artery bypass graft(s), unspecified, with other forms of angina pectoris: Secondary | ICD-10-CM | POA: Diagnosis not present

## 2015-02-14 MED ORDER — ISOSORBIDE MONONITRATE ER 120 MG PO TB24: 120.0000 mg | ORAL_TABLET | Freq: Every day | ORAL | Status: DC

## 2015-02-14 NOTE — Progress Notes (Signed)
HPI The patient presents for followup of coronary disease.  He was having chest discomfort and his ejection fraction had fallen to 40%. I sent him for cardiac catheterization demonstrated an occluded LAD, occluded OM, high-grade diffuse disease in the right coronary artery. Sequential saphenous vein graft to the right coronary artery and PDA was occluded, vein graft to the OM was occluded, vein graft to a diagonal was occluded. LIMA to the LAD was patent. The EF was 40%. He returns to discuss this. He still getting some chest discomfort activities. He is not however having any resting chest discomfort, neck or arm discomfort. He's having no new palpitations, presyncope or syncope. He is having no PND or orthopnea. He has no weight gain or edema.   No Known Allergies  Current Outpatient Prescriptions  Medication Sig Dispense Refill  . aspirin 81 MG tablet Take 81 mg by mouth daily.      . clopidogrel (PLAVIX) 75 MG tablet TAKE ONE TABLET BY MOUTH ONCE DAILY 90 tablet 1  . isosorbide mononitrate (IMDUR) 30 MG 24 hr tablet Take 1 tablet (30 mg total) by mouth daily. 90 tablet 3  . metoprolol (LOPRESSOR) 50 MG tablet TAKE ONE TABLET BY MOUTH TWICE DAILY 180 tablet 1  . nitroGLYCERIN (NITROSTAT) 0.4 MG SL tablet Place 1 tablet (0.4 mg total) under the tongue every 5 (five) minutes as needed for chest pain. 90 tablet 3  . simvastatin (ZOCOR) 40 MG tablet TAKE ONE TABLET BY MOUTH AT BEDTIME 90 tablet 3  . tamsulosin (FLOMAX) 0.4 MG CAPS capsule TAKE ONE CAPSULE BY MOUTH ONCE DAILY 30 capsule 6   No current facility-administered medications for this visit.    Past Medical History  Diagnosis Date  . Anxiety   . Insomnia   . Hypertension   . Cerebrovascular accident     hx of 06 20 09  . GERD (gastroesophageal reflux disease)   . ED (erectile dysfunction)   . Depression     saw Dr. Casimiro Needle in the past  . CAD (coronary artery disease)     s/p cabg x 4  . Diabetes mellitus     Past Surgical  History  Procedure Laterality Date  . Lumbar laminectomy  1995    per Dr. Gladstone Lighter  . Cervical laminectomy  1996    per Dr. Hal Neer  . Heart bypass  2001  . Left heart catheterization with coronary/graft angiogram N/A 01/24/2015    Procedure: LEFT HEART CATHETERIZATION WITH Beatrix Fetters;  Surgeon: Burnell Blanks, MD;  Location: [facility] CATH LAB;  Service: Cardiovascular;  Laterality: N/A;      ROS:  As stated in the HPI and negative for all other systems.  PHYSICAL EXAM BP 121/66 mmHg  Pulse 74  Ht 6' (1.829 m)  Wt 157 lb (71.215 kg)  BMI 21.29 kg/m2 GENERAL:  Well appearing HEENT:  Pupils equal round and reactive, fundi not visualized, oral mucosa unremarkable NECK:  No jugular venous distention, waveform within normal limits, carotid upstroke brisk and symmetric, no bruits, no thyromegaly LYMPHATICS:  No cervical, inguinal adenopathy LUNGS:  Clear to auscultation bilaterally BACK:  No CVA tenderness CHEST:  Unremarkable HEART:  PMI not displaced or sustained,S1 and S2 within normal limits, no S3, no S4, no clicks, no rubs, no murmurs ABD:  Flat, positive bowel sounds normal in frequency in pitch, no bruits, no rebound, no guarding, no midline pulsatile mass, no hepatomegaly, no splenomegaly EXT:  2 plus pulses throughout, no edema, no cyanosis no  clubbing SKIN:  No rashes no nodules NEURO:  Cranial nerves II through XII grossly intact, motor grossly intact throughout PSYCH:  Cognitively intact, oriented to person place and time      ASSESSMENT AND PLAN  CAD:  He has severe disease as described. This needs to be managed medically. Revascularization is not an option. I will increase his Imdur to 120 mg daily. We will continue with aggressive risk reduction.  DYSLIPIDEMIA:  His lipids are at target.  He will continue the meds as listed.   DM:  His blood sugar is at target.  He will continue on meds as listed.   TOBACCO ABUSE:   We had a long discussion  about this again.  He will work on at least cutting down one cigarette at a time.

## 2015-02-14 NOTE — Patient Instructions (Signed)
Your physician recommends that you schedule a follow-up appointment in: 2 months with Dr. Percival Spanish  We have increased your imdur to 120 mg

## 2015-04-17 ENCOUNTER — Ambulatory Visit: Payer: Medicare Other | Admitting: Cardiology

## 2015-04-20 ENCOUNTER — Other Ambulatory Visit: Payer: Self-pay | Admitting: Family Medicine

## 2015-04-24 ENCOUNTER — Ambulatory Visit (INDEPENDENT_AMBULATORY_CARE_PROVIDER_SITE_OTHER): Payer: Medicare Other | Admitting: Family Medicine

## 2015-04-24 ENCOUNTER — Encounter: Payer: Self-pay | Admitting: Family Medicine

## 2015-04-24 ENCOUNTER — Ambulatory Visit (INDEPENDENT_AMBULATORY_CARE_PROVIDER_SITE_OTHER)
Admission: RE | Admit: 2015-04-24 | Discharge: 2015-04-24 | Disposition: A | Discharge status: Home / Self Care | Payer: Medicare Other | Source: Ambulatory Visit | Attending: Family Medicine | Admitting: Family Medicine

## 2015-04-24 VITALS — BP 114/62 | HR 78 | Temp 98.0°F

## 2015-04-24 DIAGNOSIS — J181 Lobar pneumonia, unspecified organism: Principal | ICD-10-CM

## 2015-04-24 DIAGNOSIS — R079 Chest pain, unspecified: Secondary | ICD-10-CM | POA: Diagnosis not present

## 2015-04-24 DIAGNOSIS — J449 Chronic obstructive pulmonary disease, unspecified: Secondary | ICD-10-CM | POA: Diagnosis not present

## 2015-04-24 DIAGNOSIS — R509 Fever, unspecified: Secondary | ICD-10-CM | POA: Diagnosis not present

## 2015-04-24 DIAGNOSIS — J189 Pneumonia, unspecified organism: Secondary | ICD-10-CM | POA: Diagnosis not present

## 2015-04-24 DIAGNOSIS — R05 Cough: Secondary | ICD-10-CM | POA: Diagnosis not present

## 2015-04-24 MED ORDER — HYDROCOD POLST-CPM POLST ER 10-8 MG/5ML PO SUER: 5.0000 mL | Freq: Two times a day (BID) | ORAL | Status: DC | PRN

## 2015-04-24 MED ORDER — TRIAMCINOLONE ACETONIDE 0.1 % EX CREA: 1.0000 "application " | TOPICAL_CREAM | Freq: Two times a day (BID) | CUTANEOUS | Status: DC

## 2015-04-24 MED ORDER — AMOXICILLIN-POT CLAVULANATE 875-125 MG PO TABS: 1.0000 | ORAL_TABLET | Freq: Two times a day (BID) | ORAL | Status: DC

## 2015-04-24 MED ORDER — CEFTRIAXONE SODIUM 1 G IJ SOLR
1.0000 g | Freq: Once | INTRAMUSCULAR | Status: AC
  Administered 2015-04-24: 1 g via INTRAMUSCULAR

## 2015-04-24 NOTE — Addendum Note (Signed)
Addended by: Aggie Hacker A on: 04/24/2015 04:01 PM   Modules accepted: Orders

## 2015-04-24 NOTE — Progress Notes (Signed)
   Subjective:    Patient ID: Wayne Williams, male    DOB: May 10, 1951, 64 y.o.   MRN: [id number]  HPI Here for 8 days of a deep cough which produces yellow sputum, SOB, and shaking chills. No chest pain. He is weak and has very little appetite.    Review of Systems  Constitutional: Positive for fever, chills, appetite change and fatigue. Negative for diaphoresis.  HENT: Negative.   Eyes: Negative.   Respiratory: Positive for cough, chest tightness and shortness of breath. Negative for wheezing.   Cardiovascular: Negative.        Objective:   Physical Exam  Constitutional:  Appears ill, thin   HENT:  Right Ear: External ear normal.  Left Ear: External ear normal.  Nose: Nose normal.  Mouth/Throat: Oropharynx is clear and moist.  Eyes: Conjunctivae are normal.  Cardiovascular: Normal rate, regular rhythm, normal heart sounds and intact distal pulses.   Pulmonary/Chest: Effort normal. No respiratory distress.  Soft wheezes, rales are present at the left base   Lymphadenopathy:    He has no cervical adenopathy.          Assessment & Plan:  He has pneumonia. Given a shot of Rocephin to be followed by a course of Augmentin. Get a CXR today

## 2015-04-24 NOTE — Progress Notes (Signed)
Pre visit review using our clinic review tool, if applicable. No additional management support is needed unless otherwise documented below in the visit note. Pt unable to weigh 

## 2015-05-03 ENCOUNTER — Other Ambulatory Visit: Payer: Self-pay | Admitting: Family Medicine

## 2015-05-07 NOTE — Telephone Encounter (Signed)
Call in #[phone] with 5 rf 

## 2015-07-03 ENCOUNTER — Other Ambulatory Visit: Payer: Self-pay | Admitting: Family Medicine

## 2015-08-20 ENCOUNTER — Ambulatory Visit: Payer: Medicare Other | Admitting: Family Medicine

## 2015-08-21 ENCOUNTER — Other Ambulatory Visit: Payer: Self-pay | Admitting: Family Medicine

## 2015-08-31 ENCOUNTER — Other Ambulatory Visit: Payer: Self-pay | Admitting: Family Medicine

## 2015-09-23 ENCOUNTER — Other Ambulatory Visit: Payer: Self-pay | Admitting: Family Medicine

## 2015-09-28 ENCOUNTER — Ambulatory Visit (INDEPENDENT_AMBULATORY_CARE_PROVIDER_SITE_OTHER): Payer: Medicare Other | Admitting: Family Medicine

## 2015-09-28 ENCOUNTER — Encounter: Payer: Self-pay | Admitting: Family Medicine

## 2015-09-28 VITALS — BP 121/69 | HR 75 | Temp 98.5°F

## 2015-09-28 DIAGNOSIS — J209 Acute bronchitis, unspecified: Secondary | ICD-10-CM | POA: Diagnosis not present

## 2015-09-28 MED ORDER — HYDROCOD POLST-CPM POLST ER 10-8 MG/5ML PO SUER: 5.0000 mL | Freq: Two times a day (BID) | ORAL | Status: DC | PRN

## 2015-09-28 MED ORDER — AMOXICILLIN-POT CLAVULANATE 875-125 MG PO TABS: 1.0000 | ORAL_TABLET | Freq: Two times a day (BID) | ORAL | Status: DC

## 2015-09-28 NOTE — Progress Notes (Signed)
   Subjective:    Patient ID: Wayne Williams, male    DOB: [birth year], 64 y.o.   MRN: [id number]  HPI Here for 10 days of chest congestion and coughing up yellow sputum. No fever or chest pain. He gets SOB at times. Drinking fluids.    Review of Systems  Constitutional: Negative.   HENT: Positive for congestion. Negative for postnasal drip and sinus pressure.   Eyes: Negative.   Respiratory: Positive for cough, chest tightness, shortness of breath and wheezing.   Cardiovascular: Negative.        Objective:   Physical Exam  Constitutional:  Frail, weak   HENT:  Right Ear: External ear normal.  Left Ear: External ear normal.  Nose: Nose normal.  Mouth/Throat: Oropharynx is clear and moist.  Eyes: Conjunctivae are normal.  Neck: No thyromegaly present.  Cardiovascular: Normal rate, regular rhythm, normal heart sounds and intact distal pulses.   Pulmonary/Chest: Effort normal. No respiratory distress. He has no rales.  Scattered ronchi and wheezes   Lymphadenopathy:    He has no cervical adenopathy.          Assessment & Plan:  Bronchitis, treat with Augmentin

## 2015-09-28 NOTE — Progress Notes (Signed)
Pre visit review using our clinic review tool, if applicable. No additional management support is needed unless otherwise documented below in the visit note. Pt unable to weigh 

## 2015-10-01 ENCOUNTER — Other Ambulatory Visit: Payer: Self-pay | Admitting: Family Medicine

## 2015-10-16 ENCOUNTER — Other Ambulatory Visit: Payer: Self-pay | Admitting: Family Medicine

## 2015-10-18 ENCOUNTER — Other Ambulatory Visit: Payer: Self-pay | Admitting: Family Medicine

## 2015-10-18 MED ORDER — CLOPIDOGREL BISULFATE 75 MG PO TABS
75.0000 mg | ORAL_TABLET | Freq: Every day | ORAL | Status: DC
Start: 2015-10-18 — End: 2016-01-22

## 2015-10-18 NOTE — Addendum Note (Signed)
Addended by: Colleen Can on: 10/18/2015 03:58 PM   Modules accepted: Orders

## 2015-11-16 ENCOUNTER — Telehealth: Payer: Self-pay

## 2015-11-16 NOTE — Telephone Encounter (Signed)
Pt due for a follow up appointment and urine microalbumin.  Called to schedule an appt.  No answer.  Left a message for call back.

## 2015-11-19 NOTE — Telephone Encounter (Signed)
Appt scheduled

## 2015-11-23 ENCOUNTER — Other Ambulatory Visit: Payer: Self-pay | Admitting: Family Medicine

## 2015-11-27 ENCOUNTER — Encounter: Payer: Self-pay | Admitting: Family Medicine

## 2015-11-27 ENCOUNTER — Ambulatory Visit (INDEPENDENT_AMBULATORY_CARE_PROVIDER_SITE_OTHER): Payer: Medicare Other | Admitting: Family Medicine

## 2015-11-27 VITALS — BP 124/78 | HR 81 | Temp 97.9°F | Ht 72.0 in | Wt 150.9 lb

## 2015-11-27 DIAGNOSIS — I10 Essential (primary) hypertension: Secondary | ICD-10-CM

## 2015-11-27 DIAGNOSIS — N138 Other obstructive and reflux uropathy: Secondary | ICD-10-CM

## 2015-11-27 DIAGNOSIS — E119 Type 2 diabetes mellitus without complications: Secondary | ICD-10-CM | POA: Diagnosis not present

## 2015-11-27 DIAGNOSIS — E785 Hyperlipidemia, unspecified: Secondary | ICD-10-CM | POA: Diagnosis not present

## 2015-11-27 DIAGNOSIS — N401 Enlarged prostate with lower urinary tract symptoms: Secondary | ICD-10-CM

## 2015-11-27 LAB — BASIC METABOLIC PANEL
BUN: 16 mg/dL (ref 6–23)
CO2: 31 mEq/L (ref 19–32)
Calcium: 9.6 mg/dL (ref 8.4–10.5)
Chloride: 103 mEq/L (ref 96–112)
Creatinine, Ser: 1.11 mg/dL (ref 0.40–1.50)
GFR: 70.28 mL/min (ref >60.00)
Glucose, Bld: 77 mg/dL (ref 70–99)
Potassium: 5 mEq/L (ref 3.5–5.1)
Sodium: 141 mEq/L (ref 135–145)

## 2015-11-27 LAB — HEPATIC FUNCTION PANEL
ALT: 11 U/L (ref 0–53)
AST: 15 U/L (ref 0–37)
Albumin: 4.2 g/dL (ref 3.5–5.2)
Alkaline Phosphatase: 60 U/L (ref 39–117)
Bilirubin, Direct: 0.1 mg/dL (ref 0.0–0.3)
Total Bilirubin: 0.6 mg/dL (ref 0.2–1.2)
Total Protein: 6.8 g/dL (ref 6.0–8.3)

## 2015-11-27 LAB — POCT URINALYSIS DIPSTICK
Bilirubin, UA: NEGATIVE
Glucose, UA: NEGATIVE
Ketones, UA: NEGATIVE
Leukocytes, UA: NEGATIVE
Nitrite, UA: NEGATIVE
Spec Grav, UA: 1.01
Urobilinogen, UA: 0.2
pH, UA: 6

## 2015-11-27 LAB — MICROALBUMIN / CREATININE URINE RATIO
Creatinine,U: 42.4 mg/dL
Microalb Creat Ratio: 29.5 mg/g (ref 0.0–30.0)
Microalb, Ur: 12.5 mg/dL — ABNORMAL HIGH (ref 0.0–1.9)

## 2015-11-27 LAB — CBC WITH DIFFERENTIAL/PLATELET
Basophils Absolute: 0.1 10*3/uL (ref 0.0–0.1)
Basophils Relative: 0.6 % (ref 0.0–3.0)
Eosinophils Absolute: 0.5 10*3/uL (ref 0.0–0.7)
Eosinophils Relative: 5.3 % — ABNORMAL HIGH (ref 0.0–5.0)
HCT: 40 % (ref 39.0–52.0)
Hemoglobin: 13.1 g/dL (ref 13.0–17.0)
Lymphocytes Relative: 28.7 % (ref 12.0–46.0)
Lymphs Abs: 2.9 10*3/uL (ref 0.7–4.0)
MCHC: 32.7 g/dL (ref 30.0–36.0)
MCV: 95 fl (ref 78.0–100.0)
Monocytes Absolute: 0.9 10*3/uL (ref 0.1–1.0)
Monocytes Relative: 9.1 % (ref 3.0–12.0)
Neutro Abs: 5.8 10*3/uL (ref 1.4–7.7)
Neutrophils Relative %: 56.3 % (ref 43.0–77.0)
Platelets: 260 10*3/uL (ref 150.0–400.0)
RBC: 4.21 Mil/uL — ABNORMAL LOW (ref 4.22–5.81)
RDW: 14.2 % (ref 11.5–15.5)
WBC: 10.3 10*3/uL (ref 4.0–10.5)

## 2015-11-27 LAB — LIPID PANEL
Cholesterol: 113 mg/dL (ref 0–200)
HDL: 33.8 mg/dL — ABNORMAL LOW (ref >39.00)
LDL Cholesterol: 55 mg/dL (ref 0–99)
NonHDL: 78.97
Total CHOL/HDL Ratio: 3
Triglycerides: 122 mg/dL (ref 0.0–149.0)
VLDL: 24.4 mg/dL (ref 0.0–40.0)

## 2015-11-27 LAB — HEMOGLOBIN A1C: Hgb A1c MFr Bld: 5.8 % (ref 4.6–6.5)

## 2015-11-27 LAB — TSH: TSH: 1.46 u[IU]/mL (ref 0.35–4.50)

## 2015-11-27 LAB — PSA: PSA: 1.14 ng/mL (ref 0.10–4.00)

## 2015-11-27 MED ORDER — HYDROCODONE-HOMATROPINE 5-1.5 MG/5ML PO SYRP: 5.0000 mL | ORAL_SOLUTION | ORAL | Status: DC | PRN

## 2015-11-27 NOTE — Progress Notes (Signed)
   Subjective:    Patient ID: Wayne Williams, male    DOB: [birth year], 64 y.o.   MRN: [id number]  HPI Here to follow up on HTN , lipids, and diabetes. He has felt well. Is BP is stable in the 120s over 70s at home, and his fasting glucoses are int the range of 75 to 125.   Review of Systems  Constitutional: Negative.   Respiratory: Negative.   Cardiovascular: Negative.   Neurological: Negative.        Objective:   Physical Exam  Constitutional: He is oriented to person, place, and time. He appears well-developed and well-nourished.  Neck: No thyromegaly present.  Cardiovascular: Normal rate, regular rhythm, normal heart sounds and intact distal pulses.   Pulmonary/Chest: Effort normal and breath sounds normal.  Musculoskeletal: He exhibits no edema.  Lymphadenopathy:    He has no cervical adenopathy.  Neurological: He is alert and oriented to person, place, and time.          Assessment & Plan:  He seems to be doing well. We will get fasting labs today to check on A1c, lipids, etc.

## 2015-11-27 NOTE — Telephone Encounter (Signed)
Call in #[phone] with 5 rf 

## 2015-11-29 ENCOUNTER — Other Ambulatory Visit: Payer: Self-pay | Admitting: *Deleted

## 2015-11-29 MED ORDER — LISINOPRIL 10 MG PO TABS: 10.0000 mg | ORAL_TABLET | Freq: Every day | ORAL | Status: DC

## 2015-12-19 ENCOUNTER — Other Ambulatory Visit: Payer: Self-pay | Admitting: Family Medicine

## 2015-12-28 ENCOUNTER — Ambulatory Visit (INDEPENDENT_AMBULATORY_CARE_PROVIDER_SITE_OTHER): Payer: Medicare Other | Admitting: Family Medicine

## 2015-12-28 ENCOUNTER — Encounter: Payer: Self-pay | Admitting: Family Medicine

## 2015-12-28 VITALS — BP 122/74 | HR 100 | Temp 99.9°F | Ht 72.0 in | Wt 148.0 lb

## 2015-12-28 DIAGNOSIS — J209 Acute bronchitis, unspecified: Secondary | ICD-10-CM | POA: Diagnosis not present

## 2015-12-28 NOTE — Progress Notes (Signed)
Pre visit review using our clinic review tool, if applicable. No additional management support is needed unless otherwise documented below in the visit note. 

## 2015-12-28 NOTE — Progress Notes (Signed)
   Subjective:    Patient ID: Wayne Williams, male    DOB: Oct 16, 1951, 64 y.o.   MRN: [id number]  HPI Here for 5 days of chest tightness and coughing up yellow sputum. He has had fevers off and on.    Review of Systems  Constitutional: Positive for fever.  HENT: Positive for congestion. Negative for postnasal drip, sinus pressure and sore throat.   Eyes: Negative.   Respiratory: Positive for cough.        Objective:   Physical Exam  Constitutional: He appears well-developed and well-nourished.  HENT:  Right Ear: External ear normal.  Left Ear: External ear normal.  Nose: Nose normal.  Mouth/Throat: Oropharynx is clear and moist.  Eyes: Conjunctivae are normal.  Neck: No thyromegaly present.  Pulmonary/Chest: Effort normal. No respiratory distress. He has no wheezes. He has no rales.  Scattered rhonchi   Lymphadenopathy:    He has no cervical adenopathy.          Assessment & Plan:  Bronchitis, treat with Augmentin. He states he already has a 10 day supply at home.

## 2016-01-02 ENCOUNTER — Telehealth: Payer: Self-pay | Admitting: Family Medicine

## 2016-01-02 MED ORDER — HYDROCODONE-HOMATROPINE 5-1.5 MG/5ML PO SYRP: 5.0000 mL | ORAL_SOLUTION | ORAL | Status: DC | PRN

## 2016-01-02 NOTE — Telephone Encounter (Signed)
Please advise 

## 2016-01-02 NOTE — Telephone Encounter (Signed)
I did refill the cough syrup. He can follow up prn

## 2016-01-02 NOTE — Telephone Encounter (Signed)
Rx is ready for pick up and pt is aware  
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
## 2016-01-02 NOTE — Telephone Encounter (Signed)
Patient Name: Wayne Williams  DOB: November 04, 1951    Initial Comment Caller states her Dad was seen on Thursday- Bronchitis. Still coughing really hard.   Nurse Assessment  Nurse: Mallie Mussel, RN, Alveta Heimlich Date/Time Eilene Ghazi Time): 01/02/2016 10:49:13 AM  Confirm and document reason for call. If symptomatic, describe symptoms. ---Caller is not with her father at this time. She just got off the phone with him and he had to hang up due to problems catching his breath. She states that he has had problems with this off and on for about 6 months now. She feels he is getting worse. She is wondering if he has been on the same antibiotic each time and not responding to it now, does he need to be seen again or maybe need to go to ER. I called the backline and provided information to Lyle. She states to advise her that someone will call her back here in a few minutes once they receive this report. Caller verbalized understanding.  Has the patient traveled out of the country within the last 30 days? ---Not Applicable  Does the patient have any new or worsening symptoms? ---Yes  Will a triage be completed? ---No  Select reason for no triage. ---Other     Guidelines    Guideline Title Affirmed Question Affirmed Notes       Final Disposition User   Clinical Call

## 2016-01-21 ENCOUNTER — Telehealth: Payer: Self-pay | Admitting: Family Medicine

## 2016-01-21 NOTE — Telephone Encounter (Signed)
Pt received automated call for PNA vaccine.  Pt states he will discuss with Dr Sarajane Jews on his next visit.

## 2016-01-21 NOTE — Telephone Encounter (Signed)
Okay 

## 2016-01-22 ENCOUNTER — Other Ambulatory Visit: Payer: Self-pay | Admitting: Family Medicine

## 2016-01-22 NOTE — Telephone Encounter (Signed)
Can we refill this? 

## 2016-02-19 ENCOUNTER — Telehealth: Payer: Self-pay | Admitting: Family Medicine

## 2016-02-19 MED ORDER — OSELTAMIVIR PHOSPHATE 75 MG PO CAPS: 75.0000 mg | ORAL_CAPSULE | Freq: Two times a day (BID) | ORAL | Status: DC

## 2016-02-19 NOTE — Telephone Encounter (Signed)
Pt said he took his grandson to [place] and he has the flu. Was told by them that he should take some preventive medication. Pt call to ask for this.     Pharmacy [facility] [place]

## 2016-02-19 NOTE — Telephone Encounter (Signed)
Call in Tamiflu 75 mg bid for 5 days  

## 2016-02-19 NOTE — Telephone Encounter (Signed)
I spoke with pt and went over below information. 

## 2016-02-19 NOTE — Telephone Encounter (Signed)
Patient stated the medication Tamiflu was to expensive, is there another alternative. Please advise

## 2016-02-19 NOTE — Telephone Encounter (Signed)
Per Dr. Sarajane Jews there is no alternative.

## 2016-02-19 NOTE — Telephone Encounter (Signed)
I sent script e-scribe and spoke with pt. 

## 2016-03-29 ENCOUNTER — Other Ambulatory Visit: Payer: Self-pay | Admitting: Family Medicine

## 2016-03-31 ENCOUNTER — Other Ambulatory Visit: Payer: Self-pay | Admitting: Family Medicine

## 2016-03-31 NOTE — Telephone Encounter (Signed)
Refill request for Metformin, however it had already been sent earlier today to pharmacy.

## 2016-04-06 ENCOUNTER — Other Ambulatory Visit: Payer: Self-pay | Admitting: Family Medicine

## 2016-04-30 ENCOUNTER — Other Ambulatory Visit: Payer: Self-pay | Admitting: Family Medicine

## 2016-05-30 ENCOUNTER — Other Ambulatory Visit: Payer: Self-pay | Admitting: Adult Health

## 2016-05-30 NOTE — Telephone Encounter (Signed)
Dr. Fry patient. 

## 2016-06-11 ENCOUNTER — Other Ambulatory Visit: Payer: Self-pay | Admitting: Family Medicine

## 2016-06-11 NOTE — Telephone Encounter (Signed)
Call in #[phone] with 5 rf 

## 2016-06-16 ENCOUNTER — Other Ambulatory Visit: Payer: Self-pay | Admitting: Family Medicine

## 2016-06-18 ENCOUNTER — Other Ambulatory Visit: Payer: Self-pay | Admitting: Family Medicine

## 2016-06-30 ENCOUNTER — Other Ambulatory Visit: Payer: Self-pay | Admitting: Family Medicine

## 2016-07-04 ENCOUNTER — Other Ambulatory Visit: Payer: Self-pay | Admitting: *Deleted

## 2016-07-04 MED ORDER — LISINOPRIL 10 MG PO TABS: 10.0000 mg | ORAL_TABLET | Freq: Every day | ORAL | Status: DC

## 2016-07-04 MED ORDER — CLOPIDOGREL BISULFATE 75 MG PO TABS: 75.0000 mg | ORAL_TABLET | Freq: Every day | ORAL | Status: DC

## 2016-07-04 MED ORDER — METFORMIN HCL 1000 MG PO TABS: ORAL_TABLET | ORAL | Status: DC

## 2016-07-04 MED ORDER — METOPROLOL TARTRATE 50 MG PO TABS: 50.0000 mg | ORAL_TABLET | Freq: Two times a day (BID) | ORAL | Status: DC

## 2016-07-04 MED ORDER — SIMVASTATIN 40 MG PO TABS: 40.0000 mg | ORAL_TABLET | Freq: Every day | ORAL | Status: DC

## 2016-07-04 MED ORDER — TAMSULOSIN HCL 0.4 MG PO CAPS: 0.4000 mg | ORAL_CAPSULE | Freq: Every day | ORAL | Status: DC

## 2016-07-04 NOTE — Telephone Encounter (Signed)
Rx done. 

## 2016-07-22 ENCOUNTER — Ambulatory Visit (INDEPENDENT_AMBULATORY_CARE_PROVIDER_SITE_OTHER): Payer: Medicare Other | Admitting: Family Medicine

## 2016-07-22 VITALS — BP 120/65 | HR 76 | Temp 98.5°F | Ht 72.0 in | Wt 141.0 lb

## 2016-07-22 DIAGNOSIS — E119 Type 2 diabetes mellitus without complications: Secondary | ICD-10-CM | POA: Diagnosis not present

## 2016-07-22 DIAGNOSIS — E785 Hyperlipidemia, unspecified: Secondary | ICD-10-CM

## 2016-07-22 DIAGNOSIS — I10 Essential (primary) hypertension: Secondary | ICD-10-CM

## 2016-07-22 DIAGNOSIS — I6350 Cerebral infarction due to unspecified occlusion or stenosis of unspecified cerebral artery: Secondary | ICD-10-CM | POA: Diagnosis not present

## 2016-07-22 LAB — HEMOGLOBIN A1C: Hgb A1c MFr Bld: 6 % (ref 4.6–6.5)

## 2016-07-22 MED ORDER — METOPROLOL TARTRATE 50 MG PO TABS: 50.0000 mg | ORAL_TABLET | Freq: Two times a day (BID) | ORAL | 3 refills | Status: DC

## 2016-07-22 MED ORDER — CLOPIDOGREL BISULFATE 75 MG PO TABS: 75.0000 mg | ORAL_TABLET | Freq: Every day | ORAL | 3 refills | Status: DC

## 2016-07-22 MED ORDER — SIMVASTATIN 40 MG PO TABS: 40.0000 mg | ORAL_TABLET | Freq: Every day | ORAL | 3 refills | Status: DC

## 2016-07-22 MED ORDER — METFORMIN HCL 1000 MG PO TABS: ORAL_TABLET | ORAL | 3 refills | Status: DC

## 2016-07-22 MED ORDER — LISINOPRIL 10 MG PO TABS: 10.0000 mg | ORAL_TABLET | Freq: Every day | ORAL | 3 refills | Status: DC

## 2016-07-22 MED ORDER — NITROGLYCERIN 0.4 MG SL SUBL: 0.4000 mg | SUBLINGUAL_TABLET | SUBLINGUAL | 3 refills | Status: DC | PRN

## 2016-07-22 MED ORDER — TAMSULOSIN HCL 0.4 MG PO CAPS: 0.4000 mg | ORAL_CAPSULE | Freq: Every day | ORAL | 3 refills | Status: DC

## 2016-07-22 NOTE — Progress Notes (Signed)
   Subjective:    Patient ID: MARKAS BAUMEL, male    DOB: 02-23-1951, 65 y.o.   MRN: [id number]  HPI Here to follow up. He feels well other than easy fatigue. He has lost 7 more lbs since December, but he admits to eating very little food. His appetite remains low.    Review of Systems  Constitutional: Positive for fatigue.  Respiratory: Negative.   Cardiovascular: Negative.   Neurological: Negative.   Psychiatric/Behavioral: Negative.        Objective:   Physical Exam  Constitutional: He is oriented to person, place, and time.  Thin, alert   Neck: No thyromegaly present.  Cardiovascular: Normal rate, regular rhythm, normal heart sounds and intact distal pulses.   Pulmonary/Chest: Effort normal and breath sounds normal.  Lymphadenopathy:    He has no cervical adenopathy.  Neurological: He is alert and oriented to person, place, and time.  Psychiatric: He has a normal mood and affect. His behavior is normal. Thought content normal.          Assessment & Plan:  He seems to be doing well. His heart disease and HTN are stable. His depression is stable. We will check the diabetes today with an A1c. I encouraged him to drinlk 2-3 cans of Ensure a day to supplement his diet.  Laurey Morale, MD

## 2016-07-22 NOTE — Progress Notes (Signed)
Pre visit review using our clinic review tool, if applicable. No additional management support is needed unless otherwise documented below in the visit note. 

## 2016-07-28 ENCOUNTER — Telehealth: Payer: Self-pay | Admitting: Family Medicine

## 2016-07-28 MED ORDER — HYDROCODONE-HOMATROPINE 5-1.5 MG/5ML PO SYRP: 5.0000 mL | ORAL_SOLUTION | ORAL | 0 refills | Status: DC | PRN

## 2016-07-28 NOTE — Telephone Encounter (Signed)
Script is ready for pick up here at front office and I spoke with pt.  

## 2016-07-28 NOTE — Telephone Encounter (Signed)
Pt was seen on 07/22/16. Pt has a cough and would like hydro met cough med

## 2016-07-28 NOTE — Telephone Encounter (Signed)
done

## 2016-12-16 ENCOUNTER — Telehealth: Payer: Self-pay | Admitting: Emergency Medicine

## 2016-12-16 NOTE — Telephone Encounter (Signed)
Pt requesting refill Alprazolam 0.5  Last filled 06/12/16 #120 refills 5   Last OV 07/22/16  Okay to refill?

## 2016-12-17 NOTE — Telephone Encounter (Signed)
Call in #[phone] with 5 rf 

## 2016-12-17 NOTE — Telephone Encounter (Signed)
Pt following up on request refill  ALPRAZolam (XANAX) 0.5 MG tablet   [facility]

## 2016-12-18 MED ORDER — ALPRAZOLAM 0.5 MG PO TABS: ORAL_TABLET | ORAL | 5 refills | Status: DC

## 2016-12-18 NOTE — Addendum Note (Signed)
Addended by: Aggie Hacker A on: 12/18/2016 11:26 AM   Modules accepted: Orders

## 2016-12-18 NOTE — Telephone Encounter (Signed)
I called in script to [facility] and spoke with pt.  

## 2017-01-12 ENCOUNTER — Telehealth: Payer: Self-pay | Admitting: Family Medicine

## 2017-01-12 NOTE — Telephone Encounter (Signed)
[facility]  Patient Name: Wayne Williams  DOB: 1951-01-03    Initial Comment Caller states he has the flu, cough, runny nose. Doesn't know about running a fever.    Nurse Assessment  Nurse: Harlow Mares, RN, Suanne Marker Date/Time (Eastern Time): 01/12/2017 5:44:30 PM  Confirm and document reason for call. If symptomatic, describe symptoms. ---Caller states he has the flu, chest congestion with cough, runny nose. Doesn't know about running a fever. Reports feeling feverish, no body aches, but has weakness and lightheadedness. Cough for several days, worsening.  Does the patient have any new or worsening symptoms? ---Yes  Will a triage be completed? ---Yes  Related visit to physician within the last 2 weeks? ---No  Does the PT have any chronic conditions? (i.e. diabetes, asthma, etc.) ---Yes  List chronic conditions. ---hx bronchitis; diabetes; HTN  Is this a behavioral health or substance abuse call? ---No     Guidelines    Guideline Title Affirmed Question Affirmed Notes  Cough - Acute Productive Wheezing is present    Final Disposition User   See Physician within 4 Hours (or PCP triage) Harlow Mares, RN, [place] New location MD appt with Dr. Sarajane Jews made for tomorrow 01/13/17 @ 11am.   Referrals  REFERRED TO PCP OFFICE   Disagree/Comply: Disagree  Disagree/Comply Reason: Wait and see

## 2017-01-13 ENCOUNTER — Encounter: Payer: Self-pay | Admitting: Family Medicine

## 2017-01-13 ENCOUNTER — Ambulatory Visit (INDEPENDENT_AMBULATORY_CARE_PROVIDER_SITE_OTHER)
Admission: RE | Admit: 2017-01-13 | Discharge: 2017-01-13 | Disposition: A | Discharge status: Home / Self Care | Payer: Medicare Other | Source: Ambulatory Visit | Attending: Family Medicine | Admitting: Family Medicine

## 2017-01-13 ENCOUNTER — Ambulatory Visit (INDEPENDENT_AMBULATORY_CARE_PROVIDER_SITE_OTHER): Payer: Medicare Other | Admitting: Family Medicine

## 2017-01-13 VITALS — BP 112/81 | HR 103 | Temp 97.7°F | Ht 72.0 in | Wt 140.0 lb

## 2017-01-13 DIAGNOSIS — J181 Lobar pneumonia, unspecified organism: Secondary | ICD-10-CM | POA: Diagnosis not present

## 2017-01-13 DIAGNOSIS — J189 Pneumonia, unspecified organism: Secondary | ICD-10-CM

## 2017-01-13 DIAGNOSIS — R05 Cough: Secondary | ICD-10-CM | POA: Diagnosis not present

## 2017-01-13 MED ORDER — LEVOFLOXACIN 500 MG PO TABS: 500.0000 mg | ORAL_TABLET | Freq: Every day | ORAL | 0 refills | Status: AC

## 2017-01-13 MED ORDER — HYDROCODONE-HOMATROPINE 5-1.5 MG/5ML PO SYRP: 5.0000 mL | ORAL_SOLUTION | ORAL | 0 refills | Status: DC | PRN

## 2017-01-13 MED ORDER — CEFTRIAXONE SODIUM 1 G IJ SOLR
1.0000 g | Freq: Once | INTRAMUSCULAR | Status: AC
  Administered 2017-01-13: 1 g via INTRAMUSCULAR

## 2017-01-13 NOTE — Progress Notes (Signed)
   Subjective:    Patient ID: GOLDIE SANSOM, male    DOB: 06-Jan-1951, 66 y.o.   MRN: [id number]  HPI Here for 3 weeks of a non-productive cough and generalized fatigue. No chest pain or fever.    Review of Systems  Constitutional: Positive for fatigue.  HENT: Negative.   Eyes: Negative.   Respiratory: Positive for cough, chest tightness and shortness of breath. Negative for wheezing.   Cardiovascular: Negative.        Objective:   Physical Exam  Constitutional: He is oriented to person, place, and time.  Frail, ill appearing   HENT:  Right Ear: External ear normal.  Left Ear: External ear normal.  Nose: Nose normal.  Mouth/Throat: Oropharynx is clear and moist.  Eyes: Conjunctivae are normal.  Neck: Neck supple. No thyromegaly present.  Cardiovascular: Normal rate, regular rhythm, normal heart sounds and intact distal pulses.   Pulmonary/Chest: Effort normal. No respiratory distress. He has no wheezes.  Rales are present in the left posterior base   Lymphadenopathy:    He has no cervical adenopathy.  Neurological: He is alert and oriented to person, place, and time.          Assessment & Plan:  LLL pneumonia. Treat with a shot of Rocephin and start on 10 days of Levaquin. Get a CXR today. Recheck with [place] on Friday.  Alysia Penna, MD

## 2017-01-13 NOTE — Addendum Note (Signed)
Addended by: Aggie Hacker A on: 01/13/2017 03:07 PM   Modules accepted: Orders

## 2017-01-13 NOTE — Telephone Encounter (Signed)
Noted  

## 2017-01-13 NOTE — Progress Notes (Signed)
Pre visit review using our clinic review tool, if applicable. No additional management support is needed unless otherwise documented below in the visit note. 

## 2017-01-16 ENCOUNTER — Encounter: Payer: Self-pay | Admitting: Family Medicine

## 2017-01-16 ENCOUNTER — Ambulatory Visit (INDEPENDENT_AMBULATORY_CARE_PROVIDER_SITE_OTHER): Payer: Medicare Other | Admitting: Family Medicine

## 2017-01-16 VITALS — BP 94/58 | HR 82 | Temp 97.5°F | Ht 72.0 in | Wt 143.0 lb

## 2017-01-16 DIAGNOSIS — J181 Lobar pneumonia, unspecified organism: Secondary | ICD-10-CM

## 2017-01-16 DIAGNOSIS — J189 Pneumonia, unspecified organism: Secondary | ICD-10-CM

## 2017-01-16 NOTE — Progress Notes (Signed)
Pre visit review using our clinic review tool, if applicable. No additional management support is needed unless otherwise documented below in the visit note. 

## 2017-01-16 NOTE — Progress Notes (Signed)
   Subjective:    Patient ID: Wayne Williams, male    DOB: July 27, 1951, 66 y.o.   MRN: [id number]  HPI Here to follow up on a LLL pneumonia. We saw him on 01-13-17, he was given a shot of Rocephin and was started on a course of Levaquin. He now feels much better though he is still weak.Marland Kitchen His appetite has picked up and he is eating soup. The cough has quieted down and he is able to sleep. No chest pain.    Review of Systems  Constitutional: Positive for fatigue. Negative for chills, diaphoresis and fever.  Respiratory: Positive for cough and shortness of breath. Negative for chest tightness and wheezing.   Cardiovascular: Negative.   Neurological: Negative.        Objective:   Physical Exam  Constitutional: He is oriented to person, place, and time.  Appears ill but he has clearly improved from the last visit   Neck: No thyromegaly present.  Cardiovascular: Normal rate, regular rhythm, normal heart sounds and intact distal pulses.   Pulmonary/Chest: Effort normal. No respiratory distress. He has no rales.  Soft scattered wheezes, no rales   Lymphadenopathy:    He has no cervical adenopathy.  Neurological: He is alert and oriented to person, place, and time.          Assessment & Plan:  Partially treated CAP. He will finish out the last of the Levaquin. Recheck prn.  Alysia Penna, MD

## 2017-02-02 ENCOUNTER — Telehealth: Payer: Self-pay | Admitting: Family Medicine

## 2017-02-02 MED ORDER — HYDROCODONE-HOMATROPINE 5-1.5 MG/5ML PO SYRP: 5.0000 mL | ORAL_SOLUTION | ORAL | 0 refills | Status: DC | PRN

## 2017-02-02 MED ORDER — LEVOFLOXACIN 500 MG PO TABS: 500.0000 mg | ORAL_TABLET | Freq: Every day | ORAL | 0 refills | Status: DC

## 2017-02-02 NOTE — Telephone Encounter (Signed)
Pt still having cough from the pna he had last month and would like a refill of HYDROcodone-homatropine (HYDROMET) 5-1.5 MG/5ML syrup  ALSO levofloxacin (LEVAQUIN) 500 MG tablet  Pt states his grandson has the flu and was around him, since has started coughing more.  [facility]

## 2017-02-02 NOTE — Telephone Encounter (Signed)
Script for syrup is ready for pick up here at front office, script for Levaquin was sent e-scribe to [place]. I spoke with pt and went over this information.

## 2017-02-02 NOTE — Telephone Encounter (Signed)
The cough rx is printed. Call in Levaquin 500 mg daily for 10 days

## 2017-02-03 ENCOUNTER — Encounter: Payer: Self-pay | Admitting: Family Medicine

## 2017-02-03 ENCOUNTER — Telehealth: Payer: Self-pay | Admitting: Family Medicine

## 2017-02-03 ENCOUNTER — Ambulatory Visit (INDEPENDENT_AMBULATORY_CARE_PROVIDER_SITE_OTHER): Payer: Medicare Other | Admitting: Family Medicine

## 2017-02-03 ENCOUNTER — Ambulatory Visit: Payer: Self-pay | Admitting: Family Medicine

## 2017-02-03 VITALS — BP 64/46 | HR 87 | Temp 98.0°F | Wt 139.0 lb

## 2017-02-03 DIAGNOSIS — G4700 Insomnia, unspecified: Secondary | ICD-10-CM | POA: Diagnosis not present

## 2017-02-03 DIAGNOSIS — R066 Hiccough: Secondary | ICD-10-CM | POA: Diagnosis not present

## 2017-02-03 DIAGNOSIS — R634 Abnormal weight loss: Secondary | ICD-10-CM | POA: Diagnosis not present

## 2017-02-03 MED ORDER — TEMAZEPAM 30 MG PO CAPS: 30.0000 mg | ORAL_CAPSULE | Freq: Every evening | ORAL | 2 refills | Status: DC | PRN

## 2017-02-03 MED ORDER — METOCLOPRAMIDE HCL 10 MG PO TABS: 10.0000 mg | ORAL_TABLET | Freq: Four times a day (QID) | ORAL | 2 refills | Status: DC

## 2017-02-03 NOTE — Telephone Encounter (Signed)
[facility] [place] [facility] - [facility] Patient Name: Wayne Williams DOB: [birth year] Initial Comment Caller says, has hiccups for 3 days, wants to know if cough Rx could be causing this ? Nurse Assessment Nurse: Renie Ora, RN, Ashby Dawes Date/Time (Eastern Time): 02/03/2017 2:44:44 PM Confirm and document reason for call. If symptomatic, describe symptoms. ---Caller states he has had hiccups for the past 3 days. States he saw Dr. Sarajane Jews about 3 weeks and diagnosed with pneumonia. States he was given an antibiotic and a cough medicine. Wondering if the cough medicine is giving him the hiccups. Does the patient have any new or worsening symptoms? ---Yes Will a triage be completed? ---Yes Related visit to physician within the last 2 weeks? ---No Does the PT have any chronic conditions? (i.e. diabetes, asthma, etc.) ---Yes List chronic conditions. ---pneumonia about a month ago Is this a behavioral health or substance abuse call? ---No Guidelines Guideline Title Affirmed Question Affirmed Notes Hiccups [1] Hiccups present > 3 hours AND [2] severe AND [3] no improvement using hiccup treatment per protocol Final Disposition User See Physician within 4 Hours (or PCP triage) Renie Ora, RN, Jeanine Comments No voice mail box set up. Unable to leave message. Caller states he was prescribed Homatropine / hydrocodone cough syrup. Nurse looked up this medication on [email or web address]. No listed side effects of hiccups included. Referrals REFERRED TO PCP OFFICE Disagree/Comply: Comply

## 2017-02-03 NOTE — Progress Notes (Signed)
   Subjective:    Patient ID: Wayne Williams, male    DOB: August 27, 1951, 66 y.o.   MRN: [id number]  HPI Here for 3 days of continuous hiccups. He has never had this before. He finds it hard to eat or drink anything because of them. He also mentions trouble sleeping, which started about 3 months ago. He tosses and turns and finds it hard to rest. He was treated here last month with Levaquin for a pneumonia. This has responded well and the coughing has almost totally stopped. No fever. The other issue we are concerned about it is weight loss. He has lost 20 lbs of weight in the past 2 years. He admits to having a poor appetite and not eating much at a time.    Review of Systems  Constitutional: Positive for appetite change and unexpected weight change. Negative for activity change and fever.  Respiratory: Negative.   Cardiovascular: Negative.   Gastrointestinal: Negative.   Neurological: Negative.   Psychiatric/Behavioral: Positive for sleep disturbance.       Objective:   Physical Exam  Constitutional: He is oriented to person, place, and time.  Frail, appears weak and is quite thin . He hiccups every 3-4 seconds   Eyes: No scleral icterus.  Cardiovascular: Normal rate, regular rhythm, normal heart sounds and intact distal pulses.   Pulmonary/Chest: Effort normal and breath sounds normal. No respiratory distress. He has no wheezes. He has no rales.  Abdominal: Soft. Bowel sounds are normal. He exhibits no distension and no mass. There is no tenderness. There is no rebound and no guarding.  Neurological: He is alert and oriented to person, place, and time.          Assessment & Plan:  For the hiccups, he will try Reglan 10 mg QID. For the insomnia, he will try Temazepam. For the weight loss we wil get labs today and set up CT scans of the chest, abdomen, and pelvis to rule out malignancies, etc.  Alysia Penna, MD

## 2017-02-04 LAB — CBC WITH DIFFERENTIAL/PLATELET
Basophils Absolute: 0 10*3/uL (ref 0.0–0.1)
Basophils Relative: 0.5 % (ref 0.0–3.0)
Eosinophils Absolute: 0 10*3/uL (ref 0.0–0.7)
Eosinophils Relative: 0.2 % (ref 0.0–5.0)
HCT: 38.1 % — ABNORMAL LOW (ref 39.0–52.0)
Hemoglobin: 12.7 g/dL — ABNORMAL LOW (ref 13.0–17.0)
Lymphocytes Relative: 34.2 % (ref 12.0–46.0)
Lymphs Abs: 1.2 10*3/uL (ref 0.7–4.0)
MCHC: 33.5 g/dL (ref 30.0–36.0)
MCV: 94 fl (ref 78.0–100.0)
Monocytes Absolute: 0.6 10*3/uL (ref 0.1–1.0)
Monocytes Relative: 17.9 % — ABNORMAL HIGH (ref 3.0–12.0)
Neutro Abs: 1.7 10*3/uL (ref 1.4–7.7)
Neutrophils Relative %: 47.2 % (ref 43.0–77.0)
Platelets: 198 10*3/uL (ref 150.0–400.0)
RBC: 4.05 Mil/uL — ABNORMAL LOW (ref 4.22–5.81)
RDW: 13.4 % (ref 11.5–15.5)
WBC: 3.6 10*3/uL — ABNORMAL LOW (ref 4.0–10.5)

## 2017-02-04 LAB — BASIC METABOLIC PANEL
BUN: 31 mg/dL — ABNORMAL HIGH (ref 6–23)
CO2: 27 mEq/L (ref 19–32)
Calcium: 8.8 mg/dL (ref 8.4–10.5)
Chloride: 101 mEq/L (ref 96–112)
Creatinine, Ser: 1.64 mg/dL — ABNORMAL HIGH (ref 0.40–1.50)
GFR: 44.63 mL/min — ABNORMAL LOW (ref >60.00)
Glucose, Bld: 123 mg/dL — ABNORMAL HIGH (ref 70–99)
Potassium: 4.6 mEq/L (ref 3.5–5.1)
Sodium: 135 mEq/L (ref 135–145)

## 2017-02-04 LAB — HEPATIC FUNCTION PANEL
ALT: 8 U/L (ref 0–53)
AST: 23 U/L (ref 0–37)
Albumin: 3.9 g/dL (ref 3.5–5.2)
Alkaline Phosphatase: 47 U/L (ref 39–117)
Bilirubin, Direct: 0.1 mg/dL (ref 0.0–0.3)
Total Bilirubin: 0.3 mg/dL (ref 0.2–1.2)
Total Protein: 6.5 g/dL (ref 6.0–8.3)

## 2017-02-04 LAB — TSH: TSH: 1.17 u[IU]/mL (ref 0.35–4.50)

## 2017-02-04 NOTE — Telephone Encounter (Signed)
ATC pt, n/a and no vm. Unable to leave message. WCB

## 2017-02-05 NOTE — Telephone Encounter (Signed)
ATC pt, n/a and no vm. Unable to leave message. WCB

## 2017-02-06 NOTE — Telephone Encounter (Signed)
Pt scheduled appt 02/03/17 and canceled. Nothing further needed.

## 2017-02-19 ENCOUNTER — Telehealth: Payer: Self-pay | Admitting: Family Medicine

## 2017-02-19 NOTE — Telephone Encounter (Signed)
Pt would like a call back to discuss his lab results more in depth. Pt received copy in the mail.

## 2017-02-20 NOTE — Telephone Encounter (Signed)
Pt scheduled to see Dr Fry today.  Nothing further needed. 

## 2017-02-23 ENCOUNTER — Ambulatory Visit (INDEPENDENT_AMBULATORY_CARE_PROVIDER_SITE_OTHER): Payer: Medicare Other | Admitting: Family Medicine

## 2017-02-23 ENCOUNTER — Encounter: Payer: Self-pay | Admitting: Family Medicine

## 2017-02-23 VITALS — BP 110/77 | HR 76 | Temp 98.2°F | Ht 72.0 in | Wt 143.0 lb

## 2017-02-23 DIAGNOSIS — I10 Essential (primary) hypertension: Secondary | ICD-10-CM | POA: Diagnosis not present

## 2017-02-23 DIAGNOSIS — E119 Type 2 diabetes mellitus without complications: Secondary | ICD-10-CM

## 2017-02-23 LAB — BASIC METABOLIC PANEL
BUN: 15 mg/dL (ref 6–23)
CO2: 29 mEq/L (ref 19–32)
Calcium: 8.8 mg/dL (ref 8.4–10.5)
Chloride: 105 mEq/L (ref 96–112)
Creatinine, Ser: 1.11 mg/dL (ref 0.40–1.50)
GFR: 70.02 mL/min (ref >60.00)
Glucose, Bld: 81 mg/dL (ref 70–99)
Potassium: 4.9 mEq/L (ref 3.5–5.1)
Sodium: 139 mEq/L (ref 135–145)

## 2017-02-23 LAB — HEMOGLOBIN A1C: Hgb A1c MFr Bld: 6.5 % (ref 4.6–6.5)

## 2017-02-23 NOTE — Progress Notes (Signed)
   Subjective:    Patient ID: Wayne Williams, male    DOB: [birth year], 66 y.o.   MRN: [id number]  HPI Here to follow up. He was here 3 weeks ago and had some labs drawn. These results included a creatinine of 1.64 and a GFR of 45. He was advised to drink more water and he has done so. He does not use NSAIDs at all. He complains today of brief spells of lightheadedness when he stands up from a sitting or lying position. These last several minutes and then go away.    Review of Systems  Constitutional: Negative.   Respiratory: Negative.   Cardiovascular: Negative.   Neurological: Positive for light-headedness. Negative for dizziness, weakness and headaches.       Objective:   Physical Exam  Constitutional: He is oriented to person, place, and time. He appears well-developed and well-nourished.  Neck: No thyromegaly present.  Cardiovascular: Normal rate, regular rhythm, normal heart sounds and intact distal pulses.   Pulmonary/Chest: Effort normal and breath sounds normal.  Lymphadenopathy:    He has no cervical adenopathy.  Neurological: He is alert and oriented to person, place, and time.          Assessment & Plan:  His lightheadedness sounds like orthostasis to me. He will continue to drink fluids. He will stop taking Metoprolol as well/. u For the renal disease we will check another BMET today. He has gained 4 lb since the last visit.  Alysia Penna, MD

## 2017-02-23 NOTE — Progress Notes (Signed)
Pre visit review using our clinic review tool, if applicable. No additional management support is needed unless otherwise documented below in the visit note. 

## 2017-02-24 ENCOUNTER — Telehealth: Payer: Self-pay | Admitting: Family Medicine

## 2017-02-24 NOTE — Telephone Encounter (Signed)
-----   Message ----- From: Laurey Morale, MD Sent: 02/23/2017   5:42 PM To: Carney Bern Subject: RE: referral                                   Yes I would definitely like to get the CT scans done because his weight loss is worrisome   ----- Message ----- From: Carney Bern Sent: 02/23/2017   3:12 PM To: Laurey Morale, MD Subject: referral                                       PT wanted to know if he still need the CT - pt states he does not need the CT and he knows why he has  Weight loss,please advise   Called the pt on 02/24/2017 again . Informed him of the reply msg from Dr Sarajane Jews.   Pt states he still does not feel he needed CT  I informed sylvia of this she relay the msg to Dr fry . I was told to cancel pt CT . I informed pt of canceled CT .

## 2017-02-25 ENCOUNTER — Inpatient Hospital Stay: Admission: RE | Admit: 2017-02-25 | Payer: Medicare Other | Source: Ambulatory Visit

## 2017-04-20 ENCOUNTER — Telehealth: Payer: Self-pay | Admitting: Family Medicine

## 2017-04-20 NOTE — Telephone Encounter (Signed)
Returned call to patient; patient states that he has slight redness and minimal discomfort around one tooth on rt lower side; patient reports he does not see a dentist regularly. Patient doesn't feel that the problem is severe enough to need to be seen today and site is not swelling as he previously thought. Recommended warm salt water mouth rinses and encouraged proper flossing and brushing, also encouraged patient to call his insurance company to see if he has dental coverage and which dentists are in network if so, patient voiced understanding to all instructions given. Patient to return call if symptoms worsen.

## 2017-04-20 NOTE — Telephone Encounter (Signed)
Patient Name: Wayne Williams  DOB: 1951-03-22    Initial Comment Caller states he has soreness and swelling on his gum.    Nurse Assessment  Nurse: Leilani Merl, RN, Heather Date/Time (Eastern Time): 04/20/2017 2:16:11 PM  Confirm and document reason for call. If symptomatic, describe symptoms. ---Caller states he has soreness and swelling on his gum. It is only slightly swollen and a little sore, but his teeth are not good.  Does the patient have any new or worsening symptoms? ---Yes  Will a triage be completed? ---Yes  Related visit to physician within the last 2 weeks? ---No  Does the PT have any chronic conditions? (i.e. diabetes, asthma, etc.) ---Yes  List chronic conditions. ---See MR  Is this a behavioral health or substance abuse call? ---No     Guidelines    Guideline Title Affirmed Question Affirmed Notes  Mouth Symptoms All other mouth symptoms (Exceptions: dry mouth from not drinking enough liquids, chapped lips)    Final Disposition User   See PCP within 2 Weeks Standifer, RN, Water quality scientist    Comments  caller is wondering if someone can recommend a dentist to him, if someone from the office could call him and let him know.   Referrals  GO TO FACILITY UNDECIDED   Disagree/Comply: Comply

## 2017-06-12 ENCOUNTER — Emergency Department (HOSPITAL_COMMUNITY)
Admission: EM | Admit: 2017-06-12 | Discharge: 2017-06-12 | Disposition: A | Discharge status: Home / Self Care | Payer: Medicare Other | Attending: Emergency Medicine | Admitting: Emergency Medicine

## 2017-06-12 ENCOUNTER — Emergency Department (HOSPITAL_COMMUNITY): Payer: Medicare Other

## 2017-06-12 ENCOUNTER — Telehealth: Payer: Self-pay | Admitting: Surgical

## 2017-06-12 ENCOUNTER — Ambulatory Visit: Payer: Medicare Other | Admitting: Family Medicine

## 2017-06-12 DIAGNOSIS — F17210 Nicotine dependence, cigarettes, uncomplicated: Secondary | ICD-10-CM | POA: Insufficient documentation

## 2017-06-12 DIAGNOSIS — E119 Type 2 diabetes mellitus without complications: Secondary | ICD-10-CM | POA: Diagnosis not present

## 2017-06-12 DIAGNOSIS — R51 Headache: Secondary | ICD-10-CM | POA: Insufficient documentation

## 2017-06-12 DIAGNOSIS — Z79899 Other long term (current) drug therapy: Secondary | ICD-10-CM | POA: Diagnosis not present

## 2017-06-12 DIAGNOSIS — I2510 Atherosclerotic heart disease of native coronary artery without angina pectoris: Secondary | ICD-10-CM | POA: Diagnosis not present

## 2017-06-12 DIAGNOSIS — Z7982 Long term (current) use of aspirin: Secondary | ICD-10-CM | POA: Insufficient documentation

## 2017-06-12 DIAGNOSIS — R519 Headache, unspecified: Secondary | ICD-10-CM

## 2017-06-12 DIAGNOSIS — I10 Essential (primary) hypertension: Secondary | ICD-10-CM | POA: Diagnosis not present

## 2017-06-12 DIAGNOSIS — Z7984 Long term (current) use of oral hypoglycemic drugs: Secondary | ICD-10-CM | POA: Insufficient documentation

## 2017-06-12 NOTE — Telephone Encounter (Signed)
He needs to go to the ED for this, especially with the weekend coming up

## 2017-06-12 NOTE — Telephone Encounter (Signed)
Spoke with patient and he stated that he does not have a headache currently. He stated that he had one of the worse head pain that lasted about 1 minute last night. Pain was in the temple area that radiated to the ear and the back of the skull. It was a stabbing pain.He states that he just bent over and held the head until pain eased up.  It has gone away now, but if he takes in a deep breath in through his nose it is a dull ache. He stated that when the pain hit him that there was no numbness, weakness, dizziness, or blurred vision. No other symptoms. He did say that he has had 2 strokes in the past with no warning. He said that this did scare him though. He did say that he has an appointment with his grandson at 2:30 PM and has to be there 30 minutes early. He does not want to miss this appointment with grandson.

## 2017-06-12 NOTE — ED Provider Notes (Signed)
[facility] DEPT Provider Note   CSN: [id number] Arrival date & time: 06/12/17  1657     History   Chief Complaint Chief Complaint  Patient presents with  . Headache    HPI Wayne Williams is a 66 y.o. male.  Patient c/o headache last night at rest. States was sitting watching tv. Acute onset frontal headache. States was severe, but lasted 1 minute. No headache currently. Denies neck pain or stiffness. No associated nausea or vomiting. No eye pain or change in vision. No numbness/weakness. No change in speech. No change in balance, coordination, or normal function. Normal appetite today. Denies sinus drainage or congestion. No fever or chills.    The history is provided by the patient.  Headache   Pertinent negatives include no fever, no shortness of breath, no nausea and no vomiting.    Past Medical History:  Diagnosis Date  . Anxiety   . CAD (coronary artery disease)    s/p cabg x 4  . Cerebrovascular accident (Wayne Williams)    hx of 06 20 09  . Depression    saw Dr. Casimiro Needle in the past  . Diabetes mellitus   . ED (erectile dysfunction)   . GERD (gastroesophageal reflux disease)   . Hypertension   . Insomnia     Patient Active Problem List   Diagnosis Date Noted  . Diabetes mellitus without complication (Wayne Williams) 25/36/6440  . Cerebral artery occlusion with cerebral infarction (Wayne Williams) 08/22/2010  . ABNORMAL FINDINGS GI TRACT 08/22/2010  . CONSTIPATION 08/19/2010  . MICROSCOPIC HEMATURIA 08/19/2010  . LOSS OF WEIGHT 08/19/2010  . ABDOMINAL PAIN, GENERALIZED 08/19/2010  . Hyperlipidemia 10/10/2009  . BACK PAIN, LUMBAR 05/11/2009  . ANXIETY 08/11/2008  . NICOTINE ADDICTION 08/11/2008  . DEPRESSION 08/11/2008  . Essential hypertension 08/11/2008  . MYOCARDIAL INFARCTION, HX OF 08/11/2008  . Coronary atherosclerosis 08/11/2008  . GERD 08/11/2008  . CEREBROVASCULAR ACCIDENT, HX OF 08/11/2008    Past Surgical History:  Procedure Laterality Date  . CERVICAL LAMINECTOMY   1996   per Wayne Williams  . Heart bypass  2001  . LEFT HEART CATHETERIZATION WITH CORONARY/GRAFT ANGIOGRAM N/A 01/24/2015   Procedure: LEFT HEART CATHETERIZATION WITH Wayne Williams;  Surgeon: Wayne Blanks, MD;  Location: [facility] CATH LAB;  Service: Cardiovascular;  Laterality: N/A;  . Wayne Williams   per Dr. Gladstone Lighter       Home Medications    Prior to Admission medications   Medication Sig Start Date End Date Taking? Authorizing Provider  ALPRAZolam Duanne Moron) 0.5 MG tablet TAKE ONE TABLET BY MOUTH 4 TIMES DAILY AS NEEDED 12/18/16   Laurey Morale, MD  aspirin 81 MG tablet Take 81 mg by mouth daily.      [provider]  clopidogrel (PLAVIX) 75 MG tablet Take 1 tablet (75 mg total) by mouth daily. 07/22/16   Laurey Morale, MD  HYDROcodone-homatropine (HYDROMET) 5-1.5 MG/5ML syrup Take 5 mLs by mouth every 4 (four) hours as needed. Patient not taking: Reported on 02/23/2017 02/02/17   Laurey Morale, MD  levofloxacin (LEVAQUIN) 500 MG tablet Take 1 tablet (500 mg total) by mouth daily. Patient not taking: Reported on 02/23/2017 02/02/17   Laurey Morale, MD  lisinopril (PRINIVIL,ZESTRIL) 10 MG tablet Take 1 tablet (10 mg total) by mouth daily. 07/22/16   Laurey Morale, MD  metFORMIN (GLUCOPHAGE) 1000 MG tablet TAKE ONE TABLET BY MOUTH TWICE DAILY 07/22/16   Laurey Morale, MD  metoCLOPramide (REGLAN) 10 MG tablet  Take 1 tablet (10 mg total) by mouth 4 (four) times daily. Patient not taking: Reported on 02/23/2017 02/03/17   Laurey Morale, MD  nitroGLYCERIN (NITROSTAT) 0.4 MG SL tablet Place 1 tablet (0.4 mg total) under the tongue every 5 (five) minutes as needed for chest pain. Patient not taking: Reported on 01/13/2017 07/22/16   Laurey Morale, MD  simvastatin (ZOCOR) 40 MG tablet Take 1 tablet (40 mg total) by mouth at bedtime. 07/22/16   Laurey Morale, MD  tamsulosin (FLOMAX) 0.4 MG CAPS capsule Take 1 capsule (0.4 mg total) by mouth daily. 07/22/16   Laurey Morale, MD  temazepam (RESTORIL) 30 MG capsule Take 1 capsule (30 mg total) by mouth at bedtime as needed for sleep. Patient not taking: Reported on 02/23/2017 02/03/17   Laurey Morale, MD  triamcinolone cream (KENALOG) 0.1 % Apply 1 application topically 2 (two) times daily. 04/24/15   Laurey Morale, MD    Family History Family History  Problem Relation Age of Onset  . Coronary artery disease Unknown   . Hypertension Unknown     Social History Social History  Substance Use Topics  . Smoking status: Current Every Day Smoker    Packs/day: 1.00    Years: 50.00    Types: Cigarettes  . Smokeless tobacco: Never Used  . Alcohol use No     Allergies   Patient has no known allergies.   Review of Systems Review of Systems  Constitutional: Negative for fever.  HENT: Negative for sore throat.   Eyes: Negative for pain and visual disturbance.  Respiratory: Negative for shortness of breath.   Cardiovascular: Negative for chest pain.  Gastrointestinal: Negative for abdominal pain, nausea and vomiting.  Genitourinary: Negative for flank pain.  Musculoskeletal: Negative for neck pain and neck stiffness.  Skin: Negative for rash.  Neurological: Positive for headaches. Negative for syncope, speech difficulty, weakness and numbness.  Hematological: Does not bruise/bleed easily.  Psychiatric/Behavioral: Negative for confusion.     Physical Exam Updated Vital Signs BP 134/73 (BP Location: Right Arm)   Pulse 94   Temp 98.4 F (36.9 C) (Oral)   Resp 15   SpO2 100%   Physical Exam  Constitutional: He is oriented to person, place, and time. He appears well-developed and well-nourished. No distress.  HENT:  Right Ear: External ear normal.  Left Ear: External ear normal.  Mouth/Throat: Oropharynx is clear and moist.  No sinus or temporal tenderness.   Eyes: Conjunctivae and EOM are normal. Pupils are equal, round, and reactive to light.  Neck: Normal range of motion. Neck supple. No  tracheal deviation present.  No stiffness or rigidity  Cardiovascular: Normal rate, regular rhythm, normal heart sounds and intact distal pulses.  Exam reveals no gallop and no friction rub.   No murmur heard. Pulmonary/Chest: Effort normal and breath sounds normal. No accessory muscle usage. No respiratory distress.  Abdominal: He exhibits no distension. There is no tenderness.  Musculoskeletal: He exhibits no edema.  Neurological: He is alert and oriented to person, place, and time.  Speech clear and fluent. No cran n deficit noted. Motor intact bil, stre 5/5. No pronator drift. Steady gait.   Skin: Skin is warm and dry. He is not diaphoretic.  Psychiatric: He has a normal mood and affect.  Nursing note and vitals reviewed.    ED Treatments / Results  Labs (all labs ordered are listed, but only abnormal results are displayed) Labs Reviewed - No data to  display  EKG  EKG Interpretation None       Radiology Ct Head Wo Contrast  Result Date: 06/12/2017 CLINICAL DATA:  Worse headache of life. EXAM: CT HEAD WITHOUT CONTRAST TECHNIQUE: Contiguous axial images were obtained from the base of the skull through the vertex without intravenous contrast. COMPARISON:  09/24/2011 FINDINGS: Brain: No evidence of acute infarction, hemorrhage, hydrocephalus, extra-axial collection or mass lesion/mass effect. Chronic small vessel ischemia in the deep cerebral white matter. Remote lacunar infarct in the left putamen and left corona radiata. There is also left caudate dilated perivascular space based on 2009 MRI. Caudate lacunar infarcts are better seen on previous head CT. No notable progression compared to prior. Cerebral volume loss with sulcal widening at the vertex, mildly progressed. Vascular: Atherosclerotic calcification.  No hyperdense vessel. Skull: No acute or aggressive finding. Sinuses/Orbits: Negative.  No explanation for headache. IMPRESSION: 1. No acute finding. 2. Chronic small vessel  ischemia without notable progression since 2012. Electronically Signed   By: Monte Fantasia M.D.   On: 06/12/2017 19:21    Procedures Procedures (including critical care time)  Medications Ordered in ED Medications - No data to display   Initial Impression / Assessment and Plan / ED Course  I have reviewed the triage vital signs and the nursing notes.  Pertinent labs & imaging results that were available during my care of the patient were reviewed by me and considered in my medical decision making (see chart for details).  CT.  Reviewed nursing notes and prior charts for additional history.   Patient declines wanting or needing any medication in ED - no pain or discomfort.   Patients symptoms v atypical, approx 1 minute. No pain currently. Ct neg.   Patient remains asymptomatic, and appears stable for d/c.   As unusual/atypical headache for pt, will have f/u closely pcp, neurology f/u.   Final Clinical Impressions(s) / ED Diagnoses   Final diagnoses:  None    New Prescriptions New Prescriptions   No medications on file     Lajean Saver, MD 06/12/17 [phone]

## 2017-06-12 NOTE — Telephone Encounter (Signed)
Spoke with pt and gave recommendations. He is willing to go to ED for evaluation. Nothing further needed at this time.

## 2017-06-12 NOTE — ED Notes (Addendum)
Pt refused d/c vitals and left without his paperwork. Ambulatory with a steady gait and no distress noted upon leaving.

## 2017-06-12 NOTE — Telephone Encounter (Signed)
Pt was advised this morning that he needed evaluation ASAP and refused ED at that time also. He agreed to be seen at [facility] as our schedule was full, however they also referred him to ED.  Pt called [place] back to state that he does not want to go to ED. He would like to know what Dr. Sarajane Jews thinks about his symptoms. He states that he does not have the time or the money for an ED visit and CT scan. He does not feel like he had a stroke but is not sure what the headache is. He does not want to see anyone other than Dr. Sarajane Jews if possible. He says he "wants to wait until he has another episode and then he will go to ED"...    Dr. Sarajane Jews - Please advise. Thanks!

## 2017-06-12 NOTE — ED Notes (Signed)
Per Dr Ashok Cordia, no orders at this time-will assess once in acute room

## 2017-06-12 NOTE — Telephone Encounter (Signed)
ATC pt but n/a and no vm WCB

## 2017-06-12 NOTE — ED Triage Notes (Signed)
Per pt, states he was watching TV last night-states all of the sudden he got the worst H/A that it lasted one minute-states stated in both temples and radiated to back of head-states he woke up this am and with slight discomfort-states no headache now-PCP sent hm here for work up

## 2017-06-12 NOTE — ED Notes (Signed)
ED Provider at bedside. 

## 2017-06-12 NOTE — Discharge Instructions (Signed)
It was our pleasure to provide your ER care today - we hope that you feel better.  Return to ER right away if acute/severe headache, persistent vomiting, numbness/weakness, or other concern.   Follow up with primary care doctor/neurology in the coming week.

## 2017-06-12 NOTE — Telephone Encounter (Signed)
Spoke with patient and he is aware ED is more appropriate for his symptoms. He agrees and plans to go to [facility].

## 2017-06-12 NOTE — ED Notes (Signed)
Patient transported to CT 

## 2017-06-15 NOTE — Telephone Encounter (Signed)
Pt seen and evaluated at [facility] ED. Nothing further needed.

## 2017-06-17 ENCOUNTER — Other Ambulatory Visit: Payer: Self-pay | Admitting: Family Medicine

## 2017-06-17 NOTE — Telephone Encounter (Signed)
Call in #[phone] with 5 rf 

## 2017-06-19 ENCOUNTER — Other Ambulatory Visit: Payer: Self-pay | Admitting: Family Medicine

## 2017-06-19 ENCOUNTER — Telehealth: Payer: Self-pay | Admitting: Family Medicine

## 2017-06-19 NOTE — Telephone Encounter (Signed)
[facility] will not have alprazolam until Monday 06-22-17 and pt does not want to wait until Monday. Pt is switching pharm  [facility] [place] [facility] alprazolam 0.5 mg # 120 w/refills

## 2017-06-19 NOTE — Telephone Encounter (Signed)
Per Dr. Sarajane Jews okay to [facility]. I spoke with pharmacy and gave verbal, also spoke with pt.

## 2017-06-23 ENCOUNTER — Other Ambulatory Visit: Payer: Self-pay | Admitting: Family Medicine

## 2017-06-23 MED ORDER — METFORMIN HCL 1000 MG PO TABS: ORAL_TABLET | ORAL | 0 refills | Status: DC

## 2017-06-30 ENCOUNTER — Telehealth: Payer: Self-pay | Admitting: Family Medicine

## 2017-06-30 NOTE — Telephone Encounter (Signed)
Pt would like to know if you will write a letter excusing him from jury duty.  Pt has been summoned to the [employer] for jury duty and the walk is very far. Pt states he needs to be there at 8:30 and does not feel he has the health to be there. Please advise.

## 2017-06-30 NOTE — Telephone Encounter (Signed)
This note was written

## 2017-06-30 NOTE — Telephone Encounter (Signed)
Note ready for pick up here at front office and I spoke with pt.

## 2017-08-24 ENCOUNTER — Other Ambulatory Visit: Payer: Self-pay | Admitting: Family Medicine

## 2017-08-28 ENCOUNTER — Telehealth: Payer: Self-pay | Admitting: Family Medicine

## 2017-08-28 ENCOUNTER — Ambulatory Visit: Payer: Medicare Other | Admitting: Family Medicine

## 2017-08-28 MED ORDER — VARENICLINE TARTRATE 0.5 MG X 11 & 1 MG X 42 PO MISC: ORAL | 0 refills | Status: DC

## 2017-08-28 NOTE — Telephone Encounter (Signed)
I [facility] and spoke with pt.  

## 2017-08-28 NOTE — Telephone Encounter (Signed)
° °  Pt said he would like to try Chantix to help you stop smoking. Pt is going to contact his insurance company to ask if they will pay for the medicine

## 2017-08-28 NOTE — Telephone Encounter (Signed)
Call in a Chantix starter pack  

## 2017-08-28 NOTE — Telephone Encounter (Signed)
I spoke with pt and insurance will cover 45 day supply of Chantix, if approved [facility] [place] [facility] [address].

## 2017-09-19 ENCOUNTER — Other Ambulatory Visit: Payer: Self-pay | Admitting: Family Medicine

## 2017-09-22 ENCOUNTER — Telehealth: Payer: Self-pay | Admitting: Family Medicine

## 2017-09-22 MED ORDER — TRIAMCINOLONE ACETONIDE 0.1 % EX CREA: 1.0000 "application " | TOPICAL_CREAM | Freq: Two times a day (BID) | CUTANEOUS | 5 refills | Status: DC | PRN

## 2017-09-22 NOTE — Telephone Encounter (Signed)
Call in Triamcinolone 0.1% cream to apply bid prn rash, 45 gm with 5 rf

## 2017-09-22 NOTE — Telephone Encounter (Signed)
I [facility] 

## 2017-09-22 NOTE — Telephone Encounter (Signed)
Refill request for Triamcinolone cream, not sure of dose or directions, [facility].

## 2017-09-28 ENCOUNTER — Other Ambulatory Visit: Payer: Self-pay | Admitting: Family Medicine

## 2017-10-18 ENCOUNTER — Other Ambulatory Visit: Payer: Self-pay | Admitting: Family Medicine

## 2017-10-22 ENCOUNTER — Other Ambulatory Visit: Payer: Self-pay | Admitting: Family Medicine

## 2017-10-23 ENCOUNTER — Other Ambulatory Visit: Payer: Self-pay | Admitting: Family Medicine

## 2017-11-22 ENCOUNTER — Other Ambulatory Visit: Payer: Self-pay | Admitting: Family Medicine

## 2017-11-23 ENCOUNTER — Other Ambulatory Visit: Payer: Self-pay | Admitting: Family Medicine

## 2017-12-21 ENCOUNTER — Other Ambulatory Visit: Payer: Self-pay

## 2017-12-21 MED ORDER — METFORMIN HCL 1000 MG PO TABS
1000.0000 mg | ORAL_TABLET | Freq: Two times a day (BID) | ORAL | 0 refills | Status: DC
Start: 2017-12-21 — End: 2017-12-25

## 2017-12-21 NOTE — Telephone Encounter (Signed)
Fax request sent for a 90 day refill of metformin HCL 1,000 MG tablet. [facility]

## 2017-12-25 ENCOUNTER — Telehealth: Payer: Self-pay

## 2017-12-25 ENCOUNTER — Other Ambulatory Visit: Payer: Self-pay

## 2017-12-25 MED ORDER — METFORMIN HCL 1000 MG PO TABS: 1000.0000 mg | ORAL_TABLET | Freq: Two times a day (BID) | ORAL | 0 refills | Status: DC

## 2017-12-25 NOTE — Telephone Encounter (Signed)
Correction error is disp amount. Resent metformin 1000 MG 1 tabelt twice daily disp amount 180 with no refilled. Rx was sent on 12/21/2017 disp amount was 120 should have been 180

## 2017-12-25 NOTE — Telephone Encounter (Signed)
[facility] not [place] [facility]. Called [facility] to canceled Rx sent today 12/28. Rx resent with correct disp amount for a 90 day supply [facility] 180. Pt advised that this was done.

## 2017-12-28 ENCOUNTER — Other Ambulatory Visit: Payer: Self-pay | Admitting: Family Medicine

## 2017-12-30 NOTE — Telephone Encounter (Signed)
Last refilled 06/17/2017 disp 120 with 5 refills. Last OV 02/23/2017. Sent to PCP for approval.

## 2017-12-31 ENCOUNTER — Telehealth: Payer: Self-pay | Admitting: Family Medicine

## 2017-12-31 NOTE — Telephone Encounter (Signed)
Copied from Andrew. Topic: General - Other >> Dec 31, 2017  4:02 PM Darl Householder, RMA wrote: Reason for CRM: medication refill request for Alprazolam 0.5 mg [facility] [place] on [address]

## 2017-12-31 NOTE — Telephone Encounter (Signed)
Last OV 02/23/2017. Rx was last refilled 06/17/2017 disp 30 with 5 refills. Sent to PCP for approval.

## 2018-01-01 MED ORDER — ALPRAZOLAM 0.5 MG PO TABS: ORAL_TABLET | ORAL | 5 refills | Status: DC

## 2018-01-01 NOTE — Telephone Encounter (Signed)
Call in #[phone] with 5 rf 

## 2018-01-01 NOTE — Telephone Encounter (Signed)
Call was called into pt's pharmacy.

## 2018-01-20 ENCOUNTER — Other Ambulatory Visit: Payer: Self-pay | Admitting: Family Medicine

## 2018-01-21 ENCOUNTER — Telehealth: Payer: Self-pay

## 2018-01-21 NOTE — Telephone Encounter (Signed)
Last Ov 01/2017.  Rx was last refilled 10/19/2017 disp 90 with 0 refills.  Sent to PCP for approval

## 2018-01-21 NOTE — Telephone Encounter (Signed)
RF 90 day supply of tamsulosin 0.4 MG cap.

## 2018-01-21 NOTE — Telephone Encounter (Signed)
Refill tamsulosin HCL 0.4 mg Capsule    90 day supply

## 2018-01-22 NOTE — Telephone Encounter (Signed)
Last OV 02/23/2017  Last refilled

## 2018-03-28 ENCOUNTER — Other Ambulatory Visit: Payer: Self-pay | Admitting: Family Medicine

## 2018-03-31 ENCOUNTER — Other Ambulatory Visit: Payer: Self-pay | Admitting: Family Medicine

## 2018-04-20 ENCOUNTER — Other Ambulatory Visit: Payer: Self-pay | Admitting: Family Medicine

## 2018-05-23 ENCOUNTER — Other Ambulatory Visit: Payer: Self-pay | Admitting: Family Medicine

## 2018-05-25 NOTE — Telephone Encounter (Signed)
Pt is due for an OV for more refills

## 2018-06-02 ENCOUNTER — Ambulatory Visit: Payer: Medicare Other | Admitting: Family Medicine

## 2018-06-22 ENCOUNTER — Other Ambulatory Visit: Payer: Self-pay | Admitting: Family Medicine

## 2018-06-24 ENCOUNTER — Other Ambulatory Visit: Payer: Self-pay | Admitting: Family Medicine

## 2018-06-25 ENCOUNTER — Other Ambulatory Visit: Payer: Self-pay | Admitting: Family Medicine

## 2018-06-25 NOTE — Telephone Encounter (Signed)
Last OV 02/23/2017   Last refilled 01/01/2018 disp 120 with 5 refills   Pt is due for an OV  Sent to PCP for approval

## 2018-06-25 NOTE — Telephone Encounter (Signed)
Sent to PCP for approval   Last OV 02/23/2017   Last refilled 05/25/2018 disp 30 with no refills   Pt is due for an OV sent to PCP to advice if or to send in refill or deny and call pt to make an OV?

## 2018-06-25 NOTE — Telephone Encounter (Signed)
Call in #120 but no refills. He needs an OV soon

## 2018-06-26 ENCOUNTER — Other Ambulatory Visit: Payer: Self-pay | Admitting: Family Medicine

## 2018-06-28 ENCOUNTER — Other Ambulatory Visit: Payer: Self-pay | Admitting: Family Medicine

## 2018-06-29 ENCOUNTER — Encounter: Payer: Self-pay | Admitting: Family Medicine

## 2018-06-29 ENCOUNTER — Ambulatory Visit (INDEPENDENT_AMBULATORY_CARE_PROVIDER_SITE_OTHER): Payer: Medicare HMO | Admitting: Family Medicine

## 2018-06-29 VITALS — BP 90/52 | HR 124 | Temp 98.3°F | Ht 72.0 in | Wt 151.8 lb

## 2018-06-29 DIAGNOSIS — N138 Other obstructive and reflux uropathy: Secondary | ICD-10-CM | POA: Diagnosis not present

## 2018-06-29 DIAGNOSIS — N401 Enlarged prostate with lower urinary tract symptoms: Secondary | ICD-10-CM | POA: Diagnosis not present

## 2018-06-29 DIAGNOSIS — I10 Essential (primary) hypertension: Secondary | ICD-10-CM | POA: Diagnosis not present

## 2018-06-29 DIAGNOSIS — R634 Abnormal weight loss: Secondary | ICD-10-CM

## 2018-06-29 DIAGNOSIS — E119 Type 2 diabetes mellitus without complications: Secondary | ICD-10-CM

## 2018-06-29 MED ORDER — ALPRAZOLAM 0.5 MG PO TABS: ORAL_TABLET | ORAL | 5 refills | Status: DC

## 2018-06-29 NOTE — Progress Notes (Signed)
   Subjective:    Patient ID: FIN HUPP, male    DOB: 26-Mar-1951, 67 y.o.   MRN: [id number]  HPI Here to follow up. He is doing fairly well in general. His weight is up about 8 lbs from last year. His daughter is encouraging him to eat as much as possible. His BP is stable. He has not checked his glucoses for awhile.    Review of Systems  Constitutional: Negative.   Respiratory: Negative.   Cardiovascular: Negative.   Gastrointestinal: Negative.   Genitourinary: Negative.   Neurological: Negative.        Objective:   Physical Exam  Constitutional: He is oriented to person, place, and time.  Thin, frail   Neck: No thyromegaly present.  Cardiovascular: Regular rhythm, normal heart sounds and intact distal pulses.  Rapid rate   Pulmonary/Chest: Effort normal and breath sounds normal. No stridor. No respiratory distress. He has no wheezes. He has no rales.  Musculoskeletal: He exhibits no edema.  Lymphadenopathy:    He has no cervical adenopathy.  Neurological: He is alert and oriented to person, place, and time.          Assessment & Plan:  He seems to be doing well. He will schedule to return soon for fasting labs including an A1c.  Alysia Penna, MD

## 2018-07-05 ENCOUNTER — Other Ambulatory Visit (INDEPENDENT_AMBULATORY_CARE_PROVIDER_SITE_OTHER): Payer: Medicare HMO

## 2018-07-05 DIAGNOSIS — N401 Enlarged prostate with lower urinary tract symptoms: Secondary | ICD-10-CM

## 2018-07-05 DIAGNOSIS — N138 Other obstructive and reflux uropathy: Secondary | ICD-10-CM

## 2018-07-05 DIAGNOSIS — E119 Type 2 diabetes mellitus without complications: Secondary | ICD-10-CM | POA: Diagnosis not present

## 2018-07-05 LAB — CBC WITH DIFFERENTIAL/PLATELET
Basophils Absolute: 0.1 10*3/uL (ref 0.0–0.1)
Basophils Relative: 0.8 % (ref 0.0–3.0)
Eosinophils Absolute: 0.4 10*3/uL (ref 0.0–0.7)
Eosinophils Relative: 4.7 % (ref 0.0–5.0)
HCT: 40.1 % (ref 39.0–52.0)
Hemoglobin: 13.6 g/dL (ref 13.0–17.0)
Lymphocytes Relative: 31.1 % (ref 12.0–46.0)
Lymphs Abs: 2.4 10*3/uL (ref 0.7–4.0)
MCHC: 34 g/dL (ref 30.0–36.0)
MCV: 93.7 fl (ref 78.0–100.0)
Monocytes Absolute: 0.6 10*3/uL (ref 0.1–1.0)
Monocytes Relative: 8.3 % (ref 3.0–12.0)
Neutro Abs: 4.3 10*3/uL (ref 1.4–7.7)
Neutrophils Relative %: 55.1 % (ref 43.0–77.0)
Platelets: 259 10*3/uL (ref 150.0–400.0)
RBC: 4.28 Mil/uL (ref 4.22–5.81)
RDW: 13.8 % (ref 11.5–15.5)
WBC: 7.7 10*3/uL (ref 4.0–10.5)

## 2018-07-05 LAB — POC URINALSYSI DIPSTICK (AUTOMATED)
Blood, UA: POSITIVE
Glucose, UA: NEGATIVE
Ketones, UA: NEGATIVE
Leukocytes, UA: NEGATIVE
Nitrite, UA: NEGATIVE
Protein, UA: POSITIVE — AB
Spec Grav, UA: >=1.03 — AB (ref 1.010–1.025)
Urobilinogen, UA: 0.2 E.U./dL
pH, UA: 5.5 (ref 5.0–8.0)

## 2018-07-05 LAB — BASIC METABOLIC PANEL
BUN: 32 mg/dL — ABNORMAL HIGH (ref 6–23)
CO2: 28 mEq/L (ref 19–32)
Calcium: 9.4 mg/dL (ref 8.4–10.5)
Chloride: 104 mEq/L (ref 96–112)
Creatinine, Ser: 1.46 mg/dL (ref 0.40–1.50)
GFR: 50.83 mL/min — ABNORMAL LOW (ref >60.00)
Glucose, Bld: 126 mg/dL — ABNORMAL HIGH (ref 70–99)
Potassium: 5.4 mEq/L — ABNORMAL HIGH (ref 3.5–5.1)
Sodium: 139 mEq/L (ref 135–145)

## 2018-07-05 LAB — HEPATIC FUNCTION PANEL
ALT: 11 U/L (ref 0–53)
AST: 17 U/L (ref 0–37)
Albumin: 4.1 g/dL (ref 3.5–5.2)
Alkaline Phosphatase: 44 U/L (ref 39–117)
Bilirubin, Direct: 0.1 mg/dL (ref 0.0–0.3)
Total Bilirubin: 0.4 mg/dL (ref 0.2–1.2)
Total Protein: 6.6 g/dL (ref 6.0–8.3)

## 2018-07-05 LAB — LIPID PANEL
Cholesterol: 113 mg/dL (ref 0–200)
HDL: 42.9 mg/dL (ref >39.00)
LDL Cholesterol: 52 mg/dL (ref 0–99)
NonHDL: 69.86
Total CHOL/HDL Ratio: 3
Triglycerides: 87 mg/dL (ref 0.0–149.0)
VLDL: 17.4 mg/dL (ref 0.0–40.0)

## 2018-07-05 LAB — TSH: TSH: 1.36 u[IU]/mL (ref 0.35–4.50)

## 2018-07-05 LAB — HEMOGLOBIN A1C: Hgb A1c MFr Bld: 6.3 % (ref 4.6–6.5)

## 2018-07-05 LAB — PSA: PSA: 1.67 ng/mL (ref 0.10–4.00)

## 2018-07-09 ENCOUNTER — Ambulatory Visit (INDEPENDENT_AMBULATORY_CARE_PROVIDER_SITE_OTHER): Payer: Medicare HMO | Admitting: Family Medicine

## 2018-07-09 ENCOUNTER — Encounter: Payer: Self-pay | Admitting: Family Medicine

## 2018-07-09 VITALS — BP 98/62 | HR 102 | Temp 98.4°F | Ht 70.0 in | Wt 151.8 lb

## 2018-07-09 DIAGNOSIS — I959 Hypotension, unspecified: Secondary | ICD-10-CM

## 2018-07-09 DIAGNOSIS — R000 Tachycardia, unspecified: Secondary | ICD-10-CM

## 2018-07-09 NOTE — Progress Notes (Signed)
   Subjective:    Patient ID: Wayne Williams, male    DOB: [birth year], 67 y.o.   MRN: [id number]  HPI Here to go over his recent labs and to follow up low BP with a high heart rate. Per my advice last time he has started to drink a lot more water every day and he feels a little better. Consequently today the BP is up a little and the rate is down a little. As noted his labs were unremarkable.    Review of Systems  Constitutional: Negative.   Respiratory: Negative.   Cardiovascular: Negative.   Neurological: Negative.        Objective:   Physical Exam  Constitutional: He is oriented to person, place, and time. He appears well-developed and well-nourished.  Cardiovascular: Regular rhythm, normal heart sounds and intact distal pulses.  Rapid rate   Pulmonary/Chest: Effort normal and breath sounds normal. No stridor. No respiratory distress. He has no wheezes. He has no rales.  Neurological: He is alert and oriented to person, place, and time.          Assessment & Plan:  He is hypotensive and tachycardic. Drinking mire fluids has helped. We agreed to stop the Lisinopril and he will return to follow up in one month.  Alysia Penna, MD

## 2018-07-12 ENCOUNTER — Other Ambulatory Visit: Payer: Self-pay | Admitting: Family Medicine

## 2018-07-24 ENCOUNTER — Other Ambulatory Visit: Payer: Self-pay | Admitting: Family Medicine

## 2018-08-05 ENCOUNTER — Other Ambulatory Visit: Payer: Self-pay | Admitting: Family Medicine

## 2018-08-09 ENCOUNTER — Encounter: Payer: Self-pay | Admitting: Family Medicine

## 2018-08-09 ENCOUNTER — Ambulatory Visit (INDEPENDENT_AMBULATORY_CARE_PROVIDER_SITE_OTHER): Payer: Medicare HMO | Admitting: Family Medicine

## 2018-08-09 VITALS — BP 124/64 | HR 113 | Temp 98.2°F | Wt 152.8 lb

## 2018-08-09 DIAGNOSIS — I10 Essential (primary) hypertension: Secondary | ICD-10-CM

## 2018-08-09 MED ORDER — METOPROLOL SUCCINATE ER 25 MG PO TB24: 25.0000 mg | ORAL_TABLET | Freq: Every day | ORAL | 3 refills | Status: DC

## 2018-08-09 NOTE — Progress Notes (Signed)
   Subjective:    Patient ID: Wayne Williams, male    DOB: 02/22/1951, 67 y.o.   MRN: [id number]  HPI Here to follow up on BP and tachycardia. One month ago we stopped Lisinopril and his BP did come up to a normal range. He feels better now with more energy. However his heart rate remains high in the 90-110 range.    Review of Systems  Constitutional: Negative.   Respiratory: Negative.   Cardiovascular: Negative.   Neurological: Negative.        Objective:   Physical Exam  Constitutional: He is oriented to person, place, and time. He appears well-developed and well-nourished.  Cardiovascular: Regular rhythm, normal heart sounds and intact distal pulses.  Rapid rate   Pulmonary/Chest: Effort normal and breath sounds normal.  Neurological: He is alert and oriented to person, place, and time.          Assessment & Plan:  His HTN is stable but the heart rate remains high. Try Metoprolol succinate 25 mg daily and recheck in 3-4 weeks.  Alysia Penna, MD

## 2018-08-19 ENCOUNTER — Other Ambulatory Visit: Payer: Self-pay | Admitting: Family Medicine

## 2018-09-13 ENCOUNTER — Ambulatory Visit (INDEPENDENT_AMBULATORY_CARE_PROVIDER_SITE_OTHER): Payer: Medicare HMO | Admitting: Family Medicine

## 2018-09-13 ENCOUNTER — Encounter: Payer: Self-pay | Admitting: Family Medicine

## 2018-09-13 VITALS — BP 120/64 | HR 99 | Temp 97.8°F | Wt 153.1 lb

## 2018-09-13 DIAGNOSIS — R000 Tachycardia, unspecified: Secondary | ICD-10-CM | POA: Diagnosis not present

## 2018-09-13 DIAGNOSIS — I10 Essential (primary) hypertension: Secondary | ICD-10-CM | POA: Diagnosis not present

## 2018-09-13 MED ORDER — METOPROLOL SUCCINATE ER 50 MG PO TB24: 50.0000 mg | ORAL_TABLET | Freq: Every day | ORAL | 5 refills | Status: DC

## 2018-09-13 NOTE — Progress Notes (Signed)
   Subjective:    Patient ID: DIANDRE MERICA, male    DOB: 1951/10/06, 67 y.o.   MRN: [id number]  HPI Here to follow up on HTN and tachycardia. Last month we added Metoprolol succuinate 25 mg daily and this has reduced the pulse rate somewhat without affecting the BP. He feels well. His pulse rate has come down from 113 to 99.    Review of Systems  Constitutional: Negative.   Respiratory: Negative.   Cardiovascular: Negative.   Neurological: Negative.        Objective:   Physical Exam  Constitutional: He is oriented to person, place, and time. He appears well-developed and well-nourished.  Cardiovascular: Regular rhythm, normal heart sounds and intact distal pulses.  Rapid rate   Pulmonary/Chest: Effort normal and breath sounds normal.  Neurological: He is alert and oriented to person, place, and time.          Assessment & Plan:  Tachycardia, we will increase the Metoprolol succinate to 50 mg daily. Recheck in one month.  Alysia Penna, MD

## 2018-09-24 ENCOUNTER — Telehealth: Payer: Self-pay | Admitting: Family Medicine

## 2018-09-24 NOTE — Telephone Encounter (Signed)
Copied from [place] [id number]. Topic: General - Other >> Sep 24, 2018 12:50 PM Keene Breath wrote: Reason for CRM: Patient called to speak with the nurse because he had some questions about his medication.  Please advise.  CB# [phone]

## 2018-09-27 NOTE — Telephone Encounter (Signed)
Pt states he has not heard anythign back.  Wants to know about a med that Dr Sarajane Jews may have taken him off of.  Would appreciate a call back

## 2018-09-27 NOTE — Telephone Encounter (Signed)
Called and spoke with pt and he stated that the pharmacy filled the lisinopril and he stated that Dr. Sarajane Jews had taken him off of this.  He stated that he has already picked this up from the pharmacy and we did review his chart and he has been off of this medication.  He will speak with the pharmacy and advise them that he is no longer taking this medication and for them not to refill this again.

## 2018-10-27 ENCOUNTER — Other Ambulatory Visit: Payer: Self-pay | Admitting: Family Medicine

## 2018-11-01 ENCOUNTER — Other Ambulatory Visit: Payer: Self-pay | Admitting: Family Medicine

## 2018-11-01 NOTE — Telephone Encounter (Signed)
Copied from [place] [id number]. Topic: Quick Communication - Rx Refill/Question >> Nov 01, 2018  9:00 AM Leward Quan A wrote: Medication: metFORMIN (GLUCOPHAGE) 1000 MG tablet   Has the patient contacted their pharmacy? Yes.     Preferred Pharmacy (with phone number or street name): [facility] [place] [address] [id number] (Phone) [phone] (Fax)    Agent: Please be advised that RX refills may take up to 3 business days. We ask that you follow-up with your pharmacy.

## 2018-11-02 MED ORDER — METFORMIN HCL 1000 MG PO TABS: 1000.0000 mg | ORAL_TABLET | Freq: Two times a day (BID) | ORAL | 0 refills | Status: DC

## 2018-11-02 NOTE — Telephone Encounter (Signed)
Requested Prescriptions  Pending Prescriptions Disp Refills  . metFORMIN (GLUCOPHAGE) 1000 MG tablet 180 tablet 0    Sig: Take 1 tablet (1,000 mg total) by mouth 2 (two) times daily.     Endocrinology:  Diabetes - Biguanides Failed - 11/01/2018 10:21 AM      Failed - eGFR in normal range and within 360 days    GFR, Est African American  Date Value Ref Range Status  01/23/2015 88 mL/min Final   GFR, Est Non African American  Date Value Ref Range Status  01/23/2015 76 mL/min Final    Comment:      The estimated GFR is a calculation valid for adults (>=68 years old) that uses the CKD-EPI algorithm to adjust for age and sex. It is   not to be used for children, pregnant women, hospitalized patients,    patients on dialysis, or with rapidly changing kidney function. According to the NKDEP, eGFR >89 is normal, 60-89 shows mild impairment, 30-59 shows moderate impairment, 15-29 shows severe impairment and <15 is ESRD.      GFR  Date Value Ref Range Status  07/05/2018 50.83 (L) >60.00 mL/min Final         Passed - Cr in normal range and within 360 days    Creat  Date Value Ref Range Status  01/23/2015 1.03 0.50 - 1.35 mg/dL Final   Creatinine, Ser  Date Value Ref Range Status  07/05/2018 1.46 0.40 - 1.50 mg/dL Final         Passed - HBA1C is between 0 and 7.9 and within 180 days    Hgb A1c MFr Bld  Date Value Ref Range Status  07/05/2018 6.3 4.6 - 6.5 % Final    Comment:    Glycemic Control Guidelines for People with Diabetes:Non Diabetic:  <6%Goal of Therapy: <7%Additional Action Suggested:  >8%          Passed - Valid encounter within last 6 months    Recent Outpatient Visits          1 month ago Essential hypertension   [facility] [place], Ishmael Holter, MD   2 months ago Essential hypertension   [facility] at [facility], Ishmael Holter, MD   3 months ago Hypotension, unspecified hypotension type   [employer] [facility], Ishmael Holter, MD   4 months ago Essential hypertension   [facility] at [facility], Ishmael Holter, MD   1 year ago Diabetes mellitus without complication [facility])   [place] at [facility], Ishmael Holter, MD

## 2018-11-04 ENCOUNTER — Other Ambulatory Visit: Payer: Self-pay | Admitting: Family Medicine

## 2019-01-06 ENCOUNTER — Other Ambulatory Visit: Payer: Self-pay | Admitting: Family Medicine

## 2019-01-09 ENCOUNTER — Other Ambulatory Visit: Payer: Self-pay | Admitting: Family Medicine

## 2019-01-17 ENCOUNTER — Other Ambulatory Visit: Payer: Self-pay | Admitting: Family Medicine

## 2019-01-18 ENCOUNTER — Other Ambulatory Visit: Payer: Self-pay | Admitting: Family Medicine

## 2019-01-18 NOTE — Telephone Encounter (Signed)
Pt called in to follow up on refill request stating that he is completely out of his Alprazolam. Per Rx notes (directions) pt is due an ov. Scheduled pt to come in tomorrow to see Dr. Sarajane Jews for medication follow up. Pt would like to know if provider could refill?   Please advise.

## 2019-01-18 NOTE — Telephone Encounter (Signed)
Dr. Sarajane Jews please advise if med can be refilled.  Thanks

## 2019-01-18 NOTE — Telephone Encounter (Signed)
Dr. Fry please advise on refill of medication.   

## 2019-01-18 NOTE — Telephone Encounter (Signed)
Pt calling to check status. Please advise  °

## 2019-01-19 ENCOUNTER — Ambulatory Visit (INDEPENDENT_AMBULATORY_CARE_PROVIDER_SITE_OTHER): Payer: Medicare HMO | Admitting: Family Medicine

## 2019-01-19 ENCOUNTER — Encounter: Payer: Self-pay | Admitting: Family Medicine

## 2019-01-19 VITALS — BP 124/72 | HR 83 | Temp 97.5°F | Wt 162.0 lb

## 2019-01-19 DIAGNOSIS — I10 Essential (primary) hypertension: Secondary | ICD-10-CM

## 2019-01-19 DIAGNOSIS — E119 Type 2 diabetes mellitus without complications: Secondary | ICD-10-CM | POA: Diagnosis not present

## 2019-01-19 LAB — HEMOGLOBIN A1C: Hgb A1c MFr Bld: 6.2 % (ref 4.6–6.5)

## 2019-01-19 MED ORDER — METOPROLOL SUCCINATE ER 50 MG PO TB24: 50.0000 mg | ORAL_TABLET | Freq: Every day | ORAL | 3 refills | Status: DC

## 2019-01-19 MED ORDER — CLOPIDOGREL BISULFATE 75 MG PO TABS: 75.0000 mg | ORAL_TABLET | Freq: Every day | ORAL | 3 refills | Status: DC

## 2019-01-19 MED ORDER — METFORMIN HCL 1000 MG PO TABS: 1000.0000 mg | ORAL_TABLET | Freq: Two times a day (BID) | ORAL | 3 refills | Status: DC

## 2019-01-19 MED ORDER — ALPRAZOLAM 0.5 MG PO TABS: 0.5000 mg | ORAL_TABLET | Freq: Four times a day (QID) | ORAL | 5 refills | Status: DC | PRN

## 2019-01-19 NOTE — Telephone Encounter (Signed)
Call in #[phone] with 5 rf 

## 2019-01-19 NOTE — Progress Notes (Signed)
   Subjective:    Patient ID: Wayne Williams, male    DOB: [birth year], 68 y.o.   MRN: [id number]  HPI Here to follow up. He feels great and he has gained a little weight. His BP is stable. He has not checked a glucose for some time.    Review of Systems  Constitutional: Negative.   Respiratory: Negative.   Cardiovascular: Negative.   Neurological: Negative.        Objective:   Physical Exam Constitutional:      Appearance: Normal appearance.  Cardiovascular:     Rate and Rhythm: Normal rate and regular rhythm.     Pulses: Normal pulses.     Heart sounds: Normal heart sounds.  Pulmonary:     Effort: Pulmonary effort is normal.     Breath sounds: Normal breath sounds.  Neurological:     General: No focal deficit present.     Mental Status: He is alert and oriented to person, place, and time.           Assessment & Plan:  His HTN and anxiety are stable. We will check an A1c today for the diabetes. His medications are refilled.  Alysia Penna, MD

## 2019-01-27 ENCOUNTER — Telehealth: Payer: Self-pay | Admitting: Family Medicine

## 2019-01-27 NOTE — Telephone Encounter (Signed)
Copied from [place] [id number]. Topic: General - Inquiry >> Jan 27, 2019 11:59 AM Margot Ables wrote: Reason for CRM: pt called requesting results from recent A1C. Please call back.

## 2019-02-07 NOTE — Telephone Encounter (Signed)
See results note.  I spoke with the pt on 1/31 and he is aware of results.

## 2019-02-17 ENCOUNTER — Ambulatory Visit: Payer: Self-pay

## 2019-02-17 NOTE — Telephone Encounter (Signed)
Pt. Reports he started coughing 2 weeks ago, non-productive cough. Cough keeps him up at night. Has tried Mucinex, Claritin and cough drops. Does not want an appointment today  due to weather.Requesting cough medication he has had in the past be sent to his pharmacy. Please advise pt.  Answer Assessment - Initial Assessment Questions 1. ONSET: "When did the cough begin?"      2 weeks ago 2. SEVERITY: "How bad is the cough today?"      Severe 3. RESPIRATORY DISTRESS: "Describe your breathing."      Shortness of breath 4. FEVER: "Do you have a fever?" If so, ask: "What is your temperature, how was it measured, and when did it start?"     No 5. HEMOPTYSIS: "Are you coughing up any blood?" If so ask: "How much?" (flecks, streaks, tablespoons, etc.)     No 6. TREATMENT: "What have you done so far to treat the cough?" (e.g., meds, fluids, humidifier)     Mucinex, cough drops, Claritin 7. CARDIAC HISTORY: "Do you have any history of heart disease?" (e.g., heart attack, congestive heart failure)      Yes 8. LUNG HISTORY: "Do you have any history of lung disease?"  (e.g., pulmonary embolus, asthma, emphysema)     COPD 9. PE RISK FACTORS: "Do you have a history of blood clots?" (or: recent major surgery, recent prolonged travel, bedridden)     No 10. OTHER SYMPTOMS: "Do you have any other symptoms? (e.g., runny nose, wheezing, chest pain)       Wheezing/rattling 11. PREGNANCY: "Is there any chance you are pregnant?" "When was your last menstrual period?"       n/a 12. TRAVEL: "Have you traveled out of the country in the last month?" (e.g., travel history, exposures)       No  Protocols used: COUGH - ACUTE NON-PRODUCTIVE-A-AH

## 2019-02-18 ENCOUNTER — Telehealth: Payer: Self-pay | Admitting: Family Medicine

## 2019-02-18 MED ORDER — HYDROCODONE-HOMATROPINE 5-1.5 MG/5ML PO SYRP: 5.0000 mL | ORAL_SOLUTION | ORAL | 0 refills | Status: DC | PRN

## 2019-02-18 NOTE — Telephone Encounter (Signed)
Dr. Fry please advise. Thanks  

## 2019-02-18 NOTE — Telephone Encounter (Signed)
Copied from [place] [id number]. Topic: Quick Communication - See Telephone Encounter >> Feb 18, 2019  6:12 PM Rutherford Nail, [place] wrote: CRM for notification. See Telephone encounter for: 02/18/19. See Nurse Triage Encounter from 02/17/2019.   Patient calling and states that the pharmacy did not receive the HYDROcodone-homatropine (HYDROMET) 5-1.5 MG/5ML syrup that was sent in today 02/18/2001. Confirmed with with pharmacy that they did not receive that order. Please advise.

## 2019-02-18 NOTE — Addendum Note (Signed)
Addended by: Alysia Penna A on: 02/18/2019 01:12 PM   Modules accepted: Orders

## 2019-02-18 NOTE — Telephone Encounter (Signed)
I sent in some Hydromet

## 2019-02-18 NOTE — Telephone Encounter (Signed)
Called and spoke with pt and he is aware of rx has been sent to the pharmacy.

## 2019-02-21 MED ORDER — HYDROCODONE-HOMATROPINE 5-1.5 MG/5ML PO SYRP: 5.0000 mL | ORAL_SOLUTION | ORAL | 0 refills | Status: DC | PRN

## 2019-02-21 NOTE — Telephone Encounter (Signed)
I sent it in again 

## 2019-02-21 NOTE — Telephone Encounter (Signed)
Dr. Fry please advise. Thanks  

## 2019-03-15 ENCOUNTER — Ambulatory Visit: Payer: Self-pay | Admitting: *Deleted

## 2019-03-15 NOTE — Telephone Encounter (Signed)
Summary: Requesting to speak with nurse/coronavirus questions    Patient calling requesting to speak with a nurse about coronavirus. When asked if I could help him with something, he responded "are you a nurse? Then no" and stated he would like a call back from nurse, as he has questions. Please advise?     Patient has questions about his daughter - she is having an elective surgery and he is concerned that she should not be having it at this time. Spoke with patient- told him I understood his concern- he can only express his concern for her well being - but ultimately it is her decision.  Things are ever evolving- the hospital my change things- and the surgery may not be an option in the end.

## 2019-03-28 ENCOUNTER — Telehealth: Payer: Self-pay

## 2019-03-28 NOTE — Telephone Encounter (Signed)
Author phoned pt. to assess interest in scheduling virtual awv. Pt. Stated he does not have a smart phone and does not want to do virtual visit with his computer. Author offered to do later in the year in office, but pt. Declined "I'm doing fine". Author reviewed pt's care gaps and explained nature of awv more but pt. Still stated he was not interested.

## 2019-07-12 ENCOUNTER — Other Ambulatory Visit: Payer: Self-pay | Admitting: Family Medicine

## 2019-07-13 NOTE — Telephone Encounter (Signed)
Patient called to check the status of his refill on ALPRAZolam (XANAX) 0.5 MG tablet  States that he need it right away

## 2019-07-14 NOTE — Telephone Encounter (Signed)
Pt is checking on status on refill. He got a text from pharmacy that says it was declined Please call back [phone]

## 2019-07-15 NOTE — Telephone Encounter (Signed)
Done

## 2019-07-18 ENCOUNTER — Ambulatory Visit (INDEPENDENT_AMBULATORY_CARE_PROVIDER_SITE_OTHER): Payer: Medicare HMO | Admitting: Family Medicine

## 2019-07-18 ENCOUNTER — Encounter: Payer: Self-pay | Admitting: Family Medicine

## 2019-07-18 ENCOUNTER — Other Ambulatory Visit: Payer: Self-pay

## 2019-07-18 DIAGNOSIS — F411 Generalized anxiety disorder: Secondary | ICD-10-CM

## 2019-07-18 DIAGNOSIS — I10 Essential (primary) hypertension: Secondary | ICD-10-CM | POA: Diagnosis not present

## 2019-07-18 DIAGNOSIS — E119 Type 2 diabetes mellitus without complications: Secondary | ICD-10-CM | POA: Diagnosis not present

## 2019-07-18 MED ORDER — SIMVASTATIN 40 MG PO TABS: ORAL_TABLET | ORAL | 3 refills | Status: DC

## 2019-07-18 NOTE — Progress Notes (Signed)
   Subjective:    Patient ID: Wayne Williams, male    DOB: 1951/07/22, 68 y.o.   MRN: [id number]  HPI Virtual Visit via Telephone Note  I connected with the patient on 07/18/19 at  2:00 PM EDT by telephone and verified that I am speaking with the correct person using two identifiers. We attempted to connect virtually but we had technical difficulties with the audio and video.     I discussed the limitations, risks, security and privacy concerns of performing an evaluation and management service by telephone and the availability of in person appointments. I also discussed with the patient that there may be a patient responsible charge related to this service. The patient expressed understanding and agreed to proceed.  Location patient: home Location provider: work or home office Participants present for the call: patient, provider Patient did not have a visit in the prior 7 days to address this/these issue(s).   History of Present Illness: Here to follow up on anxiety, HTN, and DM. He feels well and has no concerns. His BP and glucoses have been stable at home.    Observations/Objective: Patient sounds cheerful and well on the phone. I do not appreciate any SOB. Speech and thought processing are grossly intact. Patient reported vitals:  Assessment and Plan: Stable anxiety, HTN, and diabetes. We will have him come on for labs soon.  Alysia Penna, MD   Follow Up Instructions:     [phone] 5-10 [phone] 11-20 [phone] 21-30 I did not refer this patient for an OV in the next 24 hours for this/these issue(s).  I discussed the assessment and treatment plan with the patient. The patient was provided an opportunity to ask questions and all were answered. The patient agreed with the plan and demonstrated an understanding of the instructions.   The patient was advised to call back or seek an in-person evaluation if the symptoms worsen or if the condition fails to improve as anticipated.  I  provided 13 minutes of non-face-to-face time during this encounter.   Alysia Penna, MD    Review of Systems     Objective:   Physical Exam        Assessment & Plan:

## 2019-10-12 ENCOUNTER — Other Ambulatory Visit: Payer: Self-pay | Admitting: Family Medicine

## 2019-11-10 ENCOUNTER — Other Ambulatory Visit: Payer: Self-pay | Admitting: Family Medicine

## 2019-11-23 ENCOUNTER — Telehealth: Payer: Self-pay | Admitting: Family Medicine

## 2019-11-23 NOTE — Telephone Encounter (Signed)
Pt called stating he only has 3 pills left of his simvastatin. Pt states he contacted pharmacy and they state he should have enough of the medication left. Pt states he does pour the medication into other bottles and the pharmacy states he may have poured them into an old bottle. Please advise.     [facility], [place] [address]  Phone: [phone] Fax: [phone]  Not a 24 hour pharmacy; exact hours not known.

## 2019-11-23 NOTE — Telephone Encounter (Signed)
Please see encounter below

## 2019-12-19 ENCOUNTER — Telehealth: Payer: Self-pay | Admitting: Family Medicine

## 2019-12-19 NOTE — Telephone Encounter (Signed)
Please advise 

## 2019-12-19 NOTE — Telephone Encounter (Signed)
Message Routed to PCP CMA 

## 2019-12-19 NOTE — Telephone Encounter (Signed)
Patient has questions on if he should take the covid vaccine or not, when it becomes available. Would like a call back when available

## 2019-12-20 NOTE — Telephone Encounter (Signed)
Yes EVERYONE should take the vaccine

## 2019-12-20 NOTE — Telephone Encounter (Signed)
Patient is aware 

## 2019-12-22 ENCOUNTER — Other Ambulatory Visit: Payer: Self-pay | Admitting: Family Medicine

## 2020-01-06 ENCOUNTER — Other Ambulatory Visit: Payer: Self-pay | Admitting: Family Medicine

## 2020-01-06 NOTE — Telephone Encounter (Signed)
Last fill 07/15/2019 Last OV 07/18/2019  Ok to fill?

## 2020-02-23 ENCOUNTER — Ambulatory Visit: Payer: Medicare HMO | Attending: Internal Medicine

## 2020-02-23 DIAGNOSIS — Z23 Encounter for immunization: Secondary | ICD-10-CM | POA: Insufficient documentation

## 2020-02-23 NOTE — Progress Notes (Signed)
   Covid-19 Vaccination Clinic  Name:  Wayne Williams    MRN: [id number] DOB: 1951-07-04  02/23/2020  [name] was observed post Covid-19 immunization for 15 minutes without incidence. He was provided with Vaccine Information Sheet and instruction to access the V-Safe system.   [name] was instructed to call 911 with any severe reactions post vaccine: Marland Kitchen Difficulty breathing  . Swelling of your face and throat  . A fast heartbeat  . A bad rash all over your body  . Dizziness and weakness    Immunizations Administered    Name Date Dose VIS Date Route   Pfizer COVID-19 Vaccine 02/23/2020  1:51 PM 0.3 mL 12/09/2019 Intramuscular   Manufacturer: [place]   Lot: [id number]   [place]: [id number]

## 2020-03-12 ENCOUNTER — Telehealth: Payer: Self-pay | Admitting: Family Medicine

## 2020-03-12 NOTE — Telephone Encounter (Signed)
Called and spoke with pt and he states he will call back to make an appointment to f/u on his diabetes and a1c.

## 2020-03-12 NOTE — Telephone Encounter (Signed)
Pt call and want to know what dr Sarajane Jews think about him coming in the office for lab and office visit he state that he is skeptical about come in and want advice from dr.Fry also want a call back.

## 2020-03-20 ENCOUNTER — Ambulatory Visit: Payer: Medicare HMO | Attending: Internal Medicine

## 2020-03-20 DIAGNOSIS — Z23 Encounter for immunization: Secondary | ICD-10-CM

## 2020-03-20 NOTE — Progress Notes (Signed)
   Covid-19 Vaccination Clinic  Name:  Wayne Williams    MRN: [id number] DOB: 1951-06-13  03/20/2020  [name] was observed post Covid-19 immunization for 15 minutes without incident. He was provided with Vaccine Information Sheet and instruction to access the V-Safe system.   [name] was instructed to call 911 with any severe reactions post vaccine: Marland Kitchen Difficulty breathing  . Swelling of face and throat  . A fast heartbeat  . A bad rash all over body  . Dizziness and weakness   Immunizations Administered    Name Date Dose VIS Date Route   Pfizer COVID-19 Vaccine 03/20/2020  1:04 PM 0.3 mL 12/09/2019 Intramuscular   Manufacturer: [place]   Lot: [id number]   [place]: [id number]

## 2020-03-27 ENCOUNTER — Other Ambulatory Visit: Payer: Self-pay

## 2020-03-28 ENCOUNTER — Ambulatory Visit (INDEPENDENT_AMBULATORY_CARE_PROVIDER_SITE_OTHER): Payer: Medicare HMO

## 2020-03-28 ENCOUNTER — Encounter: Payer: Self-pay | Admitting: Family Medicine

## 2020-03-28 ENCOUNTER — Ambulatory Visit (INDEPENDENT_AMBULATORY_CARE_PROVIDER_SITE_OTHER): Payer: Medicare HMO | Admitting: Family Medicine

## 2020-03-28 VITALS — BP 120/64 | HR 65 | Temp 97.7°F | Wt 153.2 lb

## 2020-03-28 DIAGNOSIS — J449 Chronic obstructive pulmonary disease, unspecified: Secondary | ICD-10-CM

## 2020-03-28 DIAGNOSIS — N401 Enlarged prostate with lower urinary tract symptoms: Secondary | ICD-10-CM

## 2020-03-28 DIAGNOSIS — N138 Other obstructive and reflux uropathy: Secondary | ICD-10-CM

## 2020-03-28 DIAGNOSIS — L989 Disorder of the skin and subcutaneous tissue, unspecified: Secondary | ICD-10-CM | POA: Diagnosis not present

## 2020-03-28 DIAGNOSIS — I10 Essential (primary) hypertension: Secondary | ICD-10-CM | POA: Diagnosis not present

## 2020-03-28 DIAGNOSIS — E119 Type 2 diabetes mellitus without complications: Secondary | ICD-10-CM | POA: Diagnosis not present

## 2020-03-28 DIAGNOSIS — E785 Hyperlipidemia, unspecified: Secondary | ICD-10-CM | POA: Diagnosis not present

## 2020-03-28 DIAGNOSIS — R05 Cough: Secondary | ICD-10-CM | POA: Diagnosis not present

## 2020-03-28 LAB — CBC WITH DIFFERENTIAL/PLATELET
Basophils Absolute: 0.1 10*3/uL (ref 0.0–0.1)
Basophils Relative: 0.7 % (ref 0.0–3.0)
Eosinophils Absolute: 0.3 10*3/uL (ref 0.0–0.7)
Eosinophils Relative: 3.1 % (ref 0.0–5.0)
HCT: 41 % (ref 39.0–52.0)
Hemoglobin: 13.8 g/dL (ref 13.0–17.0)
Lymphocytes Relative: 27.5 % (ref 12.0–46.0)
Lymphs Abs: 2.3 10*3/uL (ref 0.7–4.0)
MCHC: 33.8 g/dL (ref 30.0–36.0)
MCV: 92.5 fl (ref 78.0–100.0)
Monocytes Absolute: 0.8 10*3/uL (ref 0.1–1.0)
Monocytes Relative: 9.2 % (ref 3.0–12.0)
Neutro Abs: 5 10*3/uL (ref 1.4–7.7)
Neutrophils Relative %: 59.5 % (ref 43.0–77.0)
Platelets: 300 10*3/uL (ref 150.0–400.0)
RBC: 4.43 Mil/uL (ref 4.22–5.81)
RDW: 13.9 % (ref 11.5–15.5)
WBC: 8.4 10*3/uL (ref 4.0–10.5)

## 2020-03-28 LAB — BASIC METABOLIC PANEL
BUN: 19 mg/dL (ref 6–23)
CO2: 30 mEq/L (ref 19–32)
Calcium: 9.6 mg/dL (ref 8.4–10.5)
Chloride: 102 mEq/L (ref 96–112)
Creatinine, Ser: 1.17 mg/dL (ref 0.40–1.50)
GFR: 61.44 mL/min (ref >60.00)
Glucose, Bld: 83 mg/dL (ref 70–99)
Potassium: 4.9 mEq/L (ref 3.5–5.1)
Sodium: 138 mEq/L (ref 135–145)

## 2020-03-28 LAB — LIPID PANEL
Cholesterol: 124 mg/dL (ref 0–200)
HDL: 40.4 mg/dL (ref >39.00)
LDL Cholesterol: 62 mg/dL (ref 0–99)
NonHDL: 83.19
Total CHOL/HDL Ratio: 3
Triglycerides: 106 mg/dL (ref 0.0–149.0)
VLDL: 21.2 mg/dL (ref 0.0–40.0)

## 2020-03-28 LAB — HEMOGLOBIN A1C: Hgb A1c MFr Bld: 6.1 % (ref 4.6–6.5)

## 2020-03-28 LAB — HEPATIC FUNCTION PANEL
ALT: 14 U/L (ref 0–53)
AST: 19 U/L (ref 0–37)
Albumin: 4.2 g/dL (ref 3.5–5.2)
Alkaline Phosphatase: 59 U/L (ref 39–117)
Bilirubin, Direct: 0.1 mg/dL (ref 0.0–0.3)
Total Bilirubin: 0.5 mg/dL (ref 0.2–1.2)
Total Protein: 7 g/dL (ref 6.0–8.3)

## 2020-03-28 LAB — PSA: PSA: 1.65 ng/mL (ref 0.10–4.00)

## 2020-03-28 LAB — TSH: TSH: 1.05 u[IU]/mL (ref 0.35–4.50)

## 2020-03-28 NOTE — Progress Notes (Signed)
   Subjective:    Patient ID: Wayne Williams, male    DOB: [birth year], 69 y.o.   MRN: [id number]  HPI Here for several issues. First he has had a chronic cough for the past year. This is usually non-productive. No chest pain, but he does get SOB easily. He still smokes daily. No fevers. Also for the past year he has felt very fatigued and weak. No weight changes. His appetite has always been poor. Finally for the past 6 months he has had a spot on his back that itches and burns, and he has scratched it off numerous times with some bleeding.    Review of Systems  Constitutional: Positive for fatigue. Negative for chills, diaphoresis, fever and unexpected weight change.  Respiratory: Positive for cough, shortness of breath and wheezing.   Cardiovascular: Negative.   Gastrointestinal: Negative.   Genitourinary: Negative.   Skin: Positive for wound.       Objective:   Physical Exam Constitutional:      Comments: Frail, thin   Cardiovascular:     Rate and Rhythm: Normal rate and regular rhythm.     Pulses: Normal pulses.     Heart sounds: Normal heart sounds.  Pulmonary:     Effort: Pulmonary effort is normal.     Breath sounds: No rales.     Comments: His breath sounds are very quiet throughout with diffuse wheezes and rhonchi  Abdominal:     General: Abdomen is flat. Bowel sounds are normal. There is no distension.     Palpations: Abdomen is soft. There is no mass.     Tenderness: There is no abdominal tenderness. There is no guarding or rebound.     Hernia: No hernia is present.  Skin:    Comments: The upper right back has a 1 cm lesion that has a raised pearly border and a central ullcerated area with a scab on it   Neurological:     Mental Status: He is alert.           Assessment & Plan:  For the fatigue, we will get labs today to check for diabetes, thyroid disorders, anemia, etc. He certainly has COPD from smoking and we will get a CXR today to look for any other  problems. I urged him to quit once again. The lesion on his back is likely a basal cell cancer, so we will arrange for this to be excised.  Alysia Penna, MD

## 2020-03-28 NOTE — Addendum Note (Signed)
Addended by: Suzette Battiest on: 03/28/2020 02:15 PM   Modules accepted: Orders

## 2020-04-05 ENCOUNTER — Telehealth: Payer: Self-pay | Admitting: Family Medicine

## 2020-04-05 NOTE — Telephone Encounter (Signed)
Pt calling to get results he said that he did get any messages about his results. He is having trouble with his phone so if he does not answer could we try him back again.

## 2020-04-06 NOTE — Telephone Encounter (Signed)
Left message for patient to call back  

## 2020-04-09 ENCOUNTER — Telehealth (INDEPENDENT_AMBULATORY_CARE_PROVIDER_SITE_OTHER): Payer: Medicare HMO | Admitting: Family Medicine

## 2020-04-09 ENCOUNTER — Other Ambulatory Visit: Payer: Self-pay

## 2020-04-09 DIAGNOSIS — J449 Chronic obstructive pulmonary disease, unspecified: Secondary | ICD-10-CM

## 2020-04-09 MED ORDER — ALBUTEROL SULFATE HFA 108 (90 BASE) MCG/ACT IN AERS: 2.0000 | INHALATION_SPRAY | RESPIRATORY_TRACT | 11 refills | Status: DC | PRN

## 2020-04-09 MED ORDER — ADVAIR HFA 230-21 MCG/ACT IN AERO: 2.0000 | INHALATION_SPRAY | Freq: Two times a day (BID) | RESPIRATORY_TRACT | 11 refills | Status: DC

## 2020-04-09 NOTE — Progress Notes (Signed)
Virtual Visit via Telephone Note  I connected with the patient on 04/09/20 at  1:00 PM EDT by telephone and verified that I am speaking with the correct person using two identifiers.   I discussed the limitations, risks, security and privacy concerns of performing an evaluation and management service by telephone and the availability of in person appointments. I also discussed with the patient that there may be a patient responsible charge related to this service. The patient expressed understanding and agreed to proceed.  Location patient: home Location provider: work or home office Participants present for the call: patient, provider Patient did not have a visit in the prior 7 days to address this/these issue(s).   History of Present Illness: Here asking if he can try an inhaler. We saw him recently for SOB and a CXR confirmed he has COPD. His oxygen sat that day was 97%. He says he gets very SOB by walking short distances such as going out to his mailbox. No chest pain.    Observations/Objective: Patient sounds cheerful and well on the phone. I do not appreciate any SOB. Speech and thought processing are grossly intact. Patient reported vitals:  Assessment and Plan: COPD. He will try a maintenance inhaler with Advair HFA BID, and also Ventolin as a rescue inhaler. Recheck prn.  Alysia Penna, MD   Follow Up Instructions:     [phone] 5-10 [phone] 11-20 [phone] 21-30 I did not refer this patient for an OV in the next 24 hours for this/these issue(s).  I discussed the assessment and treatment plan with the patient. The patient was provided an opportunity to ask questions and all were answered. The patient agreed with the plan and demonstrated an understanding of the instructions.   The patient was advised to call back or seek an in-person evaluation if the symptoms worsen or if the condition fails to improve as anticipated.  I provided 12 minutes of non-face-to-face time during this  encounter.   Alysia Penna, MD

## 2020-04-13 NOTE — Telephone Encounter (Signed)
See result note for further documentation.

## 2020-04-30 DIAGNOSIS — C44519 Basal cell carcinoma of skin of other part of trunk: Secondary | ICD-10-CM | POA: Diagnosis not present

## 2020-04-30 DIAGNOSIS — L905 Scar conditions and fibrosis of skin: Secondary | ICD-10-CM | POA: Diagnosis not present

## 2020-04-30 DIAGNOSIS — Z85828 Personal history of other malignant neoplasm of skin: Secondary | ICD-10-CM | POA: Diagnosis not present

## 2020-06-01 ENCOUNTER — Other Ambulatory Visit: Payer: Self-pay | Admitting: Family Medicine

## 2020-06-09 ENCOUNTER — Other Ambulatory Visit: Payer: Self-pay | Admitting: Family Medicine

## 2020-06-19 ENCOUNTER — Other Ambulatory Visit: Payer: Self-pay | Admitting: *Deleted

## 2020-06-19 MED ORDER — TRIAMCINOLONE ACETONIDE 0.1 % EX CREA: 1.0000 "application " | TOPICAL_CREAM | Freq: Two times a day (BID) | CUTANEOUS | 5 refills | Status: DC | PRN

## 2020-06-19 NOTE — Telephone Encounter (Signed)
Please advise. Last filled in 2018

## 2020-06-19 NOTE — Telephone Encounter (Signed)
Patient called the after hours line. Patient reports he has a prescription he needs refilled . He states he is down to half a tube and he needs some more. Traimcinolone [facility] [address] [facility] [employer]

## 2020-06-22 ENCOUNTER — Other Ambulatory Visit: Payer: Self-pay

## 2020-06-22 MED ORDER — TRIAMCINOLONE ACETONIDE 0.1 % EX CREA: 1.0000 "application " | TOPICAL_CREAM | Freq: Two times a day (BID) | CUTANEOUS | 5 refills | Status: DC | PRN

## 2020-06-22 NOTE — Telephone Encounter (Signed)
Patient called in. His Kenalog cream was sent to the wrong pharmacy. This has been re-sent to the correct pharmacy. The patient is aware.

## 2020-07-06 ENCOUNTER — Other Ambulatory Visit: Payer: Self-pay | Admitting: Family Medicine

## 2020-07-09 NOTE — Telephone Encounter (Signed)
Last filled 01/09/2020 Last OV 04/09/2020  Ok to fill?

## 2020-08-01 ENCOUNTER — Ambulatory Visit (INDEPENDENT_AMBULATORY_CARE_PROVIDER_SITE_OTHER): Payer: Medicare PPO

## 2020-08-01 DIAGNOSIS — Z0000 Encounter for general adult medical examination without abnormal findings: Secondary | ICD-10-CM | POA: Diagnosis not present

## 2020-08-01 NOTE — Progress Notes (Signed)
Virtual Visit via Telephone Note  I connected with  Wayne Williams on 08/01/20 at 11:00 AM EDT by telephone and verified that I am speaking with the correct person using two identifiers.  Medicare Annual Wellness visit completed telephonically due to Covid-19 pandemic.   Persons participating in this call: This Health Coach and this patient.   Location: Patient: Home Provider: Office   I discussed the limitations, risks, security and privacy concerns of performing an evaluation and management service by telephone and the availability of in person appointments. The patient expressed understanding and agreed to proceed.  Unable to perform video visit due to video visit attempted and failed and/or patient does not have video capability.   Some vital signs may be absent or patient reported.   Wayne Brace, LPN    Subjective:   Wayne Williams is a 69 y.o. male who presents for an Initial Medicare Annual Wellness Visit.  Review of Systems           Objective:    There were no vitals filed for this visit. There is no height or weight on file to calculate BMI.  Advanced Directives 06/12/2017 01/24/2015  Does Patient Have a Medical Advance Directive? No No  Would patient like information on creating a medical advance directive? No - Patient declined Yes - Educational materials given    Current Medications (verified) Outpatient Encounter Medications as of 08/01/2020  Medication Sig  . albuterol (VENTOLIN HFA) 108 (90 Base) MCG/ACT inhaler Inhale 2 puffs into the lungs every 4 (four) hours as needed for wheezing or shortness of breath.  . ALPRAZolam (XANAX) 0.5 MG tablet TAKE 1 TABLET BY MOUTH EVERY 6 (SIX) HOURS AS NEEDED FOR ANXIETY.  Marland Kitchen aspirin 81 MG tablet Take 81 mg by mouth daily.    . clopidogrel (PLAVIX) 75 MG tablet TAKE 1 TABLET BY MOUTH EVERY DAY  . fluticasone-salmeterol (ADVAIR HFA) 230-21 MCG/ACT inhaler Inhale 2 puffs into the lungs 2 (two) times daily.  . metFORMIN  (GLUCOPHAGE) 1000 MG tablet TAKE 1 TABLET BY MOUTH TWICE A DAY  . metoprolol succinate (TOPROL-XL) 50 MG 24 hr tablet TAKE 1 TABLET BY MOUTH DAILY WITH OR IMMEDIATELY FOLLOWING A MEAL.  . nitroGLYCERIN (NITROSTAT) 0.4 MG SL tablet Place 1 tablet (0.4 mg total) under the tongue every 5 (five) minutes as needed for chest pain.  . simvastatin (ZOCOR) 40 MG tablet TAKE 1 TABLET BY MOUTH EVERYDAY AT BEDTIME  . tamsulosin (FLOMAX) 0.4 MG CAPS capsule TAKE 1 CAPSULE BY MOUTH EVERY DAY  . triamcinolone cream (KENALOG) 0.1 % Apply 1 application topically 2 (two) times daily as needed.  . [DISCONTINUED] HYDROcodone-homatropine (HYDROMET) 5-1.5 MG/5ML syrup Take 5 mLs by mouth every 4 (four) hours as needed. (Patient not taking: Reported on 08/01/2020)   No facility-administered encounter medications on file as of 08/01/2020.    Allergies (verified) Patient has no known allergies.   History: Past Medical History:  Diagnosis Date  . Anxiety   . CAD (coronary artery disease)    s/p cabg x 4  . Cerebrovascular accident ([place])    hx of 06 20 09  . Depression    saw Dr. Casimiro Needle in the past  . Diabetes mellitus   . ED (erectile dysfunction)   . GERD (gastroesophageal reflux disease)   . Hypertension   . Insomnia    Past Surgical History:  Procedure Laterality Date  . CERVICAL LAMINECTOMY  1996   per Dr. Hal Neer  . Heart bypass  2001  . LEFT HEART CATHETERIZATION WITH CORONARY/GRAFT ANGIOGRAM N/A 01/24/2015   Procedure: LEFT HEART CATHETERIZATION WITH Beatrix Fetters;  Surgeon: Burnell Blanks, MD;  Location[facility] CATH LAB;  Service: Cardiovascular;  Laterality: N/A;  . [place]   per Dr. Gladstone Lighter   Family History  Problem Relation Age of Onset  . Coronary artery disease Other   . Hypertension Other    Social History   Socioeconomic History  . Marital status: Divorced    Spouse name: Not on file  . Number of children: Not on file  . Years of education: Not on  file  . Highest education level: Not on file  Occupational History  . Occupation: Retired   Tobacco Use  . Smoking status: Current Every Day Smoker    Packs/day: 1.00    Years: 50.00    Pack years: 50.00    Types: Cigarettes  . Smokeless tobacco: Never Used  Substance and Sexual Activity  . Alcohol use: No    Alcohol/week: 0.0 standard drinks  . Drug use: No  . Sexual activity: Not on file  Other Topics Concern  . Not on file  Social History Narrative   Disabled   Divorced, lives alone         Social Determinants of Health   Financial Resource Strain:   . Difficulty of Paying Living Expenses:   Food Insecurity:   . Worried About Charity fundraiser in the Last Year:   . Arboriculturist in the Last Year:   Transportation Needs:   . Film/video editor (Medical):   Marland Kitchen Lack of Transportation (Non-Medical):   Physical Activity: Inactive  . Days of Exercise per Week: 0 days  . Minutes of Exercise per Session: 0 min  Stress:   . Feeling of Stress :   Social Connections:   . Frequency of Communication with Friends and Family:   . Frequency of Social Gatherings with Friends and Family:   . Attends Religious Services:   . Active Member of Clubs or Organizations:   . Attends Archivist Meetings:   Marland Kitchen Marital Status:     Tobacco Counseling Ready to quit: Not Answered Counseling given: Not Answered   Clinical Intake:  Pre-visit preparation completed: Yes        BMI - recorded: 21.98 Nutritional Status: BMI of 19-24  Normal Diabetes: Yes CBG done?: No Did pt. bring in CBG monitor from home?: No  How often do you need to have someone help you when you read instructions, pamphlets, or other written materials from your doctor or pharmacy?: 1 - Never  Diabetic?Yes  Interpreter Needed?: No  Information entered by :: Dayton Martes   Activities of Daily Living No flowsheet data found.  Patient Care Team: Laurey Morale, MD as PCP -  General  Indicate any recent Medical Services you may have received from other than [facility] providers in the past year (date may be approximate).     Assessment:   This is a routine wellness examination for Wayne Williams.  Hearing/Vision screen No exam data present  Dietary issues and exercise activities discussed:    Goals    . Patient Stated     None at this time      Depression Screen PHQ 2/9 Scores 08/01/2020 09/13/2018  PHQ - 2 Score 0 0    Fall Risk Fall Risk  09/13/2018  Falls in the past year? No    Any stairs in or  around the home? Yes  If so, are there any without handrails? No  Home free of loose throw rugs in walkways, pet beds, electrical cords, etc? Yes  Adequate lighting in your home to reduce risk of falls? Yes   ASSISTIVE DEVICES UTILIZED TO PREVENT FALLS:  Life alert? No  Use of a cane, walker or w/c? Yes  Grab bars in the bathroom? No  Shower chair or bench in shower? No  Elevated toilet seat or a handicapped toilet? No   TIMED UP AND GO:  Was the test performed? No .   Cognitive Function:        Immunizations Immunization History  Administered Date(s) Administered  . Influenza, High Dose Seasonal PF 09/17/2018  . PFIZER SARS-COV-2 Vaccination 02/23/2020, 03/20/2020    TDAP status: Due, Education has been provided regarding the importance of this vaccine. Advised may receive this vaccine at local pharmacy or Health Dept. Aware to provide a copy of the vaccination record if obtained from local pharmacy or Health Dept. Verbalized acceptance and understanding. Flu Vaccine status: Declined, Education has been provided regarding the importance of this vaccine but patient still declined. Advised may receive this vaccine at local pharmacy or Health Dept. Aware to provide a copy of the vaccination record if obtained from local pharmacy or Health Dept. Verbalized acceptance and understanding. Pneumococcal vaccine status: Declined,  Education has been provided  regarding the importance of this vaccine but patient still declined. Advised may receive this vaccine at local pharmacy or Health Dept. Aware to provide a copy of the vaccination record if obtained from local pharmacy or Health Dept. Verbalized acceptance and understanding.  Covid-19 vaccine status: Completed vaccines  Qualifies for Shingles Vaccine? Yes   Zostavax completed No   Shingrix Completed?: No.    Education has been provided regarding the importance of this vaccine. Patient has been advised to call insurance company to determine out of pocket expense if they have not yet received this vaccine. Advised may also receive vaccine at local pharmacy or Health Dept. Verbalized acceptance and understanding.  Screening Tests Health Maintenance  Topic Date Due  . Hepatitis C Screening  Never done  . FOOT EXAM  Never done  . OPHTHALMOLOGY EXAM  Never done  . TETANUS/TDAP  Never done  . PNA vac Low Risk Adult (1 of 2 - PCV13) Never done  . URINE MICROALBUMIN  11/26/2016  . INFLUENZA VACCINE  07/29/2020  . COLONOSCOPY  09/04/2020  . HEMOGLOBIN A1C  09/27/2020  . COVID-19 Vaccine  Completed    Health Maintenance  Health Maintenance Due  Topic Date Due  . Hepatitis C Screening  Never done  . FOOT EXAM  Never done  . OPHTHALMOLOGY EXAM  Never done  . TETANUS/TDAP  Never done  . PNA vac Low Risk Adult (1 of 2 - PCV13) Never done  . URINE MICROALBUMIN  11/26/2016  . INFLUENZA VACCINE  07/29/2020    Colorectal cancer screening: Completed 09/04/10. Repeat every 10 years  Lung Cancer Screening: (Low Dose CT Chest recommended if Age 35-80 years, 30 pack-year currently smoking OR have quit w/in 15years.) does qualify.    Additional Screening:  Hepatitis C Screening: does qualify;   Vision Screening: Recommended annual ophthalmology exams for early detection of glaucoma and other disorders of the eye. Is the patient up to date with their annual eye exam?  No  Who is the provider or what  is the name of the office in which the patient attends annual eye  exams? Pt states his daughter works for an eye clinic and he will folow up at her office If pt is not established with a provider, would they like to be referred to a provider to establish care? declines  Dental Screening: Recommended annual dental exams for proper oral hygiene  Community Resource Referral / Chronic Care Management: CRR required this visit?  No   CCM required this visit?  No      Plan:     I have personally reviewed and noted the following in the patient's chart:   . Medical and social history . Use of alcohol, tobacco or illicit drugs  . Current medications and supplements . Functional ability and status . Nutritional status . Physical activity . Advanced directives . List of other physicians . Hospitalizations, surgeries, and ER visits in previous 12 months . Vitals . Screenings to include cognitive, depression, and falls . Referrals and appointments  In addition, I have reviewed and discussed with patient certain preventive protocols, quality metrics, and best practice recommendations. A written personalized care plan for preventive services as well as general preventive health recommendations were provided to patient.     Wayne Brace, LPN   0/06/6225   Nurse Notes: None

## 2020-08-20 ENCOUNTER — Telehealth (INDEPENDENT_AMBULATORY_CARE_PROVIDER_SITE_OTHER): Payer: Medicare PPO | Admitting: Family Medicine

## 2020-08-20 ENCOUNTER — Other Ambulatory Visit: Payer: Self-pay

## 2020-08-20 ENCOUNTER — Encounter: Payer: Self-pay | Admitting: Family Medicine

## 2020-08-20 VITALS — Ht 72.0 in

## 2020-08-20 DIAGNOSIS — M109 Gout, unspecified: Secondary | ICD-10-CM | POA: Diagnosis not present

## 2020-08-20 DIAGNOSIS — I252 Old myocardial infarction: Secondary | ICD-10-CM | POA: Diagnosis not present

## 2020-08-20 MED ORDER — NITROGLYCERIN 0.4 MG SL SUBL: 0.4000 mg | SUBLINGUAL_TABLET | SUBLINGUAL | 3 refills | Status: DC | PRN

## 2020-08-20 MED ORDER — METHYLPREDNISOLONE 4 MG PO TBPK
ORAL_TABLET | ORAL | 1 refills | Status: DC
Start: 2020-08-20 — End: 2021-03-02

## 2020-08-20 NOTE — Progress Notes (Signed)
   Subjective:    Patient ID: Wayne Williams, male    DOB: 1951-05-09, 69 y.o.   MRN: [id number]  HPI Virtual Visit via Telephone Note  I connected with the patient on 08/20/20 at  2:30 PM EDT by telephone and verified that I am speaking with the correct person using two identifiers.   I discussed the limitations, risks, security and privacy concerns of performing an evaluation and management service by telephone and the availability of in person appointments. I also discussed with the patient that there may be a patient responsible charge related to this service. The patient expressed understanding and agreed to proceed.  Location patient: home Location provider: work or home office Participants present for the call: patient, provider Patient did not have a visit in the prior 7 days to address this/these issue(s).   History of Present Illness: He complains of the sudden onset of a severe throbbing pain the left 4th toe. It dies not swell or change colors. This happes every 3-4 months. No hx of trauma. Ibufprofen helps briefly.    Observations/Objective: Patient sounds cheerful and well on the phone. I do not appreciate any SOB. Speech and thought processing are grossly intact. Patient reported vitals:  Assessment and Plan: This sounds like gout. Treat with a Medrol dose pack. Alysia Penna, MD   Follow Up Instructions:     [phone] 5-10 [phone] 11-20 [phone] 21-30 I did not refer this patient for an OV in the next 24 hours for this/these issue(s).  I discussed the assessment and treatment plan with the patient. The patient was provided an opportunity to ask questions and all were answered. The patient agreed with the plan and demonstrated an understanding of the instructions.   The patient was advised to call back or seek an in-person evaluation if the symptoms worsen or if the condition fails to improve as anticipated.  I provided 10 minutes of non-face-to-face time during this  encounter.   Alysia Penna, MD    Review of Systems     Objective:   Physical Exam        Assessment & Plan:

## 2020-08-23 ENCOUNTER — Telehealth: Payer: Self-pay | Admitting: *Deleted

## 2020-08-23 NOTE — Telephone Encounter (Signed)
Patient called nurse triage on 08/22/2020. Patient reports he has been having pain in his toe since Monday. Today, he took a deep breath and had terrible pain in the left shoulder. He wants to know if this is related to possible gout. (pain rated as a 5 on the 1 to 10 scale). No severe breathing difficulty. No chest pain. No injury in the past 3 days. No fever. Alert and responsive.   Patient had a televisit with PCP on 08/23

## 2020-08-24 MED ORDER — COLCHICINE 0.6 MG PO CAPS: 0.6000 mg | ORAL_CAPSULE | Freq: Four times a day (QID) | ORAL | 2 refills | Status: DC | PRN

## 2020-08-24 NOTE — Telephone Encounter (Signed)
Rx sent in. Spoke with the patient. He is aware.

## 2020-08-24 NOTE — Telephone Encounter (Signed)
Yes it could related to gout. Call in [place] 0.6 mg to take every 6 hours prn gout, #60 with 2 rf

## 2020-09-24 ENCOUNTER — Encounter: Payer: Self-pay | Admitting: Internal Medicine

## 2020-10-05 ENCOUNTER — Telehealth: Payer: Self-pay | Admitting: Family Medicine

## 2020-10-05 NOTE — Telephone Encounter (Signed)
Marjory Lies the pharmacist is calling in stating that the pts Rx fluticasone-salmeterol ([place]) 230-21 MCG the insurance will not cover it and would like to see if they can get something else.

## 2020-10-08 NOTE — Telephone Encounter (Signed)
Patient called after talking to the insurance company and they stated that they won't cover the fluticasone-salmeterol ([place]) 230-21 MCG/ACT inhaler  They will cover Adair Patter  He wanted to know if Dr. Sarajane Jews can call him in this Rx  [facility] [place] [place] Phone:  [phone]  Fax:  [phone]

## 2020-10-08 NOTE — Telephone Encounter (Signed)
Pt contacted and requested to speak to insurance carrier to find out what alternative to Advair they will cover.

## 2020-10-08 NOTE — Telephone Encounter (Signed)
We can change it, but we need to find out what they will cover

## 2020-10-09 MED ORDER — BREO ELLIPTA 200-25 MCG/INH IN AEPB: 1.0000 | INHALATION_SPRAY | Freq: Every day | RESPIRATORY_TRACT | 5 refills | Status: DC

## 2020-10-09 NOTE — Telephone Encounter (Signed)
I sent in a rx for the [facility] inhaler

## 2020-12-03 ENCOUNTER — Other Ambulatory Visit: Payer: Self-pay | Admitting: Family Medicine

## 2020-12-09 ENCOUNTER — Other Ambulatory Visit: Payer: Self-pay | Admitting: Family Medicine

## 2020-12-20 ENCOUNTER — Other Ambulatory Visit: Payer: Self-pay | Admitting: Family Medicine

## 2021-01-04 ENCOUNTER — Other Ambulatory Visit: Payer: Self-pay | Admitting: Family Medicine

## 2021-01-04 NOTE — Telephone Encounter (Signed)
Last office visit- 08/20/2020 Last refill- 07/09/2020

## 2021-01-18 ENCOUNTER — Emergency Department (HOSPITAL_BASED_OUTPATIENT_CLINIC_OR_DEPARTMENT_OTHER)
Admission: EM | Admit: 2021-01-18 | Discharge: 2021-01-18 | Disposition: A | Discharge status: Home / Self Care | Payer: Medicare PPO | Attending: Emergency Medicine | Admitting: Emergency Medicine

## 2021-01-18 ENCOUNTER — Other Ambulatory Visit: Payer: Self-pay

## 2021-01-18 ENCOUNTER — Encounter (HOSPITAL_BASED_OUTPATIENT_CLINIC_OR_DEPARTMENT_OTHER): Payer: Self-pay

## 2021-01-18 ENCOUNTER — Emergency Department (HOSPITAL_BASED_OUTPATIENT_CLINIC_OR_DEPARTMENT_OTHER): Payer: Medicare PPO

## 2021-01-18 DIAGNOSIS — J441 Chronic obstructive pulmonary disease with (acute) exacerbation: Secondary | ICD-10-CM | POA: Insufficient documentation

## 2021-01-18 DIAGNOSIS — R000 Tachycardia, unspecified: Secondary | ICD-10-CM | POA: Diagnosis not present

## 2021-01-18 DIAGNOSIS — Z7902 Long term (current) use of antithrombotics/antiplatelets: Secondary | ICD-10-CM | POA: Diagnosis not present

## 2021-01-18 DIAGNOSIS — I10 Essential (primary) hypertension: Secondary | ICD-10-CM | POA: Insufficient documentation

## 2021-01-18 DIAGNOSIS — Z79899 Other long term (current) drug therapy: Secondary | ICD-10-CM | POA: Insufficient documentation

## 2021-01-18 DIAGNOSIS — E119 Type 2 diabetes mellitus without complications: Secondary | ICD-10-CM | POA: Diagnosis not present

## 2021-01-18 DIAGNOSIS — Z7982 Long term (current) use of aspirin: Secondary | ICD-10-CM | POA: Insufficient documentation

## 2021-01-18 DIAGNOSIS — I2510 Atherosclerotic heart disease of native coronary artery without angina pectoris: Secondary | ICD-10-CM | POA: Insufficient documentation

## 2021-01-18 DIAGNOSIS — R0602 Shortness of breath: Secondary | ICD-10-CM | POA: Diagnosis not present

## 2021-01-18 DIAGNOSIS — Z7951 Long term (current) use of inhaled steroids: Secondary | ICD-10-CM | POA: Diagnosis not present

## 2021-01-18 DIAGNOSIS — Z7984 Long term (current) use of oral hypoglycemic drugs: Secondary | ICD-10-CM | POA: Insufficient documentation

## 2021-01-18 DIAGNOSIS — J90 Pleural effusion, not elsewhere classified: Secondary | ICD-10-CM | POA: Diagnosis not present

## 2021-01-18 DIAGNOSIS — Z20822 Contact with and (suspected) exposure to covid-19: Secondary | ICD-10-CM | POA: Insufficient documentation

## 2021-01-18 DIAGNOSIS — F17210 Nicotine dependence, cigarettes, uncomplicated: Secondary | ICD-10-CM | POA: Diagnosis not present

## 2021-01-18 LAB — CBC WITH DIFFERENTIAL/PLATELET
Abs Immature Granulocytes: 0.02 10*3/uL (ref 0.00–0.07)
Basophils Absolute: 0.1 10*3/uL (ref 0.0–0.1)
Basophils Relative: 1 %
Eosinophils Absolute: 0.7 10*3/uL — ABNORMAL HIGH (ref 0.0–0.5)
Eosinophils Relative: 9 %
HCT: 42 % (ref 39.0–52.0)
Hemoglobin: 13.7 g/dL (ref 13.0–17.0)
Immature Granulocytes: 0 %
Lymphocytes Relative: 21 %
Lymphs Abs: 1.8 10*3/uL (ref 0.7–4.0)
MCH: 30.6 pg (ref 26.0–34.0)
MCHC: 32.6 g/dL (ref 30.0–36.0)
MCV: 94 fL (ref 80.0–100.0)
Monocytes Absolute: 0.7 10*3/uL (ref 0.1–1.0)
Monocytes Relative: 8 %
Neutro Abs: 5.1 10*3/uL (ref 1.7–7.7)
Neutrophils Relative %: 61 %
Platelets: 253 10*3/uL (ref 150–400)
RBC: 4.47 MIL/uL (ref 4.22–5.81)
RDW: 14.7 % (ref 11.5–15.5)
WBC: 8.4 10*3/uL (ref 4.0–10.5)
nRBC: 0 % (ref 0.0–0.2)

## 2021-01-18 LAB — COMPREHENSIVE METABOLIC PANEL WITH GFR
ALT: 16 U/L (ref 0–44)
AST: 22 U/L (ref 15–41)
Albumin: 3.9 g/dL (ref 3.5–5.0)
Alkaline Phosphatase: 50 U/L (ref 38–126)
Anion gap: 12 (ref 5–15)
BUN: 20 mg/dL (ref 8–23)
CO2: 25 mmol/L (ref 22–32)
Calcium: 9.1 mg/dL (ref 8.9–10.3)
Chloride: 102 mmol/L (ref 98–111)
Creatinine, Ser: 1.19 mg/dL (ref 0.61–1.24)
GFR, Estimated: >60 mL/min
Glucose, Bld: 117 mg/dL — ABNORMAL HIGH (ref 70–99)
Potassium: 4.3 mmol/L (ref 3.5–5.1)
Sodium: 139 mmol/L (ref 135–145)
Total Bilirubin: 0.4 mg/dL (ref 0.3–1.2)
Total Protein: 6.9 g/dL (ref 6.5–8.1)

## 2021-01-18 LAB — TROPONIN I (HIGH SENSITIVITY): Troponin I (High Sensitivity): 13 ng/L (ref <18)

## 2021-01-18 LAB — CBG MONITORING, ED: Glucose-Capillary: 90 mg/dL (ref 70–99)

## 2021-01-18 LAB — SARS CORONAVIRUS 2 (TAT 6-24 HRS): SARS Coronavirus 2: NEGATIVE

## 2021-01-18 MED ORDER — ALBUTEROL (5 MG/ML) CONTINUOUS INHALATION SOLN: 10.0000 mg/h | INHALATION_SOLUTION | Freq: Once | RESPIRATORY_TRACT | Status: DC

## 2021-01-18 MED ORDER — METHYLPREDNISOLONE SODIUM SUCC 125 MG IJ SOLR
125.0000 mg | Freq: Once | INTRAMUSCULAR | Status: AC
  Administered 2021-01-18: 125 mg via INTRAVENOUS
  Filled 2021-01-18: qty 2

## 2021-01-18 MED ORDER — PREDNISONE 20 MG PO TABS: 40.0000 mg | ORAL_TABLET | Freq: Every day | ORAL | 0 refills | Status: AC

## 2021-01-18 MED ORDER — ALBUTEROL SULFATE HFA 108 (90 BASE) MCG/ACT IN AERS
2.0000 | INHALATION_SPRAY | Freq: Once | RESPIRATORY_TRACT | Status: AC
  Administered 2021-01-18: 2 via RESPIRATORY_TRACT

## 2021-01-18 MED ORDER — ALBUTEROL SULFATE HFA 108 (90 BASE) MCG/ACT IN AERS
8.0000 | INHALATION_SPRAY | Freq: Once | RESPIRATORY_TRACT | Status: AC
  Administered 2021-01-18: 8 via RESPIRATORY_TRACT
  Filled 2021-01-18: qty 6.7

## 2021-01-18 NOTE — Discharge Instructions (Addendum)
You came to the emergency department to be evaluated for your shortness of breath.  Your lab work was reassuring.  Your chest x-ray showed no signs of pneumonia.  You were given albuterol inhaler treatments and steroids to help with your breathing.  I have given you a prescription for steroids today.  Some common side effects include feelings of extra energy, feeling warm, increased appetite, and stomach upset.  If you are diabetic your sugars may run higher than usual.    We also tested you for COVID-19 today.  This test is currently pending.  You can find the results of this test On your [facility] MyChart.  Please isolate at home while awaiting your results.  > If your test is negative, stay home until your fever has resolved/your symptoms are improving. > If your test is positive, isolate at home for at least 5 days after the day your symptoms initially began, and THEN at least 24 hours after you are fever-free without the help of medications (Tylenol/acetaminophen and Advil/ibuprofen/Motrin) AND your symptoms are improving.  You can alternate Tylenol/acetaminophen and Advil/ibuprofen/Motrin every 4 hours for sore throat, body aches, headache or fever.  Drink plenty of water.  Use saline nasal spray for congestion. Wash your hands frequently. Please rest as needed with frequent repositioning and ambulation as tolerated.    If you use a CPAP or BiPAP device for management of obstructive sleep apnea may continue to use it however use it when isolated from other individuals to avoid spread of COVID-19.   If you use a nebulizer administer medication such as albuterol you may continue to use it however only one isolated from other individuals to avoid the spread of COVID-19.  If your symptoms do not improve please follow-up with your primary care provider or urgent care.  Return to the ER for significant shortness of breath, uncontrollable vomiting, severe chest pain, inability to tolerate fluids,  changes in mental status such as confusion or other concerning symptoms.   Return to the emergency department if: You have worsening shortness of breath, even when resting. You have trouble talking. You have severe chest pain. You cough up blood. You have a fever. You have weakness, vomit repeatedly, or faint. You feel confused. You are not able to sleep because of your symptoms. You have trouble doing daily activities.

## 2021-01-18 NOTE — ED Notes (Signed)
ED AP in to speak with pt re: DC instructions and discussed taking steroids and his blood sugar levels, ensured pt understood instructions from PA and as outlined on AVS. Copy of AVS given to daughter. Pt escorted to POV via wc. Able to tx from wc to POV with minimal assistance

## 2021-01-18 NOTE — ED Triage Notes (Signed)
Pt reports shortness of breath that started 3-4 days ago. Pt reports he has COPD that is undiagnosed. Pt has chronic bronchitis.

## 2021-01-18 NOTE — ED Provider Notes (Signed)
[facility] EMERGENCY DEPARTMENT Provider Note   CSN: [id number] Arrival date & time: 01/18/21  1348     History Chief Complaint  Patient presents with  . Shortness of Breath    Wayne Williams is a 70 y.o. male with a history of COPD, diabetes, hypertension, CAD, GERD, history of MI (2009).  Patient presents with a chief complaint of shortness of breath.  Patient reports shortness of breath began gradually 4 days prior, is constant, has worsened, worse with exertion or activity, minimal improvement with albuterol inhaler.  Patient also reports an increase in his sputum production.  Patient endorses productive cough.  Patient denies any home oxygen use.  Patient denies any fevers, chills, sore throat, nasal congestion, loss of smell, loss of taste, abdominal pain, nausea, vomiting, back pain, syncopal episodes, lower extremity swelling, lower extremity tenderness.  Patient denies any recent sick contacts.  Patient endorses COVID-19 vaccination x2 and influenza vaccination.   Shortness of Breath Associated symptoms: cough (productive)   Associated symptoms: no abdominal pain, no chest pain, no fever, no headaches, no neck pain, no rash, no sore throat and no vomiting        Past Medical History:  Diagnosis Date  . Anxiety   . CAD (coronary artery disease)    s/p cabg x 4  . Cerebrovascular accident ([place])    hx of 06 20 09  . Depression    saw Dr. Casimiro Needle in the past  . Diabetes mellitus   . ED (erectile dysfunction)   . GERD (gastroesophageal reflux disease)   . Hypertension   . Insomnia     Patient Active Problem List   Diagnosis Date Noted  . COPD (chronic obstructive pulmonary disease) ([employer]) 04/09/2020  . Diabetes mellitus without complication ([place]) 0000000  . Cerebral artery occlusion with cerebral infarction ([place]) 08/22/2010  . CONSTIPATION 08/19/2010  . MICROSCOPIC HEMATURIA 08/19/2010  . LOSS OF WEIGHT 08/19/2010  . Hyperlipidemia 10/10/2009  . BACK  PAIN, LUMBAR 05/11/2009  . Anxiety state 08/11/2008  . NICOTINE ADDICTION 08/11/2008  . DEPRESSION 08/11/2008  . Essential hypertension 08/11/2008  . MYOCARDIAL INFARCTION, HX OF 08/11/2008  . Coronary atherosclerosis 08/11/2008  . GERD 08/11/2008  . CEREBROVASCULAR ACCIDENT, HX OF 08/11/2008    Past Surgical History:  Procedure Laterality Date  . CERVICAL LAMINECTOMY  1996   per Dr. Hal Neer  . Heart bypass  2001  . LEFT HEART CATHETERIZATION WITH CORONARY/GRAFT ANGIOGRAM N/A 01/24/2015   Procedure: LEFT HEART CATHETERIZATION WITH Beatrix Fetters;  Surgeon: Burnell Blanks, MD;  Location[facility] C[facility] LAB;  Service: Cardiovascular;  Laterality: N/A;  . [place]   per Dr. Gladstone Lighter       Family History  Problem Relation Age of Onset  . Coronary artery disease Other   . Hypertension Other     Social History   Tobacco Use  . Smoking status: Current Every Day Smoker    Packs/day: 1.00    Years: 50.00    Pack years: 50.00    Types: Cigarettes  . Smokeless tobacco: Never Used  Substance Use Topics  . Alcohol use: No    Alcohol/week: 0.0 standard drinks  . Drug use: No    Home Medications Prior to Admission medications   Medication Sig Start Date End Date Taking? Authorizing Provider  albuterol (VENTOLIN HFA) 108 (90 Base) MCG/ACT inhaler Inhale 2 puffs into the lungs every 4 (four) hours as needed for wheezing or shortness of breath. 04/09/20   Sarajane Jews,  Ishmael Holter, MD  ALPRAZolam Duanne Moron) 0.5 MG tablet TAKE 1 TABLET BY MOUTH EVERY 6 (SIX) HOURS AS NEEDED FOR ANXIETY. 01/04/21   Laurey Morale, MD  aspirin 81 MG tablet Take 81 mg by mouth daily.      [provider]  clopidogrel (PLAVIX) 75 MG tablet TAKE 1 TABLET BY MOUTH EVERY DAY 06/11/20   Laurey Morale, MD  Colchicine (MITIGARE) 0.6 MG CAPS Take 0.6 mg by mouth every 6 (six) hours as needed. 08/24/20   Laurey Morale, MD  fluticasone furoate-vilanterol (BREO ELLIPTA) 200-25 MCG/INH AEPB Inhale  1 puff into the lungs daily in the afternoon. 10/09/20   Laurey Morale, MD  metFORMIN (GLUCOPHAGE) 1000 MG tablet TAKE 1 TABLET BY MOUTH TWICE A DAY 12/24/20   Laurey Morale, MD  methylPREDNISolone (MEDROL DOSEPAK) 4 MG TBPK tablet As directed 08/20/20   Laurey Morale, MD  metoprolol succinate (TOPROL-XL) 50 MG 24 hr tablet TAKE 1 TABLET BY MOUTH DAILY WITH OR IMMEDIATELY FOLLOWING A MEAL. 12/03/20   Laurey Morale, MD  nitroGLYCERIN (NITROSTAT) 0.4 MG SL tablet Place 1 tablet (0.4 mg total) under the tongue every 5 (five) minutes as needed for chest pain. 08/20/20   Laurey Morale, MD  simvastatin (ZOCOR) 40 MG tablet TAKE 1 TABLET BY MOUTH EVERYDAY AT BEDTIME 06/01/20   Laurey Morale, MD  tamsulosin (FLOMAX) 0.4 MG CAPS capsule TAKE 1 CAPSULE BY MOUTH EVERY DAY 12/10/20   Laurey Morale, MD  triamcinolone cream (KENALOG) 0.1 % Apply 1 application topically 2 (two) times daily as needed. 06/22/20   Laurey Morale, MD    Allergies    Patient has no known allergies.  Review of Systems   Review of Systems  Constitutional: Negative for chills and fever.  HENT: Negative for congestion and sore throat.   Respiratory: Positive for cough (productive) and shortness of breath.   Cardiovascular: Negative for chest pain and leg swelling.  Gastrointestinal: Negative for abdominal pain, nausea and vomiting.  Genitourinary: Negative for dysuria.  Musculoskeletal: Negative for back pain and neck pain.  Skin: Negative for color change and rash.  Neurological: Negative for dizziness, syncope, light-headedness and headaches.  Psychiatric/Behavioral: Negative for confusion.    Physical Exam Updated Vital Signs BP 108/70 (BP Location: Left Arm)   Pulse (!) 108   Temp 98.3 F (36.8 C) (Oral)   Resp (!) 24   Ht 5\' 11"  (1.803 m)   SpO2 94%   BMI 21.37 kg/m   Physical Exam Vitals and nursing note reviewed.  Constitutional:      General: He is not in acute distress.    Appearance: He is not  toxic-appearing or diaphoretic.  HENT:     Head: Normocephalic.  Eyes:     General: No scleral icterus.       Right eye: No discharge.        Left eye: No discharge.  Cardiovascular:     Rate and Rhythm: Tachycardia present.     Heart sounds: Normal heart sounds.  Pulmonary:     Effort: Tachypnea and prolonged expiration present. No respiratory distress.     Breath sounds: No stridor. Examination of the right-middle field reveals decreased breath sounds. Examination of the left-middle field reveals decreased breath sounds. Examination of the right-lower field reveals decreased breath sounds and rhonchi. Examination of the left-lower field reveals decreased breath sounds and rhonchi. Decreased breath sounds and rhonchi present.  Abdominal:     General:  There is no distension.     Palpations: Abdomen is soft. There is no mass.     Tenderness: There is no abdominal tenderness.  Musculoskeletal:     Cervical back: Neck supple.     Right lower leg: No tenderness. No edema.     Left lower leg: No tenderness. No edema.  Skin:    General: Skin is warm and dry.  Neurological:     General: No focal deficit present.     Mental Status: He is alert.  Psychiatric:        Behavior: Behavior is cooperative.     ED Results / Procedures / Treatments   Labs (all labs ordered are listed, but only abnormal results are displayed) Labs Reviewed  CBC WITH DIFFERENTIAL/PLATELET - Abnormal; Notable for the following components:      Result Value   Eosinophils Absolute 0.7 (*)    All other components within normal limits  COMPREHENSIVE METABOLIC PANEL - Abnormal; Notable for the following components:   Glucose, Bld 117 (*)    All other components within normal limits  SARS CORONAVIRUS 2 (TAT 6-24 HRS)  CBG MONITORING, ED  TROPONIN I (HIGH SENSITIVITY)    EKG EKG Interpretation  Date/Time:  Friday January 18 2021 15:43:10 EST Ventricular Rate:  88 PR Interval:    QRS Duration: 150 QT  Interval:  419 QTC Calculation: 507 R Axis:   -82 Text Interpretation: Sinus rhythm Atrial premature complex Probable left atrial enlargement IVCD, consider atypical RBBB LVH with secondary repolarization abnormality Confirmed by Madalyn Rob [phone]) on 01/18/2021 3:53:42 PM   Radiology DG Chest Portable 1 View  Result Date: 01/18/2021 CLINICAL DATA:  Shortness of breath EXAM: PORTABLE CHEST 1 VIEW COMPARISON:  03/28/2020 FINDINGS: Small left and trace right pleural effusions. No frank interstitial edema. No focal consolidation. No pneumothorax. The heart is normal in size. Postsurgical changes related to prior CABG. Median sternotomy. IMPRESSION: Small left and trace right pleural effusions. No frank interstitial edema. Electronically Signed   By: Julian Hy M.D.   On: 01/18/2021 14:44    Procedures Procedures (including critical care time)  Medications Ordered in ED Medications  albuterol (PROVENTIL,VENTOLIN) solution continuous neb (has no administration in time range)  methylPREDNISolone sodium succinate (SOLU-MEDROL) 125 mg/2 mL injection 125 mg (has no administration in time range)    ED Course  I have reviewed the triage vital signs and the nursing notes.  Pertinent labs & imaging results that were available during my care of the patient were reviewed by me and considered in my medical decision making (see chart for details).    MDM Rules/Calculators/A&P                          Alert 70 year old male, in no acute distress.  Presents with chief complaint of shortness of breath x4 days.  Patient has a history of COPD.  Not on any home oxygen.  Patient reports minimal improvement in breathing with albuterol inhaler.  Patient endorses increase in sputum production.  No fevers, chills, sore throat, nasal congestion, loss of smell or taste, chest pain, swelling or tenderness in the lower extremities, hemoptysis.  Patient reports COVID-19 vaccination influenza  vaccination.  Patient speaks in shortened sentences.  Oxygen saturation 94% or greater on room air.  Lung sounds decreased bilaterally, rhonchi in bilateral lower lobes.    Patient ordered albuterol Solu-Medrol.  CMP, CBC, chest x-ray, COVID-19 test ordered and pending.  Patient reports improvement in breathing after receiving albuterol and Solu-Medrol.  CBC and CMP were unremarkable.  Chest x-ray showed small left and trace right pleural effusions, no frank interstitial edema, no focal consolidations, no pneumothorax.    EKG was ordered and showed Sinus rhythm atrial premature complex, probable left atrial enlargement, consider atypical right bundle branch block, LVH with secondary repolarization abnormality.  Troponin ordered and found to be 13.  Patient continues to deny any chest pain.  Less concern for ACS.  Patient denies any signs or symptoms for DVT, no history of DVT or PE, no hemoptysis, no recent surgery or immobilization, no malignancy treatment within the last 6 months, no hormone therapy.  Less concern for PE.  Patient was ambulated on room air SPO2 maintained 90 to 92%.  Patient's vitals remain stable.  With patient having history of COPD, breathing improved with steroids and albuterol.  It is likely patient is experiencing shortness of breath from COPD exacerbation.  Patient was discharged with 5-day course of prednisone.  Patient will follow-up with his primary care provider. Discussed results, findings, treatment and follow up. Patient advised of return precautions. Patient verbalized understanding and agreed with plan.  Patient was discussed with and evaluated by Dr. Roslynn Amble.     Final Clinical Impression(s) / ED Diagnoses Final diagnoses:  COPD exacerbation ([place])  Shortness of breath    Rx / DC Orders ED Discharge Orders         Ordered    predniSONE (DELTASONE) 20 MG tablet  Daily        01/18/21 1721           Dyann Ruddle 01/19/21 [id number]     Elnora Morrison, MD 01/19/21 1723

## 2021-01-18 NOTE — ED Notes (Signed)
Ambulated in room on r/a SpO2 90-92%, HR 95-110, approximately 20 steps.

## 2021-01-21 ENCOUNTER — Encounter: Payer: Self-pay | Admitting: Family Medicine

## 2021-01-21 ENCOUNTER — Other Ambulatory Visit: Payer: Self-pay

## 2021-01-21 ENCOUNTER — Telehealth: Payer: Medicare PPO | Admitting: Family Medicine

## 2021-01-21 VITALS — Ht 72.0 in | Wt 150.0 lb

## 2021-01-21 NOTE — Progress Notes (Signed)
Spoke with pt per dr. Sarajane Jews okay for patient to cancel appointment today. Dr. Sarajane Jews is okay with pt being scheduled for in office visit if symptoms worsen.

## 2021-02-07 ENCOUNTER — Other Ambulatory Visit: Payer: Self-pay | Admitting: Family Medicine

## 2021-02-28 ENCOUNTER — Other Ambulatory Visit: Payer: Self-pay | Admitting: Family Medicine

## 2021-03-02 ENCOUNTER — Encounter (HOSPITAL_COMMUNITY): Payer: Self-pay

## 2021-03-02 ENCOUNTER — Other Ambulatory Visit: Payer: Self-pay

## 2021-03-02 ENCOUNTER — Emergency Department (HOSPITAL_COMMUNITY)
Admission: EM | Admit: 2021-03-02 | Discharge: 2021-03-02 | Disposition: A | Discharge status: Home / Self Care | Payer: Medicare PPO | Attending: Emergency Medicine | Admitting: Emergency Medicine

## 2021-03-02 ENCOUNTER — Emergency Department (HOSPITAL_COMMUNITY): Payer: Medicare PPO

## 2021-03-02 DIAGNOSIS — Z79899 Other long term (current) drug therapy: Secondary | ICD-10-CM | POA: Diagnosis not present

## 2021-03-02 DIAGNOSIS — R778 Other specified abnormalities of plasma proteins: Secondary | ICD-10-CM | POA: Diagnosis not present

## 2021-03-02 DIAGNOSIS — I10 Essential (primary) hypertension: Secondary | ICD-10-CM | POA: Diagnosis not present

## 2021-03-02 DIAGNOSIS — Z7984 Long term (current) use of oral hypoglycemic drugs: Secondary | ICD-10-CM | POA: Insufficient documentation

## 2021-03-02 DIAGNOSIS — R0600 Dyspnea, unspecified: Secondary | ICD-10-CM | POA: Diagnosis not present

## 2021-03-02 DIAGNOSIS — R0689 Other abnormalities of breathing: Secondary | ICD-10-CM | POA: Diagnosis not present

## 2021-03-02 DIAGNOSIS — I2510 Atherosclerotic heart disease of native coronary artery without angina pectoris: Secondary | ICD-10-CM | POA: Diagnosis not present

## 2021-03-02 DIAGNOSIS — Z7951 Long term (current) use of inhaled steroids: Secondary | ICD-10-CM | POA: Diagnosis not present

## 2021-03-02 DIAGNOSIS — Z951 Presence of aortocoronary bypass graft: Secondary | ICD-10-CM | POA: Diagnosis not present

## 2021-03-02 DIAGNOSIS — E119 Type 2 diabetes mellitus without complications: Secondary | ICD-10-CM | POA: Insufficient documentation

## 2021-03-02 DIAGNOSIS — F17210 Nicotine dependence, cigarettes, uncomplicated: Secondary | ICD-10-CM | POA: Insufficient documentation

## 2021-03-02 DIAGNOSIS — R062 Wheezing: Secondary | ICD-10-CM | POA: Diagnosis not present

## 2021-03-02 DIAGNOSIS — Z7982 Long term (current) use of aspirin: Secondary | ICD-10-CM | POA: Insufficient documentation

## 2021-03-02 DIAGNOSIS — J441 Chronic obstructive pulmonary disease with (acute) exacerbation: Secondary | ICD-10-CM | POA: Diagnosis not present

## 2021-03-02 DIAGNOSIS — R0602 Shortness of breath: Secondary | ICD-10-CM | POA: Diagnosis not present

## 2021-03-02 DIAGNOSIS — R0902 Hypoxemia: Secondary | ICD-10-CM | POA: Diagnosis not present

## 2021-03-02 DIAGNOSIS — Z7902 Long term (current) use of antithrombotics/antiplatelets: Secondary | ICD-10-CM | POA: Diagnosis not present

## 2021-03-02 DIAGNOSIS — J449 Chronic obstructive pulmonary disease, unspecified: Secondary | ICD-10-CM | POA: Diagnosis not present

## 2021-03-02 DIAGNOSIS — R000 Tachycardia, unspecified: Secondary | ICD-10-CM | POA: Diagnosis not present

## 2021-03-02 DIAGNOSIS — Z20822 Contact with and (suspected) exposure to covid-19: Secondary | ICD-10-CM | POA: Insufficient documentation

## 2021-03-02 LAB — RESP PANEL BY RT-PCR (FLU A&B, COVID) ARPGX2
Influenza A by PCR: NEGATIVE
Influenza B by PCR: NEGATIVE
SARS Coronavirus 2 by RT PCR: NEGATIVE

## 2021-03-02 LAB — I-STAT CHEM 8, ED
BUN: 26 mg/dL — ABNORMAL HIGH (ref 8–23)
Calcium, Ion: 1.19 mmol/L (ref 1.15–1.40)
Chloride: 107 mmol/L (ref 98–111)
Creatinine, Ser: 1 mg/dL (ref 0.61–1.24)
Glucose, Bld: 174 mg/dL — ABNORMAL HIGH (ref 70–99)
HCT: 39 % (ref 39.0–52.0)
Hemoglobin: 13.3 g/dL (ref 13.0–17.0)
Potassium: 4.4 mmol/L (ref 3.5–5.1)
Sodium: 142 mmol/L (ref 135–145)
TCO2: 25 mmol/L (ref 22–32)

## 2021-03-02 LAB — I-STAT ARTERIAL BLOOD GAS, ED
Acid-base deficit: 2 mmol/L (ref 0.0–2.0)
Bicarbonate: 24 mmol/L (ref 20.0–28.0)
Calcium, Ion: 1.24 mmol/L (ref 1.15–1.40)
HCT: 38 % — ABNORMAL LOW (ref 39.0–52.0)
Hemoglobin: 12.9 g/dL — ABNORMAL LOW (ref 13.0–17.0)
O2 Saturation: 90 %
Potassium: 4.3 mmol/L (ref 3.5–5.1)
Sodium: 141 mmol/L (ref 135–145)
TCO2: 25 mmol/L (ref 22–32)
pCO2 arterial: 43.5 mmHg (ref 32.0–48.0)
pH, Arterial: 7.349 — ABNORMAL LOW (ref 7.350–7.450)
pO2, Arterial: 61 mmHg — ABNORMAL LOW (ref 83.0–108.0)

## 2021-03-02 LAB — CBC WITH DIFFERENTIAL/PLATELET
Abs Immature Granulocytes: 0.04 10*3/uL (ref 0.00–0.07)
Basophils Absolute: 0.1 10*3/uL (ref 0.0–0.1)
Basophils Relative: 1 %
Eosinophils Absolute: 0.6 10*3/uL — ABNORMAL HIGH (ref 0.0–0.5)
Eosinophils Relative: 5 %
HCT: 43.8 % (ref 39.0–52.0)
Hemoglobin: 13.7 g/dL (ref 13.0–17.0)
Immature Granulocytes: 0 %
Lymphocytes Relative: 21 %
Lymphs Abs: 2.5 10*3/uL (ref 0.7–4.0)
MCH: 30.9 pg (ref 26.0–34.0)
MCHC: 31.3 g/dL (ref 30.0–36.0)
MCV: 98.6 fL (ref 80.0–100.0)
Monocytes Absolute: 0.9 10*3/uL (ref 0.1–1.0)
Monocytes Relative: 8 %
Neutro Abs: 7.8 10*3/uL — ABNORMAL HIGH (ref 1.7–7.7)
Neutrophils Relative %: 65 %
Platelets: 295 10*3/uL (ref 150–400)
RBC: 4.44 MIL/uL (ref 4.22–5.81)
RDW: 14.9 % (ref 11.5–15.5)
WBC: 12.1 10*3/uL — ABNORMAL HIGH (ref 4.0–10.5)
nRBC: 0 % (ref 0.0–0.2)

## 2021-03-02 LAB — I-STAT VENOUS BLOOD GAS, ED
Acid-Base Excess: 1 mmol/L (ref 0.0–2.0)
Bicarbonate: 29.6 mmol/L — ABNORMAL HIGH (ref 20.0–28.0)
Calcium, Ion: 1.06 mmol/L — ABNORMAL LOW (ref 1.15–1.40)
HCT: 35 % — ABNORMAL LOW (ref 39.0–52.0)
Hemoglobin: 11.9 g/dL — ABNORMAL LOW (ref 13.0–17.0)
O2 Saturation: 77 %
Potassium: 5.7 mmol/L — ABNORMAL HIGH (ref 3.5–5.1)
Sodium: 135 mmol/L (ref 135–145)
TCO2: 32 mmol/L (ref 22–32)
pCO2, Ven: 68.8 mmHg — ABNORMAL HIGH (ref 44.0–60.0)
pH, Ven: 7.241 — ABNORMAL LOW (ref 7.250–7.430)
pO2, Ven: 50 mmHg — ABNORMAL HIGH (ref 32.0–45.0)

## 2021-03-02 LAB — COMPREHENSIVE METABOLIC PANEL
ALT: 24 U/L (ref 0–44)
AST: 59 U/L — ABNORMAL HIGH (ref 15–41)
Albumin: 3.5 g/dL (ref 3.5–5.0)
Alkaline Phosphatase: 50 U/L (ref 38–126)
Anion gap: 11 (ref 5–15)
BUN: 19 mg/dL (ref 8–23)
CO2: 21 mmol/L — ABNORMAL LOW (ref 22–32)
Calcium: 8.8 mg/dL — ABNORMAL LOW (ref 8.9–10.3)
Chloride: 106 mmol/L (ref 98–111)
Creatinine, Ser: 1.06 mg/dL (ref 0.61–1.24)
GFR, Estimated: >60 mL/min (ref >60)
Glucose, Bld: 125 mg/dL — ABNORMAL HIGH (ref 70–99)
Potassium: 6.2 mmol/L — ABNORMAL HIGH (ref 3.5–5.1)
Sodium: 138 mmol/L (ref 135–145)
Total Bilirubin: 1.3 mg/dL — ABNORMAL HIGH (ref 0.3–1.2)
Total Protein: 6.3 g/dL — ABNORMAL LOW (ref 6.5–8.1)

## 2021-03-02 LAB — TROPONIN I (HIGH SENSITIVITY)
Troponin I (High Sensitivity): 12 ng/L (ref <18)
Troponin I (High Sensitivity): 32 ng/L — ABNORMAL HIGH (ref <18)

## 2021-03-02 LAB — BRAIN NATRIURETIC PEPTIDE: B Natriuretic Peptide: 204.8 pg/mL — ABNORMAL HIGH (ref 0.0–100.0)

## 2021-03-02 MED ORDER — ALBUTEROL SULFATE HFA 108 (90 BASE) MCG/ACT IN AERS: 8.0000 | INHALATION_SPRAY | Freq: Once | RESPIRATORY_TRACT | Status: DC

## 2021-03-02 MED ORDER — ALBUTEROL SULFATE (2.5 MG/3ML) 0.083% IN NEBU
5.0000 mg | INHALATION_SOLUTION | Freq: Once | RESPIRATORY_TRACT | Status: AC
  Administered 2021-03-02: 5 mg via RESPIRATORY_TRACT
  Filled 2021-03-02: qty 6

## 2021-03-02 MED ORDER — ASPIRIN 81 MG PO CHEW
324.0000 mg | CHEWABLE_TABLET | Freq: Once | ORAL | Status: AC
  Administered 2021-03-02: 324 mg via ORAL
  Filled 2021-03-02: qty 4

## 2021-03-02 MED ORDER — ALBUTEROL SULFATE (2.5 MG/3ML) 0.083% IN NEBU
INHALATION_SOLUTION | RESPIRATORY_TRACT | Status: AC
  Administered 2021-03-02: 5 mg via RESPIRATORY_TRACT
  Filled 2021-03-02: qty 6

## 2021-03-02 MED ORDER — ALBUTEROL SULFATE (2.5 MG/3ML) 0.083% IN NEBU: 5.0000 mg | INHALATION_SOLUTION | Freq: Once | RESPIRATORY_TRACT | Status: AC

## 2021-03-02 MED ORDER — PREDNISONE 20 MG PO TABS: 40.0000 mg | ORAL_TABLET | Freq: Every day | ORAL | 0 refills | Status: DC

## 2021-03-02 NOTE — ED Provider Notes (Signed)
[facility] EMERGENCY DEPARTMENT Provider Note   CSN: [id number] Arrival date & time: 03/02/21  9485     History Chief Complaint  Patient presents with  . Respiratory Distress    Wayne Williams is a 70 y.o. male.  70 year old male with past medical history below including COPD, CAD status post CABG, CVA, type 2 diabetes mellitus, hypertension who presents with shortness of breath.  Patient has been having problems with shortness of breath for several weeks.  He was evaluated in the ED and treated w/ steroids, which he states seemed to improve his symptoms but his SOB eventually returned once the medication was finished. He reports non-productive cough. Normally uses inhaler and has some relief but tonight he was using inhaler and it wasn't helping his breathing. On EMS arrival, pt was 90% on RA, given duonebs, atrovent, solumedrol, and Mg in route. Pt denies chest pain, fevers. He has occasional mild ankle swelling b/l.   The history is provided by the patient.       Past Medical History:  Diagnosis Date  . Anxiety   . CAD (coronary artery disease)    s/p cabg x 4  . Cerebrovascular accident ([place])    hx of 06 20 09  . Depression    saw Dr. Casimiro Needle in the past  . Diabetes mellitus   . ED (erectile dysfunction)   . GERD (gastroesophageal reflux disease)   . Hypertension   . Insomnia     Patient Active Problem List   Diagnosis Date Noted  . COPD (chronic obstructive pulmonary disease) ([place]) 04/09/2020  . Diabetes mellitus without complication ([place]) 46/27/0350  . Cerebral artery occlusion with cerebral infarction ([place]) 08/22/2010  . CONSTIPATION 08/19/2010  . MICROSCOPIC HEMATURIA 08/19/2010  . LOSS OF WEIGHT 08/19/2010  . Hyperlipidemia 10/10/2009  . BACK PAIN, LUMBAR 05/11/2009  . Anxiety state 08/11/2008  . NICOTINE ADDICTION 08/11/2008  . DEPRESSION 08/11/2008  . Essential hypertension 08/11/2008  . MYOCARDIAL INFARCTION, HX OF 08/11/2008  .  Coronary atherosclerosis 08/11/2008  . GERD 08/11/2008  . CEREBROVASCULAR ACCIDENT, HX OF 08/11/2008    Past Surgical History:  Procedure Laterality Date  . CERVICAL LAMINECTOMY  1996   per Dr. Hal Neer  . Heart bypass  2001  . LEFT HEART CATHETERIZATION WITH CORONARY/GRAFT ANGIOGRAM N/A 01/24/2015   Procedure: LEFT HEART CATHETERIZATION WITH Beatrix Fetters;  Surgeon: Burnell Blanks, MD;  Location: [facility] CATH LAB;  Service: Cardiovascular;  Laterality: N/A;  . [place]   per Dr. Gladstone Lighter       Family History  Problem Relation Age of Onset  . Coronary artery disease Other   . Hypertension Other     Social History   Tobacco Use  . Smoking status: Current Every Day Smoker    Packs/day: 1.00    Years: 50.00    Pack years: 50.00    Types: Cigarettes  . Smokeless tobacco: Never Used  Substance Use Topics  . Alcohol use: No    Alcohol/week: 0.0 standard drinks  . Drug use: No    Home Medications Prior to Admission medications   Medication Sig Start Date End Date Taking? Authorizing Provider  albuterol (VENTOLIN HFA) 108 (90 Base) MCG/ACT inhaler Inhale 2 puffs into the lungs every 4 (four) hours as needed for wheezing or shortness of breath. 04/09/20  Yes Laurey Morale, MD  ALPRAZolam Duanne Moron) 0.5 MG tablet TAKE 1 TABLET BY MOUTH EVERY 6 (SIX) HOURS AS NEEDED FOR ANXIETY. Patient taking  differently: Take 0.5 mg by mouth at bedtime. 01/04/21  Yes Laurey Morale, MD  aspirin 81 MG tablet Take 81 mg by mouth daily.   Yes [provider]  clopidogrel (PLAVIX) 75 MG tablet TAKE 1 TABLET BY MOUTH EVERY DAY Patient taking differently: Take 75 mg by mouth daily. 06/11/20  Yes Laurey Morale, MD  Colchicine (MITIGARE) 0.6 MG CAPS Take 0.6 mg by mouth every 6 (six) hours as needed. Patient taking differently: Take 0.6 mg by mouth every 6 (six) hours as needed (gout flares). 08/24/20  Yes Laurey Morale, MD  fluticasone furoate-vilanterol (BREO ELLIPTA)  200-25 MCG/INH AEPB Inhale 1 puff into the lungs daily in the afternoon. 10/09/20  Yes Laurey Morale, MD  metFORMIN (GLUCOPHAGE) 1000 MG tablet TAKE 1 TABLET BY MOUTH TWICE A DAY Patient taking differently: Take 1,000 mg by mouth 2 (two) times daily with a meal. 12/24/20  Yes Laurey Morale, MD  metoprolol succinate (TOPROL-XL) 50 MG 24 hr tablet TAKE 1 TABLET BY MOUTH DAILY WITH OR IMMEDIATELY FOLLOWING A MEAL. Patient taking differently: Take 50 mg by mouth daily. 12/03/20  Yes Laurey Morale, MD  nitroGLYCERIN (NITROSTAT) 0.4 MG SL tablet Place 1 tablet (0.4 mg total) under the tongue every 5 (five) minutes as needed for chest pain. 08/20/20  Yes Laurey Morale, MD  simvastatin (ZOCOR) 40 MG tablet TAKE 1 TABLET BY MOUTH EVERYDAY AT BEDTIME Patient taking differently: Take 40 mg by mouth at bedtime. 06/01/20  Yes Laurey Morale, MD  tamsulosin (FLOMAX) 0.4 MG CAPS capsule TAKE 1 CAPSULE BY MOUTH EVERY DAY Patient taking differently: Take 0.4 mg by mouth daily. 03/01/21  Yes Laurey Morale, MD  triamcinolone cream (KENALOG) 0.1 % Apply 1 application topically 2 (two) times daily as needed. Patient taking differently: Apply 1 application topically 2 (two) times daily as needed (rash). 06/22/20  Yes Laurey Morale, MD    Allergies    Patient has no known allergies.  Review of Systems   Review of Systems  Unable to perform ROS: Severe respiratory distress    Physical Exam Updated Vital Signs BP 139/82   Pulse (!) 118   Temp 97.7 F (36.5 C) (Axillary)   Resp (!) 24   Ht 6' (1.829 m)   Wt 68 kg   SpO2 100%   BMI 20.34 kg/m   Physical Exam Vitals and nursing note reviewed.  Constitutional:      General: He is in acute distress.     Comments: Cachectic and chronically ill-appearing, in moderate respiratory distress  HENT:     Head: Normocephalic and atraumatic.  Eyes:     Conjunctiva/sclera: Conjunctivae normal.  Cardiovascular:     Rate and Rhythm: Regular rhythm. Tachycardia  present.     Heart sounds: Normal heart sounds. No murmur heard.   Pulmonary:     Effort: Tachypnea, accessory muscle usage, respiratory distress and retractions present.     Comments: Diminished breath sounds bilaterally, rhonchi in bases Abdominal:     General: Abdomen is flat. Bowel sounds are normal. There is no distension.     Palpations: Abdomen is soft.     Tenderness: There is no abdominal tenderness.  Musculoskeletal:     Right lower leg: No edema.     Left lower leg: No edema.  Skin:    General: Skin is warm and dry.  Neurological:     Mental Status: He is alert and oriented to person, place, and time.  Comments: fluent  Psychiatric:        Mood and Affect: Mood normal.        Behavior: Behavior normal.     ED Results / Procedures / Treatments   Labs (all labs ordered are listed, but only abnormal results are displayed) Labs Reviewed  COMPREHENSIVE METABOLIC PANEL - Abnormal; Notable for the following components:      Result Value   Potassium 6.2 (*)    CO2 21 (*)    Glucose, Bld 125 (*)    Calcium 8.8 (*)    Total Protein 6.3 (*)    AST 59 (*)    Total Bilirubin 1.3 (*)    All other components within normal limits  BRAIN NATRIURETIC PEPTIDE - Abnormal; Notable for the following components:   B Natriuretic Peptide 204.8 (*)    All other components within normal limits  CBC WITH DIFFERENTIAL/PLATELET - Abnormal; Notable for the following components:   WBC 12.1 (*)    Neutro Abs 7.8 (*)    Eosinophils Absolute 0.6 (*)    All other components within normal limits  I-STAT VENOUS BLOOD GAS, ED - Abnormal; Notable for the following components:   pH, Ven 7.241 (*)    pCO2, Ven 68.8 (*)    pO2, Ven 50.0 (*)    Bicarbonate 29.6 (*)    Potassium 5.7 (*)    Calcium, Ion 1.06 (*)    HCT 35.0 (*)    Hemoglobin 11.9 (*)    All other components within normal limits  RESP PANEL BY RT-PCR (FLU A&B, COVID) ARPGX2  TROPONIN I (HIGH SENSITIVITY)    EKG EKG  Interpretation  Date/Time:  Saturday March 02 2021 05:40:42 EST Ventricular Rate:  122 PR Interval:    QRS Duration: 156 QT Interval:  380 QTC Calculation: 542 R Axis:   -81 Text Interpretation: Sinus tachycardia RBBB and LAFB LVH with secondary repolarization abnormality rate faster and T waves more peaked Confirmed by Theotis Burrow [phone]) on 03/02/2021 5:48:16 AM   Radiology DG Chest Port 1 View  Result Date: 03/02/2021 CLINICAL DATA:  Dyspnea EXAM: PORTABLE CHEST 1 VIEW COMPARISON:  01/18/2021 FINDINGS: The lungs are symmetrically hyperinflated in keeping with changes of underlying COPD. No superimposed focal pulmonary nodule or infiltrate. No pneumothorax or pleural effusion. Coronary artery bypass grafting has been performed. Cardiac size within normal limits. Pulmonary vascularity is normal. No acute bone abnormality. IMPRESSION: No active disease.  COPD. Electronically Signed   By: Fidela Salisbury MD   On: 03/02/2021 06:24    Procedures Procedures   Medications Ordered in ED Medications  albuterol (PROVENTIL) (2.5 MG/3ML) 0.083% nebulizer solution 5 mg (has no administration in time range)  albuterol (PROVENTIL) (2.5 MG/3ML) 0.083% nebulizer solution 5 mg (5 mg Nebulization Given 03/02/21 0645)    ED Course  I have reviewed the triage vital signs and the nursing notes.  Pertinent labs & imaging results that were available during my care of the patient were reviewed by me and considered in my medical decision making (see chart for details).    MDM Rules/Calculators/A&P                          PT in moderate respiratory distress on arrival. O2 sats stable on RA. Gave albuterol. LAbs show COVID negative, venous pH 7.24/pCO2 68, normal creatinine, BNP 205, WBC 12. CXR negative acute.   On reassessment, pt's work of breathing has improved, O2 sats 91% on RA.  Will continue w/ more albuterol. He states he just started having some central chest pain, have added on repeat EKG and ASA  324mg , will also obtain repeat trop. PT signed out pending reassessment.  Final Clinical Impression(s) / ED Diagnoses Final diagnoses:  None    Rx / DC Orders ED Discharge Orders    None       Joselyn Edling, Wenda Overland, MD 03/02/21 [phone]

## 2021-03-02 NOTE — ED Triage Notes (Signed)
Patient BIB [facility] from home, hx of COPD, called for shortness of breath, has been using his inhaler without relief, EMS reports rhonci and coarse wheezing.  EMS vitals  100% NRB 100 HR 143/80

## 2021-03-02 NOTE — ED Provider Notes (Signed)
8:09 AM Care is in for Dr. Rex Kras.  At time of transfer care, patient is awaiting reassessment and repeat laboratory test including a VBG, delta troponin, and a repeat chemistry given the hyper K, acidosis, and sharp T waves on EKG   we will also have him ambulate with a pulse ox test to see if he is safe for discharge if his breathing is improved.  His oxygen saturations were around 91% on room air at rest.  He was given magnesium, solumedrol, atrovent, and albuterol. Was also given a repeat duoneb after covid test improved. He does not take oxygen at home.    10:56 AM Patient's repeat blood gas shows improvement in the pH and the improvement in the PCO2.  Patient reports feeling much better now.  He was ambulated and did not have hypoxia and he would like to go home.  We did discuss that we are concerned with his troponin rising but he says he is no longer having shortness of breath or chest pain.  He understands extremely strict return precautions and follow-up instructions.  Patient was given the prescription written by Dr. Rex Kras for prednisone taper.  He will follow-up with a PCP.  He understands and was discharged in stable condition.    Clinical Impression: 1. COPD exacerbation ([place])   2. Shortness of breath   3. Elevated troponin     Disposition: Discharge  Condition: Stable  I have discussed the results, Dx and Tx plan with the pt(& family if present). He/she/they expressed understanding and agree(s) with the plan. Discharge instructions discussed at great length. Strict return precautions discussed and pt &/or family have verbalized understanding of the instructions. No further questions at time of discharge.    New Prescriptions   PREDNISONE (DELTASONE) 20 MG TABLET    Take 2 tablets (40 mg total) by mouth daily. Take 40 mg by mouth daily for 3 days, then 20mg  by mouth daily for 3 days, then 10mg  daily for 3 days    Follow Up: Laurey Morale, MD [place] [address] [phone]     [place] [id number] mc [place]              Harland Aguiniga, Gwenyth Allegra, MD 03/02/21 1104

## 2021-03-02 NOTE — ED Notes (Signed)
Pt was ambulated from rm to hallway, O2 was at 92-94% RA, HR was steady at 122-128, pt also went to the BR/voided, was indep at commode. Pt tolerated walk well but wanted stability w/balance by holding onto dinamap, pt was brought back to rm and placed in the bed, HR at 107, O2 at  95%RA

## 2021-03-02 NOTE — Discharge Instructions (Signed)
Your work-up today was concerning for COPD exacerbation.  Your heart enzymes were rising however given your improvement in symptoms you would like to go home.  We do want you to start the steroid taper, please fill the prescription and take them.  Please monitor your breathing very closely and if any symptoms were to change or worsen, please return to the nearest emergency department.  We did discuss the possibility of admission however he wanted to go home.  Please rest

## 2021-03-11 ENCOUNTER — Other Ambulatory Visit: Payer: Self-pay | Admitting: Family Medicine

## 2021-03-23 ENCOUNTER — Other Ambulatory Visit: Payer: Self-pay | Admitting: Family Medicine

## 2021-04-02 ENCOUNTER — Encounter: Payer: Self-pay | Admitting: Family Medicine

## 2021-04-02 ENCOUNTER — Other Ambulatory Visit: Payer: Self-pay

## 2021-04-02 ENCOUNTER — Ambulatory Visit: Payer: Medicare PPO | Admitting: Family Medicine

## 2021-04-02 VITALS — BP 110/76 | HR 96 | Temp 98.3°F | Wt 128.4 lb

## 2021-04-02 DIAGNOSIS — J449 Chronic obstructive pulmonary disease, unspecified: Secondary | ICD-10-CM | POA: Diagnosis not present

## 2021-04-02 MED ORDER — PREDNISONE 10 MG PO TABS: 10.0000 mg | ORAL_TABLET | Freq: Every day | ORAL | 1 refills | Status: DC

## 2021-04-02 MED ORDER — ALBUTEROL SULFATE HFA 108 (90 BASE) MCG/ACT IN AERS: 2.0000 | INHALATION_SPRAY | RESPIRATORY_TRACT | 11 refills | Status: DC | PRN

## 2021-04-02 NOTE — Progress Notes (Signed)
   Subjective:    Patient ID: Wayne Williams, male    DOB: [birth year], 70 y.o.   MRN: [id number]  HPI Here to follow up on several ED visits for COPD exacerbations, one on 01-18-21 and the other on 03-02-21. Each time his CXR showed only COPD with no signs of infection lhe was treated with nebulizations and IV steroids. Today he asks if he can try a small dose of daily steroids. He is using Breo daily and albuterol frequently. He still smokes and he knows he should quit. He has tried Chantix, nicotine patches, etc with no success. He has frequent cough productive of clear sputum and SOB. Some occasional wheezing, no chest pain.    Review of Systems  Constitutional: Negative.   Respiratory: Positive for cough and shortness of breath.   Cardiovascular: Negative.        Objective:   Physical Exam Constitutional:      Comments: He is quite thin, coughs occasionally   Cardiovascular:     Rate and Rhythm: Normal rate and regular rhythm.     Pulses: Normal pulses.     Heart sounds: Normal heart sounds.  Pulmonary:     Effort: No respiratory distress.     Breath sounds: No rales.     Comments: Scattered rhonchi and wheezes  Neurological:     Mental Status: He is alert.           Assessment & Plan:  COPD. He will try taking 10 mg of Prednisone daily. I again urged him to quit smoking. Alysia Penna, MD

## 2021-04-29 ENCOUNTER — Other Ambulatory Visit: Payer: Self-pay | Admitting: Family Medicine

## 2021-05-03 ENCOUNTER — Telehealth: Payer: Self-pay | Admitting: Family Medicine

## 2021-05-03 NOTE — Telephone Encounter (Signed)
Patient is calling back and is requesting a call back and wanted to know why medication was denied, please advise. CB is [phone]

## 2021-05-03 NOTE — Telephone Encounter (Signed)
Spoke with patient, medication had previously been discontinued by Dr. Sarajane Jews. Not experiencing any problems except of having some occasional left toe pain, currently not having any symptoms.  Patient said he was okay and will call office if have worse problems.

## 2021-05-14 ENCOUNTER — Other Ambulatory Visit: Payer: Self-pay | Admitting: Family Medicine

## 2021-05-15 NOTE — Telephone Encounter (Signed)
Last office visit- 04/02/2021 Last refill- 03/12/21-30 tabs, no refills  Can this patient receive a refill?

## 2021-05-16 NOTE — Telephone Encounter (Signed)
Last office labs- 03/28/20.    Please advise

## 2021-05-16 NOTE — Telephone Encounter (Signed)
Pt is calling in needing to have labs done to be able to get a refill on Rx clopidogrel (PLAVIX 75 MG  Pharm:  [facility] [employer] on [address]  Pt is inquiring about if he needs to have a pneumonia vaccine since he is taking prednisone (DELTASONE) 20 MG and have COPD.  Pt would like to have a call back.

## 2021-05-20 ENCOUNTER — Ambulatory Visit: Payer: Medicare PPO | Admitting: Family Medicine

## 2021-05-20 ENCOUNTER — Encounter: Payer: Self-pay | Admitting: Family Medicine

## 2021-05-20 ENCOUNTER — Other Ambulatory Visit: Payer: Self-pay

## 2021-05-20 VITALS — BP 130/68 | HR 92 | Temp 98.5°F | Wt 129.0 lb

## 2021-05-20 DIAGNOSIS — M25472 Effusion, left ankle: Secondary | ICD-10-CM

## 2021-05-20 DIAGNOSIS — I10 Essential (primary) hypertension: Secondary | ICD-10-CM

## 2021-05-20 DIAGNOSIS — E119 Type 2 diabetes mellitus without complications: Secondary | ICD-10-CM

## 2021-05-20 DIAGNOSIS — J449 Chronic obstructive pulmonary disease, unspecified: Secondary | ICD-10-CM

## 2021-05-20 DIAGNOSIS — M25471 Effusion, right ankle: Secondary | ICD-10-CM | POA: Diagnosis not present

## 2021-05-20 LAB — POCT GLYCOSYLATED HEMOGLOBIN (HGB A1C): Hemoglobin A1C: 6.1 % — AB (ref 4.0–5.6)

## 2021-05-21 ENCOUNTER — Encounter: Payer: Self-pay | Admitting: Family Medicine

## 2021-05-21 DIAGNOSIS — M25471 Effusion, right ankle: Secondary | ICD-10-CM | POA: Insufficient documentation

## 2021-05-21 NOTE — Progress Notes (Signed)
   Subjective:    Patient ID: Wayne Williams, male    DOB: [birth year], 70 y.o.   MRN: [id number]  HPI Here to check on some mild ankle swelling that he noticed a week ago. No recent change in diet. No chest pain or SOB. The swelling is less pronounced today that it was yesterday. There is no pain in the feet or legs. Of note about 6 weeks ago we started him on a daily dose of 10 mg of Prednisone to help with his breathing, and this has been quite successful. He had labs in march showing normal renal function with a creatinine of 1.00. a BNP then was slightly elevated at 2.4. we did an A1c today and this was 6.1.    Review of Systems  Constitutional: Negative.   Respiratory: Negative.   Cardiovascular: Positive for leg swelling. Negative for chest pain and palpitations.       Objective:   Physical Exam Constitutional:      General: He is not in acute distress.    Comments: Frail   Cardiovascular:     Rate and Rhythm: Normal rate and regular rhythm.     Pulses: Normal pulses.     Heart sounds: Normal heart sounds.  Pulmonary:     Effort: Pulmonary effort is normal.     Breath sounds: Normal breath sounds.  Musculoskeletal:     Comments: Trace edema in both ankles  Neurological:     Mental Status: He is alert.           Assessment & Plan:  He has some very mild ankle edema, and I think this is the result of taking daily Prednisone. He seems to tolerate the swelling just fine and he does not want to stop the Prednisone, so we agreed to keep things as they are. We will monitor this closely, and he will return if the swelling increases or if he notices any more SOB than usual. Alysia Penna, MD

## 2021-06-03 ENCOUNTER — Emergency Department (HOSPITAL_COMMUNITY): Payer: Medicare PPO

## 2021-06-03 ENCOUNTER — Other Ambulatory Visit: Payer: Self-pay

## 2021-06-03 ENCOUNTER — Inpatient Hospital Stay (HOSPITAL_COMMUNITY)
Admission: EM | Admit: 2021-06-03 | Discharge: 2021-06-06 | DRG: 853 | Disposition: A | Discharge status: Home / Self Care | Payer: Medicare PPO | Attending: Family Medicine | Admitting: Family Medicine

## 2021-06-03 ENCOUNTER — Encounter (HOSPITAL_COMMUNITY): Payer: Self-pay | Admitting: *Deleted

## 2021-06-03 DIAGNOSIS — E1122 Type 2 diabetes mellitus with diabetic chronic kidney disease: Secondary | ICD-10-CM | POA: Diagnosis present

## 2021-06-03 DIAGNOSIS — Z681 Body mass index (BMI) 19 or less, adult: Secondary | ICD-10-CM

## 2021-06-03 DIAGNOSIS — R079 Chest pain, unspecified: Secondary | ICD-10-CM | POA: Diagnosis not present

## 2021-06-03 DIAGNOSIS — T424X5A Adverse effect of benzodiazepines, initial encounter: Secondary | ICD-10-CM | POA: Diagnosis present

## 2021-06-03 DIAGNOSIS — Z8249 Family history of ischemic heart disease and other diseases of the circulatory system: Secondary | ICD-10-CM

## 2021-06-03 DIAGNOSIS — A419 Sepsis, unspecified organism: Principal | ICD-10-CM | POA: Diagnosis present

## 2021-06-03 DIAGNOSIS — R0602 Shortness of breath: Secondary | ICD-10-CM

## 2021-06-03 DIAGNOSIS — Z7951 Long term (current) use of inhaled steroids: Secondary | ICD-10-CM

## 2021-06-03 DIAGNOSIS — G928 Other toxic encephalopathy: Secondary | ICD-10-CM | POA: Diagnosis not present

## 2021-06-03 DIAGNOSIS — K219 Gastro-esophageal reflux disease without esophagitis: Secondary | ICD-10-CM | POA: Diagnosis present

## 2021-06-03 DIAGNOSIS — Z7984 Long term (current) use of oral hypoglycemic drugs: Secondary | ICD-10-CM

## 2021-06-03 DIAGNOSIS — Z20822 Contact with and (suspected) exposure to covid-19: Secondary | ICD-10-CM | POA: Diagnosis not present

## 2021-06-03 DIAGNOSIS — E1165 Type 2 diabetes mellitus with hyperglycemia: Secondary | ICD-10-CM | POA: Diagnosis present

## 2021-06-03 DIAGNOSIS — I214 Non-ST elevation (NSTEMI) myocardial infarction: Secondary | ICD-10-CM | POA: Diagnosis not present

## 2021-06-03 DIAGNOSIS — N189 Chronic kidney disease, unspecified: Secondary | ICD-10-CM | POA: Diagnosis present

## 2021-06-03 DIAGNOSIS — Z8673 Personal history of transient ischemic attack (TIA), and cerebral infarction without residual deficits: Secondary | ICD-10-CM

## 2021-06-03 DIAGNOSIS — F419 Anxiety disorder, unspecified: Secondary | ICD-10-CM | POA: Diagnosis present

## 2021-06-03 DIAGNOSIS — E860 Dehydration: Secondary | ICD-10-CM | POA: Diagnosis present

## 2021-06-03 DIAGNOSIS — J189 Pneumonia, unspecified organism: Secondary | ICD-10-CM

## 2021-06-03 DIAGNOSIS — J449 Chronic obstructive pulmonary disease, unspecified: Secondary | ICD-10-CM | POA: Diagnosis not present

## 2021-06-03 DIAGNOSIS — I130 Hypertensive heart and chronic kidney disease with heart failure and stage 1 through stage 4 chronic kidney disease, or unspecified chronic kidney disease: Secondary | ICD-10-CM | POA: Diagnosis present

## 2021-06-03 DIAGNOSIS — E43 Unspecified severe protein-calorie malnutrition: Secondary | ICD-10-CM | POA: Diagnosis present

## 2021-06-03 DIAGNOSIS — J90 Pleural effusion, not elsewhere classified: Secondary | ICD-10-CM | POA: Diagnosis not present

## 2021-06-03 DIAGNOSIS — I2510 Atherosclerotic heart disease of native coronary artery without angina pectoris: Secondary | ICD-10-CM | POA: Diagnosis not present

## 2021-06-03 DIAGNOSIS — J441 Chronic obstructive pulmonary disease with (acute) exacerbation: Secondary | ICD-10-CM | POA: Diagnosis not present

## 2021-06-03 DIAGNOSIS — J9621 Acute and chronic respiratory failure with hypoxia: Secondary | ICD-10-CM | POA: Diagnosis not present

## 2021-06-03 DIAGNOSIS — I25810 Atherosclerosis of coronary artery bypass graft(s) without angina pectoris: Secondary | ICD-10-CM | POA: Diagnosis present

## 2021-06-03 DIAGNOSIS — F32A Depression, unspecified: Secondary | ICD-10-CM | POA: Diagnosis present

## 2021-06-03 DIAGNOSIS — I25110 Atherosclerotic heart disease of native coronary artery with unstable angina pectoris: Secondary | ICD-10-CM | POA: Diagnosis present

## 2021-06-03 DIAGNOSIS — N400 Enlarged prostate without lower urinary tract symptoms: Secondary | ICD-10-CM | POA: Diagnosis present

## 2021-06-03 DIAGNOSIS — Z951 Presence of aortocoronary bypass graft: Secondary | ICD-10-CM

## 2021-06-03 DIAGNOSIS — G4700 Insomnia, unspecified: Secondary | ICD-10-CM | POA: Diagnosis present

## 2021-06-03 DIAGNOSIS — J440 Chronic obstructive pulmonary disease with acute lower respiratory infection: Secondary | ICD-10-CM | POA: Diagnosis present

## 2021-06-03 DIAGNOSIS — I499 Cardiac arrhythmia, unspecified: Secondary | ICD-10-CM | POA: Diagnosis not present

## 2021-06-03 DIAGNOSIS — R0789 Other chest pain: Secondary | ICD-10-CM | POA: Diagnosis not present

## 2021-06-03 DIAGNOSIS — N179 Acute kidney failure, unspecified: Secondary | ICD-10-CM | POA: Diagnosis present

## 2021-06-03 DIAGNOSIS — I5042 Chronic combined systolic (congestive) and diastolic (congestive) heart failure: Secondary | ICD-10-CM | POA: Diagnosis present

## 2021-06-03 DIAGNOSIS — Z7982 Long term (current) use of aspirin: Secondary | ICD-10-CM

## 2021-06-03 DIAGNOSIS — I248 Other forms of acute ischemic heart disease: Secondary | ICD-10-CM | POA: Diagnosis not present

## 2021-06-03 DIAGNOSIS — I5022 Chronic systolic (congestive) heart failure: Secondary | ICD-10-CM | POA: Diagnosis not present

## 2021-06-03 DIAGNOSIS — I447 Left bundle-branch block, unspecified: Secondary | ICD-10-CM | POA: Diagnosis not present

## 2021-06-03 DIAGNOSIS — Z79899 Other long term (current) drug therapy: Secondary | ICD-10-CM

## 2021-06-03 DIAGNOSIS — E785 Hyperlipidemia, unspecified: Secondary | ICD-10-CM | POA: Diagnosis present

## 2021-06-03 DIAGNOSIS — Z7902 Long term (current) use of antithrombotics/antiplatelets: Secondary | ICD-10-CM

## 2021-06-03 DIAGNOSIS — M109 Gout, unspecified: Secondary | ICD-10-CM | POA: Diagnosis present

## 2021-06-03 DIAGNOSIS — F17210 Nicotine dependence, cigarettes, uncomplicated: Secondary | ICD-10-CM | POA: Diagnosis present

## 2021-06-03 LAB — I-STAT VENOUS BLOOD GAS, ED
Acid-Base Excess: 2 mmol/L (ref 0.0–2.0)
Bicarbonate: 26.3 mmol/L (ref 20.0–28.0)
Calcium, Ion: 1.09 mmol/L — ABNORMAL LOW (ref 1.15–1.40)
HCT: 42 % (ref 39.0–52.0)
Hemoglobin: 14.3 g/dL (ref 13.0–17.0)
O2 Saturation: 71 %
Potassium: 4.2 mmol/L (ref 3.5–5.1)
Sodium: 139 mmol/L (ref 135–145)
TCO2: 27 mmol/L (ref 22–32)
pCO2, Ven: 38.6 mmHg — ABNORMAL LOW (ref 44.0–60.0)
pH, Ven: 7.441 — ABNORMAL HIGH (ref 7.250–7.430)
pO2, Ven: 35 mmHg (ref 32.0–45.0)

## 2021-06-03 LAB — BASIC METABOLIC PANEL
Anion gap: 10 (ref 5–15)
BUN: 20 mg/dL (ref 8–23)
CO2: 26 mmol/L (ref 22–32)
Calcium: 8.9 mg/dL (ref 8.9–10.3)
Chloride: 102 mmol/L (ref 98–111)
Creatinine, Ser: 1.37 mg/dL — ABNORMAL HIGH (ref 0.61–1.24)
GFR, Estimated: 55 mL/min — ABNORMAL LOW (ref >60)
Glucose, Bld: 144 mg/dL — ABNORMAL HIGH (ref 70–99)
Potassium: 4.3 mmol/L (ref 3.5–5.1)
Sodium: 138 mmol/L (ref 135–145)

## 2021-06-03 LAB — CBC WITH DIFFERENTIAL/PLATELET
Abs Immature Granulocytes: 0.08 10*3/uL — ABNORMAL HIGH (ref 0.00–0.07)
Basophils Absolute: 0.1 10*3/uL (ref 0.0–0.1)
Basophils Relative: 0 %
Eosinophils Absolute: 0.1 10*3/uL (ref 0.0–0.5)
Eosinophils Relative: 1 %
HCT: 42.1 % (ref 39.0–52.0)
Hemoglobin: 13.7 g/dL (ref 13.0–17.0)
Immature Granulocytes: 1 %
Lymphocytes Relative: 4 %
Lymphs Abs: 0.6 10*3/uL — ABNORMAL LOW (ref 0.7–4.0)
MCH: 31.5 pg (ref 26.0–34.0)
MCHC: 32.5 g/dL (ref 30.0–36.0)
MCV: 96.8 fL (ref 80.0–100.0)
Monocytes Absolute: 1.8 10*3/uL — ABNORMAL HIGH (ref 0.1–1.0)
Monocytes Relative: 10 %
Neutro Abs: 14.8 10*3/uL — ABNORMAL HIGH (ref 1.7–7.7)
Neutrophils Relative %: 84 %
Platelets: 266 10*3/uL (ref 150–400)
RBC: 4.35 MIL/uL (ref 4.22–5.81)
RDW: 14.7 % (ref 11.5–15.5)
WBC: 17.4 10*3/uL — ABNORMAL HIGH (ref 4.0–10.5)
nRBC: 0 % (ref 0.0–0.2)

## 2021-06-03 LAB — BRAIN NATRIURETIC PEPTIDE: B Natriuretic Peptide: 296.3 pg/mL — ABNORMAL HIGH (ref 0.0–100.0)

## 2021-06-03 LAB — RESP PANEL BY RT-PCR (FLU A&B, COVID) ARPGX2
Influenza A by PCR: NEGATIVE
Influenza B by PCR: NEGATIVE
SARS Coronavirus 2 by RT PCR: NEGATIVE

## 2021-06-03 LAB — TROPONIN I (HIGH SENSITIVITY)
Troponin I (High Sensitivity): 2767 ng/L (ref <18)
Troponin I (High Sensitivity): 3942 ng/L (ref <18)

## 2021-06-03 MED ORDER — ASPIRIN EC 325 MG PO TBEC: 325.0000 mg | DELAYED_RELEASE_TABLET | Freq: Once | ORAL | Status: DC

## 2021-06-03 MED ORDER — HEPARIN BOLUS VIA INFUSION
3500.0000 [IU] | Freq: Once | INTRAVENOUS | Status: AC
  Administered 2021-06-03: 3500 [IU] via INTRAVENOUS
  Filled 2021-06-03: qty 3500

## 2021-06-03 MED ORDER — ASPIRIN 81 MG PO CHEW: 324.0000 mg | CHEWABLE_TABLET | Freq: Once | ORAL | Status: DC

## 2021-06-03 MED ORDER — HEPARIN (PORCINE) 25000 UT/250ML-% IV SOLN
900.0000 [IU]/h | INTRAVENOUS | Status: DC
  Administered 2021-06-03: 750 [IU]/h via INTRAVENOUS
  Filled 2021-06-03: qty 250

## 2021-06-03 MED ORDER — PREDNISONE 20 MG PO TABS
60.0000 mg | ORAL_TABLET | Freq: Once | ORAL | Status: AC
  Administered 2021-06-03: 60 mg via ORAL
  Filled 2021-06-03: qty 3

## 2021-06-03 MED ORDER — ROSUVASTATIN CALCIUM 20 MG PO TABS
20.0000 mg | ORAL_TABLET | Freq: Every day | ORAL | Status: DC
  Administered 2021-06-04 – 2021-06-06 (×4): 20 mg via ORAL
  Filled 2021-06-03 (×4): qty 1

## 2021-06-03 MED ORDER — SIMVASTATIN 20 MG PO TABS: 40.0000 mg | ORAL_TABLET | Freq: Every day | ORAL | Status: DC

## 2021-06-03 MED ORDER — ASPIRIN 81 MG PO CHEW
324.0000 mg | CHEWABLE_TABLET | Freq: Once | ORAL | Status: AC
  Administered 2021-06-03: 324 mg via ORAL
  Filled 2021-06-03: qty 4

## 2021-06-03 MED ORDER — NITROGLYCERIN 0.4 MG SL SUBL: 0.4000 mg | SUBLINGUAL_TABLET | SUBLINGUAL | Status: DC | PRN

## 2021-06-03 MED ORDER — HEPARIN (PORCINE) 25000 UT/250ML-% IV SOLN: 12.0000 [IU]/kg/h | INTRAVENOUS | Status: DC

## 2021-06-03 MED ORDER — ASPIRIN EC 81 MG PO TBEC: 81.0000 mg | DELAYED_RELEASE_TABLET | Freq: Every day | ORAL | Status: DC

## 2021-06-03 MED ORDER — LACTATED RINGERS IV BOLUS
500.0000 mL | Freq: Once | INTRAVENOUS | Status: AC
  Administered 2021-06-03: 500 mL via INTRAVENOUS

## 2021-06-03 MED ORDER — LACTATED RINGERS IV SOLN: INTRAVENOUS | Status: AC

## 2021-06-03 MED ORDER — ONDANSETRON HCL 4 MG/2ML IJ SOLN: 4.0000 mg | Freq: Four times a day (QID) | INTRAMUSCULAR | Status: DC | PRN

## 2021-06-03 MED ORDER — HEPARIN SODIUM (PORCINE) 5000 UNIT/ML IJ SOLN
60.0000 [IU]/kg | Freq: Once | INTRAMUSCULAR | Status: DC
  Filled 2021-06-03: qty 1

## 2021-06-03 MED ORDER — ACETAMINOPHEN 325 MG PO TABS: 650.0000 mg | ORAL_TABLET | ORAL | Status: DC | PRN

## 2021-06-03 MED ORDER — SODIUM CHLORIDE 0.9% FLUSH
3.0000 mL | Freq: Two times a day (BID) | INTRAVENOUS | Status: DC
  Administered 2021-06-05 – 2021-06-06 (×2): 3 mL via INTRAVENOUS

## 2021-06-03 MED ORDER — SODIUM CHLORIDE 0.9 % IV SOLN
2.0000 g | Freq: Once | INTRAVENOUS | Status: AC
  Administered 2021-06-03: 2 g via INTRAVENOUS
  Filled 2021-06-03: qty 20

## 2021-06-03 MED ORDER — SODIUM CHLORIDE 0.9 % IV SOLN
500.0000 mg | Freq: Once | INTRAVENOUS | Status: AC
  Administered 2021-06-03: 500 mg via INTRAVENOUS
  Filled 2021-06-03: qty 500

## 2021-06-03 NOTE — ED Notes (Signed)
Pt placed on zoll with STEMI pads

## 2021-06-03 NOTE — ED Notes (Signed)
I was told by cardiology, pt not having STEMI, so I removed pt from zoll

## 2021-06-03 NOTE — H&P (View-Only) (Signed)
Cardiology Consultation:   Patient ID: Wayne Williams MRN: [id number]; DOB: [birth year]  Admit date: 06/03/2021 Date of Consult: 06/03/2021  PCP:  Laurey Morale, MD   [place] Prov[facility] Cardiologist:  Dr. Percival Spanish in 2016  Patient Profile:   Wayne Williams is a 70 y.o. male with a hx of CAD s/p CABG 5V in 2001, heart failure reduced EF, none oxygen dependent COPD, active tobacco use, anxiety, CVA, type 2 diabetes, hypertension, BPH, who is being seen 06/03/2021 for the evaluation of elevated troponin and abnormal EKG at the request of Dr. Almyra Free.   History of Present Illness:   Wayne Williams was seen by Dr. Percival Spanish in 2015 and  2016 in the office for CAD.  Due to chest pain, he underwent left heart catheterization on 01/24/2015, noted to have triple-vessel CAD s/p 5 vessel CABG with 1/5 patent bypass grafts (SVG to OM occluded, SVG to diagonal occluded, SVG sequential to distal RCA and PDA occluded, LIMA to mid LAD patent), moderate nonobstructive disease in mid RCA and mid circumflex (not flow-limiting), mild LV systolic dysfunction with EF 40%.  He was recommended medical management for CAD at the time.  He was last seen on 02/14/2015 in the office, complained of some exertional chest discomfort without resting angina, revascularization was felt not an option, his Imdur was increased 220 mg daily. He has not followed up with cardiology since.   Patient presented to the ER today complaining of shortness of breath.  His daughter is at bedside assisting history.  Daughter states patient has COPD, over the past several days, he has been having worsening coughing, shortness of breath, felt his COPD is in exacerbation.  Patient states he continues to smoke occasionally. He is not on oxygen at baseline and is placed on nasal cannula at ED. he noticed himself wheezing and more short of breath at rest, symptom not improving with home bronchodilator.  He reports a few minutes onset of right lower rib pain,  that resolved spontaneously.  He denied ever having midsternal or left-sided chest pain/tightness/pressure.  He denied any dizziness, nausea, vomiting, paresthesia, syncope, arm pain.  Daughter states his blood pressure is usually low normal.  Patient states his heart rate is typically fast > 100s but not 120-130s.   STEMI code was called by ER, upon evaluation patient clinically and previous EKG comparison, STEMI code was canceled.   Diagnostic work-up at admission revealed VBG pH 7.44.  BMP with creatinine 1.37, GFR 55, otherwise unremarkable.  Ionized calcium 1.09.  CBC revealed leukocytosis with WBC 17 400.  Flu and COVID-negative.Chest x-ray revealed minimal ill-defined opacity of right lung base concerning for atelectasis versus pneumonia, chronic hyperinflation and bronchial thickening consistent with COPD, blunting of costophrenic angles.  EKG revealed sinus tachycardia with ventricular rate of 131, LAD, LVH with repolarization abnormality, IVCD, similar to EKG from 03/05/2021.  BNP 296. HS troponin 2767 >3942.  He was given 324mg  ASA and started on heparin gtt at ED for NSTEMI. He was also started on antibiotic and steroids for pneumonia and COPD exacerbation.  Cardiology is consulted for further input.    Past Medical History:  Diagnosis Date  . Anxiety   . CAD (coronary artery disease)    s/p cabg x 4  . Cerebrovascular accident ([place])    hx of 06 20 09  . Depression    saw Dr. Casimiro Needle in the past  . Diabetes mellitus   . ED (erectile dysfunction)   . GERD (gastroesophageal reflux  disease)   . Hypertension   . Insomnia     Past Surgical History:  Procedure Laterality Date  . CERVICAL LAMINECTOMY  1996   per Dr. Hal Neer  . Heart bypass  2001  . LEFT HEART CATHETERIZATION WITH CORONARY/GRAFT ANGIOGRAM N/A 01/24/2015   Procedure: LEFT HEART CATHETERIZATION WITH Beatrix Fetters;  Surgeon: Burnell Blanks, MD;  Location[facility] C[facility] LAB;  Service: Cardiovascular;   Laterality: N/A;  . [place]   per Dr. Gladstone Lighter     Home Medications:  Prior to Admission medications   Medication Sig Start Date End Date Taking? Authorizing Provider  albuterol (VENTOLIN HFA) 108 (90 Base) MCG/ACT inhaler INHALE 2 PUFFS INTO THE LUNGS EVERY 4 (FOUR) HOURS AS NEEDED FOR WHEEZING OR SHORTNESS OF BREATH. 04/30/21   Laurey Morale, MD  ALPRAZolam Duanne Moron) 0.5 MG tablet TAKE 1 TABLET BY MOUTH EVERY 6 (SIX) HOURS AS NEEDED FOR ANXIETY. Patient taking differently: Take 0.5 mg by mouth at bedtime. 01/04/21   Laurey Morale, MD  aspirin 81 MG tablet Take 81 mg by mouth daily.    [provider]  Adair Patter [phone] MCG/INH AEPB INHALE 1 PUFF INTO THE LUNGS DAILY IN THE AFTERNOON. 03/23/21   Laurey Morale, MD  clopidogrel (PLAVIX) 75 MG tablet TAKE 1 TABLET BY MOUTH EVERY DAY 05/17/21   Laurey Morale, MD  Colchicine (MITIGARE) 0.6 MG CAPS Take 0.6 mg by mouth every 6 (six) hours as needed. Patient taking differently: Take 0.6 mg by mouth every 6 (six) hours as needed (gout flares). 08/24/20   Laurey Morale, MD  metFORMIN (GLUCOPHAGE) 1000 MG tablet TAKE 1 TABLET BY MOUTH TWICE A DAY Patient taking differently: Take 1,000 mg by mouth 2 (two) times daily with a meal. 12/24/20   Laurey Morale, MD  metoprolol succinate (TOPROL-XL) 50 MG 24 hr tablet TAKE 1 TABLET BY MOUTH DAILY WITH OR IMMEDIATELY FOLLOWING A MEAL. Patient taking differently: Take 50 mg by mouth daily. 12/03/20   Laurey Morale, MD  nitroGLYCERIN (NITROSTAT) 0.4 MG SL tablet Place 1 tablet (0.4 mg total) under the tongue every 5 (five) minutes as needed for chest pain. 08/20/20   Laurey Morale, MD  predniSONE (DELTASONE) 10 MG tablet Take 1 tablet (10 mg total) by mouth daily with breakfast. 04/02/21   Laurey Morale, MD  simvastatin (ZOCOR) 40 MG tablet TAKE 1 TABLET BY MOUTH EVERYDAY AT BEDTIME 04/30/21   Laurey Morale, MD  tamsulosin (FLOMAX) 0.4 MG CAPS capsule TAKE 1 CAPSULE BY MOUTH EVERY  DAY Patient taking differently: Take 0.4 mg by mouth daily. 03/01/21   Laurey Morale, MD  triamcinolone cream (KENALOG) 0.1 % Apply 1 application topically 2 (two) times daily as needed. Patient taking differently: Apply 1 application topically 2 (two) times daily as needed (rash). 06/22/20   Laurey Morale, MD    Inpatient Medications: Scheduled Meds: . heparin  3,500 Units Intravenous Once   Continuous Infusions: . azithromycin (ZITHROMAX) 500 MG IVPB (Vial-Mate [employer])    . heparin    . lactated ringers     PRN Meds:   Allergies:   No Known Allergies  Social History:   Social History   Socioeconomic History  . Marital status: Divorced    Spouse name: Not on file  . Number of children: Not on file  . Years of education: Not on file  . Highest education level: Not on file  Occupational History  . Occupation:  Retired   Tobacco Use  . Smoking status: Current Every Day Smoker    Packs/day: 1.00    Years: 50.00    Pack years: 50.00    Types: Cigarettes  . Smokeless tobacco: Never Used  Substance and Sexual Activity  . Alcohol use: No    Alcohol/week: 0.0 standard drinks  . Drug use: No  . Sexual activity: Not on file  Other Topics Concern  . Not on file  Social History Narrative   Disabled   Divorced, lives alone         Social Determinants of Health   Financial Resource Strain: Low Risk   . Difficulty of Paying Living Expenses: Not hard at all  Food Insecurity: No Food Insecurity  . Worried About Charity fundraiser in the Last Year: Never true  . Ran Out of Food in the Last Year: Never true  Transportation Needs: No Transportation Needs  . Lack of Transportation (Medical): No  . Lack of Transportation (Non-Medical): No  Physical Activity: Inactive  . Days of Exercise per Week: 0 days  . Minutes of Exercise per Session: 0 min  Stress: No Stress Concern Present  . Feeling of Stress : Not at all  Social Connections: Socially Isolated  . Frequency of  Communication with Friends and Family: Once a week  . Frequency of Social Gatherings with Friends and Family: Once a week  . Attends Religious Services: Never  . Active Member of Clubs or Organizations: No  . Attends Archivist Meetings: Never  . Marital Status: Divorced  Human resources officer Violence: Not At Risk  . Fear of Current or Ex-Partner: No  . Emotionally Abused: No  . Physically Abused: No  . Sexually Abused: No    Family History:    Family History  Problem Relation Age of Onset  . Coronary artery disease Other   . Hypertension Other      ROS:  Constitutional: see HPI  Eyes: Denied vision change or loss Ears/Nose/Mouth/Throat: Denied ear ache, sore throat, sinus pain Cardiovascular: see HPI  Respiratory: see HPI  Gastrointestinal: Denied nausea, vomiting, abdominal pain, diarrhea Genital/Urinary: Denied dysuria, hematuria, urinary frequency/urgency Musculoskeletal: Denied muscle ache, joint pain, weakness Skin: Denied rash, wound Neuro: Denied headache, dizziness, syncope Psych: history of depression/anxiety  Endocrine: history of diabetes      Physical Exam/Data:   Vitals:   06/03/21 1845 06/03/21 1900 06/03/21 1915 06/03/21 1936  BP: 123/71 127/78 130/71 126/71  Pulse: (!) 110 (!) 110 (!) 112 (!) 105  Resp: 20 20 (!) 21 20  Temp:    98.6 F (37 C)  TempSrc:    Oral  SpO2: 97% 97% 95% 98%  Weight:       No intake or output data in the 24 hours ending 06/03/21 2006 Last 3 Weights 06/03/2021 05/20/2021 04/02/2021  Weight (lbs) 128 lb 129 lb 128 lb 6.4 oz  Weight (kg) 58.06 kg 58.514 kg 58.242 kg     Body mass index is 17.36 kg/m.   General: Cachectic HEENT: EOMI intact, head atraumatic Neck: no JVD Cardiac:  normal S1, S2; RRR; no murmur  Lungs:  clear to auscultation bilaterally, scattered wheezing, on 2 L nasal cannula oxygen, speaks in full sentences Abd: soft, nontender, no hepatomegaly  Ext: no BLE edema Musculoskeletal:  No deformities,  BUE and BLE strength normal and equal Skin: warm and dry  Neuro: Alert and oriented x3, follow commands appropriately, carry conversation appropriately Psych:  Normal affect  EKG:  The EKG was personally reviewed and demonstrates: Sinus tachycardia with ventricular rate of 131 bpm, LAD, IVCD, LVH with associated repolarization abnormality  Telemetry:  Telemetry was personally reviewed and demonstrates: Sinus tachycardia  Relevant CV Studies:  Echocardiogram from 01/16/2015:  Left ventricle: Systolic function was moderately reduced. The  estimated ejection fraction was in the range of 35% to 40%. There  is hypokinesis of the anteroseptal myocardium. Doppler parameters  are consistent with abnormal left ventricular relaxation (grade 1  diastolic dysfunction).  - Aortic valve: There was mild regurgitation.  - Mitral valve: There was mild regurgitation.   Impressions:   - When compared to prior ECHO, EF has reduced and there appears to  be new anteroseptal infarct.    Left heart catheterization from 01/24/2015:   Left main: 30% ostial stenosis.   Left Anterior Descending Artery: Large caliber vessel that courses to the apex. 60% proximal stenosis leading into a moderate caliber septal perforating branch. 100% mid occlusion. The mid and distal vessel fills from the patent IMA graft.   Circumflex Artery: Large caliber vessel with one large obtuse marginal branch. The obtuse marginal branch is occluded at the ostium and fills from right to left collaterals. The mid Circumflex beyond the takeoff of the OM branch has a smooth 50% stenosis which does not appear to be flow limiting.   Right Coronary Artery: Large dominant vessel with mid 50% stenosis. There is diffuse mild plaque in the mid and distal vessel. The PDA is a small caliber vessel with diffuse 90% stenosis. The Posterolateral branch is a moderate caliber vessel with mild plaque.   Graft Anatomy:  SVG to OM is  occluded SVG to Diagonal is occluded SVG sequential to distal RCA and PDA is occluded LIMA to mid LAD is patent  Left Ventricular Angiogram: LVEF=40% with global hypokinesis, apical akinesis.   Impression: 1. Triple vessel CAD s/p 5V CABG with 1/5 patent bypass grafts 2. Moderate non-obstructive disease in the mid RCA and mid Circumflex, neither of which appears to be flow limiting 3. Mild LV systolic dysfunction    Laboratory Data:  High Sensitivity Troponin:   Recent Labs  Lab 06/03/21 1639 06/03/21 1820  TROPONINIHS 2,767* 3,942*     Chemistry Recent Labs  Lab 06/03/21 1613 06/03/21 1634  NA 138 139  K 4.3 4.2  CL 102  --   CO2 26  --   GLUCOSE 144*  --   BUN 20  --   CREATININE 1.37*  --   CALCIUM 8.9  --   GFRNONAA 55*  --   ANIONGAP 10  --     No results for input(s): PROT, ALBUMIN, AST, ALT, ALKPHOS, BILITOT in the last 168 hours. Hematology Recent Labs  Lab 06/03/21 1613 06/03/21 1634  WBC 17.4*  --   RBC 4.35  --   HGB 13.7 14.3  HCT 42.1 42.0  MCV 96.8  --   MCH 31.5  --   MCHC 32.5  --   RDW 14.7  --   PLT 266  --    BNP Recent Labs  Lab 06/03/21 1639  BNP 296.3*    DDimer No results for input(s): DDIMER in the last 168 hours.   Radiology/Studies:  DG Chest Portable 1 View  Result Date: 06/03/2021 CLINICAL DATA:  Chest pain.  COPD exacerbation. EXAM: PORTABLE CHEST 1 VIEW COMPARISON:  Most recent radiograph 03/02/2021. FINDINGS: Post median sternotomy and CABG. Normal heart size. Aortic atherosclerosis. Chronic hyperinflation and bronchial thickening.  Blunting of the left greater than right costophrenic angle, similar to prior exam. Minimal ill-defined opacity at the right lung base. No pneumothorax. No pulmonary edema. No acute osseous abnormalities are seen. IMPRESSION: 1. Minimal ill-defined opacity at the right lung base, atelectasis versus pneumonia. 2. Chronic hyperinflation and bronchial thickening, imaging findings consistent with  COPD. 3. Blunting of the costophrenic angles appear similar, may be due to hyperinflation versus small effusions. Electronically Signed   By: Keith Rake M.D.   On: 06/03/2021 17:23     Assessment and Plan:   NSTEMI CAD -Patient presented with shortness of breath, coughing, wheezing, right lower rib pain lasted few minutes -EKG without acute ischemic change -High sensitive troponin elevated 2767 >3942 -He has known severe triple-vessel CAD with 1/5 patent graft LIMA to LAD from cardiac cath 2016, was recommended medical management at the time -Started on a heparin infusion at ED,  OK to continue -will resume aspirin 81 daily and simvastatin 40 mg daily -Okay to hold metoprolol in the setting of COPD exacerbation -Plan for cardiac cath tomorrow, n.p.o. after midnight  Acute hypoxic respiratory failure Community-acquired pneumonia COPD exacerbation -Treated per primary team  AKI -Creatinine baseline appears 1-1.19, 1.37 POA - will initiate gentle hydration before cath with LR at 164ml.hr for 12 hours  - repeat BMP in AM -Avoid nephrotoxin, hold metformin  Tobacco abuse -Discussed the need of absolute cessation  Type 2 diabetes -We will check hemoglobin A1c -Management per primary team  Hyperlipidemia -will check lipid panel    Risk Assessment/Risk Scores:   TIMI Risk Score for Unstable Angina or Non-ST Elevation MI:   The patient's TIMI risk score is 5, which indicates a 26% risk of all cause mortality, new or recurrent myocardial infarction or need for urgent revascularization in the next 14 days  New York Heart Association (NYHA) Functional Class NYHA Class II        For questions or updates, please contact [facility] Please consult [email or web address] for contact info under    Signed, Margie Billet, NP  06/03/2021 8:06 PM   Patient seen and examined and agree with Lance Sell, NP as detailed above.  In brief, the patient is a 70 y.o. male with a hx of CAD s/p  CABG 5V in 2001, HFrEF(35-40%), COPD, active tobacco use, anxiety, CVA, type 2 diabetes, hypertension, and BPH who presented with SOB found to have ECG changes with initial concern for STEMI which was cancelled earlier in the day as tracings similar to prior now found to have elevated troponin for which cardiology has been consulted.  Patient has history of known severe multivessel CAD. Last cath on 01/24/2015 showed severe native triple-vessel CAD with only patent LIMA-to LAD (SVG to OM occluded, SVG to diagonal occluded, SVG sequential to distal RCA and PDA occluded. There was moderate nonobstructive disease in mid RCA and mid circumflex (not flow-limiting), mild LV systolic dysfunction with EF 40%.  He was recommended medical management for CAD at the time.  He was last seen on 02/14/2015 in the office, where he complained of some exertional chest discomfort without resting angina, revascularization was felt not an option, and his Imdur was increased 220 mg daily. He has not followed up with cardiology since.   Patient presented to ER today with SOB. ECG on arrival showed sinus tachycardia with ventricular rate of 131 bpm, LAD, IVCD, LVH with strain. Was initially paged out as a STEMI which was cancelled as tracing similar to prior. ER work-up notable for  CXR concerning for possibly PNA for which he was started on ABX as well as steroids for COPD exacerbation. Trop returned 2767-->3942 which prompted Cardiology consult.  Currently, the patient states his breathing is improved. No chest pain. Appears dry on examination.   GEN: Thin, elderly male who appears older than stated age Neck: No JVD Cardiac: Tachycardic, regular, no murmurs Respiratory: Diminished but no wheezes GI: Soft, nontender, non-distended  MS: Thin, no edema Neuro:  Nonfocal  Psych: Normal affect    Possibly demand ischemia triggered by COPD/pneumonia in the setting of known severe CAD and underlying HFrEF vs ACS. Currently patient  is chest pain free with improving dyspnea. Clinically dry on exam. TTE pending. Given rising troponin and known disease, will keep NPO for possible cath tomorrow.  Plan: -NPO at MN for possible cath in the AM  -Continue heparin gtt -Repeat TTE to assess LVEF; appears dry with no evidence of acute volume overload -Continue ASA 81mg  daily -Change simvastatin to crestor 20mg  daily -Holding BB in the setting of acute COPD exacerbation -Will add GDMT as tolerated for HFrEF (entresto, spiro, farxiga etc) -Management of PNA per primary team  Plan discussed with the patient and his daughter at length at bedside.  INFORMED CONSENT: I have reviewed the risks, indications, and alternatives to cardiac catheterization, possible angioplasty, and stenting with the patient. Risks include but are not limited to bleeding, infection, vascular injury, stroke, myocardial infection, arrhythmia, kidney injury, radiation-related injury in the case of prolonged fluoroscopy use, emergency cardiac surgery, and death. The patient understands the risks of serious complication is 1-2 in 3005 with diagnostic cardiac cath and 1-2% or less with angioplasty/stenting.   Gwyndolyn Kaufman, MD

## 2021-06-03 NOTE — ED Notes (Signed)
Attempted to give report and no one is answering phone.

## 2021-06-03 NOTE — ED Notes (Signed)
[facility] called to activate STEMi per Dr.P [facility]

## 2021-06-03 NOTE — Progress Notes (Signed)
   06/03/21 1645  Clinical Encounter Type  Visited With Patient not available  Visit Type ED  Referral From Nurse  Consult/Referral To Chaplain  Chaplain responded to code stemi page.The patient's NP is at the bedside, and the family is not present. The chaplain remains available. This note was prepared by Jeanine Luz, M.Div..  For questions please contact by phone [phone].

## 2021-06-03 NOTE — H&P (Signed)
History and Physical  Wayne Williams UMP:[id number] DOB: June 01, 1951 DOA: 06/03/2021  Referring physician: Dr. Jenean Lindau, [place] PCP: Laurey Morale, MD  Outpatient Specialists: Cardiology. Patient coming from: Home.  Chief Complaint: Shortness of breath, productive cough.  HPI: Wayne Williams is a 70 y.o. male with medical history significant for coronary artery disease status post CABG, COPD, not on oxygen supplementation at baseline, ongoing tobacco use disorder, essential hypertension, prior CVA, type 2 diabetes, chronic depression/anxiety, BPH, gout, who presented from home due to worsening shortness of breath at rest and with minimal exertion, productive cough of 5 days duration.  Dyspnea not improved with home bronchodilator.  Associated with transient right lower rib pain, sharp, lasting 5 to 10 minutes, and resolved spontaneously.  States he had no pain with previous diagnosis of MI.  Denies midsternal or left-sided chest pain, tightness, or pressure.  No nausea or vomiting.  Patient presented to the ED for further evaluation.  While in the ED, STEMI code was called.  Patient was evaluated by cardiology who reviewed twelve-lead EKG, consequently STEMI code was canceled.  Work-up in the ED revealed elevated troponin greater than 3000 with concern for NSTEMI.  Patient was started on heparin drip and cardiology is planning heart cath in the morning.  TRH, hospitalist team, was asked to admit.  ED Course:  Temperature 98.5.  BP 91/53, MAP 66, pulse 100, respiration rate 19, O2 saturation 97% on 2 L nasal cannula.  Review of Systems: Review of systems as noted in the HPI. All other systems reviewed and are negative.   Past Medical History:  Diagnosis Date  . Anxiety   . CAD (coronary artery disease)    s/p cabg x 4  . Cerebrovascular accident ([place])    hx of 06 20 09  . Depression    saw Dr. Casimiro Needle in the past  . Diabetes mellitus   . ED (erectile dysfunction)   . GERD (gastroesophageal  reflux disease)   . Hypertension   . Insomnia    Past Surgical History:  Procedure Laterality Date  . CERVICAL LAMINECTOMY  1996   per Dr. Hal Neer  . Heart bypass  2001  . LEFT HEART CATHETERIZATION WITH CORONARY/GRAFT ANGIOGRAM N/A 01/24/2015   Procedure: LEFT HEART CATHETERIZATION WITH Beatrix Fetters;  Surgeon: Burnell Blanks, MD;  Location: [facility] CATH LAB;  Service: Cardiovascular;  Laterality: N/A;  . [place]   per Dr. Gladstone Lighter    Social History:  reports that he has been smoking cigarettes. He has a 50.00 pack-year smoking history. He has never used smokeless tobacco. He reports that he does not drink alcohol and does not use drugs.   No Known Allergies  Family History  Problem Relation Age of Onset  . Coronary artery disease Other   . Hypertension Other       Prior to Admission medications   Medication Sig Start Date End Date Taking? Authorizing Provider  albuterol (VENTOLIN HFA) 108 (90 Base) MCG/ACT inhaler INHALE 2 PUFFS INTO THE LUNGS EVERY 4 (FOUR) HOURS AS NEEDED FOR WHEEZING OR SHORTNESS OF BREATH. 04/30/21   Laurey Morale, MD  ALPRAZolam Duanne Moron) 0.5 MG tablet TAKE 1 TABLET BY MOUTH EVERY 6 (SIX) HOURS AS NEEDED FOR ANXIETY. Patient taking differently: Take 0.5 mg by mouth at bedtime. 01/04/21   Laurey Morale, MD  aspirin 81 MG tablet Take 81 mg by mouth daily.    [provider]  BREO ELLIPTA 200-25 MCG/INH AEPB INHALE 1 PUFF  INTO THE LUNGS DAILY IN THE AFTERNOON. 03/23/21   Laurey Morale, MD  clopidogrel (PLAVIX) 75 MG tablet TAKE 1 TABLET BY MOUTH EVERY DAY 05/17/21   Laurey Morale, MD  Colchicine (MITIGARE) 0.6 MG CAPS Take 0.6 mg by mouth every 6 (six) hours as needed. Patient taking differently: Take 0.6 mg by mouth every 6 (six) hours as needed (gout flares). 08/24/20   Laurey Morale, MD  metFORMIN (GLUCOPHAGE) 1000 MG tablet TAKE 1 TABLET BY MOUTH TWICE A DAY Patient taking differently: Take 1,000 mg by mouth 2 (two)  times daily with a meal. 12/24/20   Laurey Morale, MD  metoprolol succinate (TOPROL-XL) 50 MG 24 hr tablet TAKE 1 TABLET BY MOUTH DAILY WITH OR IMMEDIATELY FOLLOWING A MEAL. Patient taking differently: Take 50 mg by mouth daily. 12/03/20   Laurey Morale, MD  nitroGLYCERIN (NITROSTAT) 0.4 MG SL tablet Place 1 tablet (0.4 mg total) under the tongue every 5 (five) minutes as needed for chest pain. 08/20/20   Laurey Morale, MD  predniSONE (DELTASONE) 10 MG tablet Take 1 tablet (10 mg total) by mouth daily with breakfast. 04/02/21   Laurey Morale, MD  simvastatin (ZOCOR) 40 MG tablet TAKE 1 TABLET BY MOUTH EVERYDAY AT BEDTIME 04/30/21   Laurey Morale, MD  tamsulosin (FLOMAX) 0.4 MG CAPS capsule TAKE 1 CAPSULE BY MOUTH EVERY DAY Patient taking differently: Take 0.4 mg by mouth daily. 03/01/21   Laurey Morale, MD  triamcinolone cream (KENALOG) 0.1 % Apply 1 application topically 2 (two) times daily as needed. Patient taking differently: Apply 1 application topically 2 (two) times daily as needed (rash). 06/22/20   Laurey Morale, MD    Physical Exam: BP (!) 91/53   Pulse 100   Temp 98.6 F (37 C) (Oral)   Resp 19   Wt 58.1 kg   SpO2 97%   BMI 17.36 kg/m   . General: 70 y.o. year-old male well developed well nourished in no acute distress.  Alert and oriented x3.  Mild conversational dyspnea noted. . Cardiovascular: Regular rate and rhythm with no rubs or gallops.  No thyromegaly or JVD noted.  No lower extremity edema.  Marland Kitchen Respiratory: Mild diffuse wheezing noted. Good inspiratory effort. . Abdomen: Soft nontender nondistended with normal bowel sounds x4 quadrants. . Muskuloskeletal: No cyanosis, clubbing or edema noted bilaterally . Neuro: CN II-XII intact, strength, sensation, reflexes . Skin: No ulcerative lesions noted or rashes . Psychiatry: Judgement and insight appear normal. Mood is appropriate for condition and setting          Labs on Admission:  Basic Metabolic Panel: Recent Labs   Lab 06/03/21 1613 06/03/21 1634  NA 138 139  K 4.3 4.2  CL 102  --   CO2 26  --   GLUCOSE 144*  --   BUN 20  --   CREATININE 1.37*  --   CALCIUM 8.9  --    Liver Function Tests: No results for input(s): AST, ALT, ALKPHOS, BILITOT, PROT, ALBUMIN in the last 168 hours. No results for input(s): LIPASE, AMYLASE in the last 168 hours. No results for input(s): AMMONIA in the last 168 hours. CBC: Recent Labs  Lab 06/03/21 1613 06/03/21 1634  WBC 17.4*  --   NEUTROABS 14.8*  --   HGB 13.7 14.3  HCT 42.1 42.0  MCV 96.8  --   PLT 266  --    Cardiac Enzymes: No results for input(s): CKTOTAL, CKMB, CKMBINDEX,  TROPONINI in the last 168 hours.  BNP (last 3 results) Recent Labs    03/02/21 0538 06/03/21 1639  BNP 204.8* 296.3*    ProBNP (last 3 results) No results for input(s): PROBNP in the last 8760 hours.  CBG: No results for input(s): GLUCAP in the last 168 hours.  Radiological Exams on Admission: DG Chest Portable 1 View  Result Date: 06/03/2021 CLINICAL DATA:  Chest pain.  COPD exacerbation. EXAM: PORTABLE CHEST 1 VIEW COMPARISON:  Most recent radiograph 03/02/2021. FINDINGS: Post median sternotomy and CABG. Normal heart size. Aortic atherosclerosis. Chronic hyperinflation and bronchial thickening. Blunting of the left greater than right costophrenic angle, similar to prior exam. Minimal ill-defined opacity at the right lung base. No pneumothorax. No pulmonary edema. No acute osseous abnormalities are seen. IMPRESSION: 1. Minimal ill-defined opacity at the right lung base, atelectasis versus pneumonia. 2. Chronic hyperinflation and bronchial thickening, imaging findings consistent with COPD. 3. Blunting of the costophrenic angles appear similar, may be due to hyperinflation versus small effusions. Electronically Signed   By: Keith Rake M.D.   On: 06/03/2021 17:23    EKG: I independently viewed the EKG done and my findings are as followed: Sinus tachycardia with  ventricular rate of 131, nonspecific ST-T changes.  QTc 541.  Assessment/Plan Present on Admission: . NSTEMI (non-ST elevated myocardial infarction) [facility])  Active Problems:   NSTEMI (non-ST elevated myocardial infarction) ([place])  NSTEMI Presented with worsening dyspnea for 5 days duration. History of coronary artery disease status post CABG Found to have elevated troponin greater than 3000. Seen by cardiology started on heparin drip. Plan for heart cath on 06/04/2021 Follow 2D echo, fasting lipid panel in the morning Started on pre-cath gentle IV fluid hydration by cardiology to avoid contrast-induced nephropathy Closely monitor in stepdown unit Management per cardiology.  COPD exacerbation Presented with worsening shortness of breath and productive cough for 5 days. Received 1 dose of azithromycin and Rocephin in the ED DC azithromycin due to prolonged QTC. Continue Rocephin, started on 06/03/2021. Bronchodilators Pulmonary toilet Has received a dose of prednisone 60 mg x 1.  We will hold off steroids for now in the setting of NSTEMI.  Acute hypoxic respiratory failure secondary to COPD exacerbation Not on oxygen supplementation at baseline Currently on 2 L to maintain O2 saturation greater than 90%. Wean off oxygen supplementation as tolerated Bronchodilators Pulmonary toilette  AKI likely prerenal in the setting of dehydration Baseline creatinine appears to be 1.0 with GFR greater than 60. Presented with creatinine of 1.37 with GFR 55 Avoid nephrotoxic agents and hypotension. Monitor urine output. On IV fluid hydration LR 75 cc/h x12 hours, per cardiology, precath Repeat BMP in the morning.  Combined diastolic and systolic CHF Last 2D echo done on 01/16/2015 revealed LVEF 35 to 40% with grade 1 diastolic dysfunction. Strict I's and O's and daily weight Not on goal directed medical therapy due to soft blood pressures. Management per cardiology.  Type 2 diabetes with  hyperglycemia Obtain hemoglobin A1c Start SSI scale Hold off home oral hypoglycemics. N.p.o. after midnight for planned heart cath in the morning.  Leukocytosis, possibly reactive in the setting of oral steroids He was on prednisone 10 mg daily. No clear evidence of pneumonia on chest x-ray. Will obtain a procalcitonin level Currently on Rocephin for COPD exacerbation.  Possible left pleural effusion Personally reviewed chest x-ray done on admission which shows possible left pleural effusion versus atelectasis Incentive spirometer Flutter valve Mobilize as tolerated if okay with cardiology.  DVT prophylaxis: Heparin drip  Code Status: Full code, per the patient himself.  Family Communication: None at bedside.  Disposition Plan: Admit to progressive unit  Consults called: Cardiology.  Admission status: Inpatient status.  Patient will require at least 2 midnights for further evaluation and treatment of present condition.   Status is: Inpatient    Dispo:  Patient From: Home  Planned Disposition: Home, possibly on 06/05/2021 or when cardiology signs off.  Medically stable for discharge: No, ongoing management of NSTEMI, acute hypoxic respiratory failure, COPD exacerbation.         Kayleen Memos MD [facility] Pager [phone]  If 7PM-7AM, please contact night-coverage [email or web address] Password [email or web address]  06/03/2021, 9:50 PM

## 2021-06-03 NOTE — ED Provider Notes (Addendum)
[facility]ERG[facility] DEPARTMENT Provider Note   CSN: [id number] Arrival date & time: 06/03/21  1551     History Chief Complaint  Patient presents with   Shortness of Breath   Chest Pain   sob/ STEMI    Wayne Williams is a 70 y.o. male.   Shortness of Breath Severity:  Moderate Onset quality:  Gradual Duration:  1 day Timing:  Constant Progression:  Worsening Chronicity:  Recurrent Context: not activity, not animal exposure, not emotional upset, not fumes, not known allergens, not occupational exposure, not pollens, not smoke exposure, not strong odors, not URI and not weather changes   Context comment:  History of COPD, severe Relieved by:  Nothing Worsened by:  Deep breathing, exertion and activity Ineffective treatments:  None tried Associated symptoms: chest pain, cough and wheezing   Associated symptoms: no abdominal pain, no claudication, no diaphoresis, no ear pain, no fever, no headaches, no hemoptysis, no neck pain, no PND, no rash, no sore throat, no sputum production, no syncope, no swollen glands and no vomiting   Risk factors comment:  H/o CAD and CHF Chest Pain Pain location:  L chest and R chest Pain quality: aching   Pain radiates to:  Does not radiate Pain severity:  Mild Onset quality:  Gradual Duration:  2 days Timing:  Constant Associated symptoms: cough and shortness of breath   Associated symptoms: no abdominal pain, no back pain, no claudication, no diaphoresis, no fever, no headache, no palpitations, no PND, no syncope and no vomiting       Past Medical History:  Diagnosis Date   Anxiety    CAD (coronary artery disease)    s/p cabg x 4   Cerebrovascular accident [facility])    hx of 06 20 09   Depression    saw Dr. Casimiro Needle in the past   Diabetes mellitus    ED (erectile dysfunction)    GERD (gastroesophageal reflux disease)    Hypertension    Insomnia     Patient Active Problem List   Diagnosis Date Noted   Severe  sepsis ([place]) 07/03/2021   Community acquired pneumonia 07/02/2021   PNA (pneumonia) 07/02/2021   AKI (acute kidney injury) ([place]) 06/12/2021   Protein-calorie malnutrition, severe 06/05/2021   NSTEMI (non-ST elevated myocardial infarction) ([place]) 06/03/2021   Ankle edema, bilateral 05/21/2021   COPD (chronic obstructive pulmonary disease) ([place]) 04/09/2020   Diabetes mellitus without complication ([place]) 67/11/4579   Cerebral artery occlusion with cerebral infarction ([place]) 08/22/2010   CONSTIPATION 08/19/2010   MICROSCOPIC HEMATURIA 08/19/2010   LOSS OF WEIGHT 08/19/2010   Hyperlipidemia 10/10/2009   BACK PAIN, LUMBAR 05/11/2009   Anxiety state 08/11/2008   Nicotine dependence 08/11/2008   DEPRESSION 08/11/2008   Essential hypertension 08/11/2008   MYOCARDIAL INFARCTION, HX OF 08/11/2008   Coronary atherosclerosis 08/11/2008   GERD 08/11/2008   CEREBROVASCULAR ACCIDENT, HX OF 08/11/2008    Past Surgical History:  Procedure Laterality Date   CERVICAL LAMINECTOMY  1996   per Dr. Hal Neer   CORONARY STENT INTERVENTION N/A 06/04/2021   Procedure: CORONARY STENT INTERVENTION;  Surgeon: Jettie Booze, MD;  Location: [facility];  Service: Cardiovascular;  Laterality: N/A;   Heart bypass  2001   INTRAVASCULAR ULTRASOUND/IVUS N/A 06/04/2021   Procedure: Intravascular Ultrasound/IVUS;  Surgeon: Jettie Booze, MD;  Location: [facility];  Service: Cardiovascular;  Laterality: N/A;   LEFT HEART CATH AND CORS/GRAFTS ANGIOGRAPHY N/A 06/04/2021   Procedure: LEFT HEART  CATH AND CORS/GRAFTS ANGIOGRAPHY;  Surgeon: Jettie Booze, MD;  Location: [facility];  Service: Cardiovascular;  Laterality: N/A;   LEFT HEART CATHETERIZATION WITH CORONARY/GRAFT ANGIOGRAM N/A 01/24/2015   Procedure: LEFT HEART CATHETERIZATION WITH Beatrix Fetters;  Surgeon: Burnell Blanks, MD;  Location: [facility] CATH LAB;  Service: Cardiovascular;  Laterality: N/A;   LUMBAR LAMINECTOMY   1995   per Dr. Gladstone Lighter       Family History  Problem Relation Age of Onset   Coronary artery disease Other    Hypertension Other     Social History   Tobacco Use   Smoking status: Every Day    Packs/day: 1.00    Years: 50.00    Pack years: 50.00    Types: Cigarettes   Smokeless tobacco: Never  Substance Use Topics   Alcohol use: No    Alcohol/week: 0.0 standard drinks   Drug use: No    Home Medications Prior to Admission medications   Medication Sig Start Date End Date Taking? Authorizing Provider  albuterol (VENTOLIN HFA) 108 (90 Base) MCG/ACT inhaler INHALE 2 PUFFS INTO THE LUNGS EVERY 4 (FOUR) HOURS AS NEEDED FOR WHEEZING OR SHORTNESS OF BREATH. 04/30/21  Yes Laurey Morale, MD  ALPRAZolam Duanne Moron) 0.5 MG tablet TAKE 1 TABLET BY MOUTH EVERY 6 (SIX) HOURS AS NEEDED FOR ANXIETY. Patient taking differently: No sig reported 01/04/21  Yes Laurey Morale, MD  aspirin 81 MG tablet Take 81 mg by mouth at bedtime.   Yes [provider]  B COMPLEX VITAMINS PO Take 1 capsule by mouth every morning.   Yes [provider]  BREO ELLIPTA 200-25 MCG/INH AEPB INHALE 1 PUFF INTO THE LUNGS DAILY IN THE AFTERNOON. Patient taking differently: Inhale 1 puff into the lungs daily as needed (sob). 03/23/21  Yes Laurey Morale, MD  clopidogrel (PLAVIX) 75 MG tablet TAKE 1 TABLET BY MOUTH EVERY DAY 05/17/21  Yes Laurey Morale, MD  metFORMIN (GLUCOPHAGE) 1000 MG tablet TAKE 1 TABLET BY MOUTH TWICE A DAY Patient taking differently: Take 1,000 mg by mouth in the morning and at bedtime. 12/24/20  Yes Laurey Morale, MD  metoprolol succinate (TOPROL-XL) 50 MG 24 hr tablet TAKE 1 TABLET BY MOUTH DAILY WITH OR IMMEDIATELY FOLLOWING A MEAL. 12/03/20  Yes Laurey Morale, MD  nitroGLYCERIN (NITROSTAT) 0.4 MG SL tablet Place 1 tablet (0.4 mg total) under the tongue every 5 (five) minutes as needed for chest pain. 08/20/20  Yes Laurey Morale, MD  tamsulosin (FLOMAX) 0.4 MG CAPS capsule TAKE 1  CAPSULE BY MOUTH EVERY DAY 03/01/21  Yes Laurey Morale, MD  varenicline (CHANTIX STARTING MONTH PAK) 0.5 MG X 11 & 1 MG X 42 tablet Take one 0.5 mg tablet by mouth once daily for 3 days, then increase to one 0.5 mg tablet twice daily for 4 days, then increase to one 1 mg tablet twice daily. Patient taking differently: Take 0.5 mg by mouth 2 (two) times daily. Take one 0.5 mg tablet by mouth once daily for 3 days, then increase to one 0.5 mg tablet twice daily for 4 days, then increase to one 1 mg tablet twice daily. 06/06/21  Yes Nita Sells, MD  albuterol (PROVENTIL) (2.5 MG/3ML) 0.083% nebulizer solution Take 3 mLs (2.5 mg total) by nebulization every 4 (four) hours as needed for wheezing or shortness of breath. 06/12/21   Laurey Morale, MD  bisoprolol (ZEBETA) 5 MG tablet Take 0.5 tablets (2.5 mg total)  by mouth daily. 06/07/21   Nita Sells, MD  Colchicine (MITIGARE) 0.6 MG CAPS Take 0.6 mg by mouth every 6 (six) hours as needed. Patient taking differently: Take 0.6 mg by mouth every 6 (six) hours as needed (gout). 08/24/20   Laurey Morale, MD  guaiFENesin (MUCINEX) 600 MG 12 hr tablet Take 2 tablets (1,200 mg total) by mouth 2 (two) times daily. Patient taking differently: Take 600 mg by mouth 2 (two) times daily. 06/06/21   Nita Sells, MD  rosuvastatin (CRESTOR) 20 MG tablet Take 1 tablet (20 mg total) by mouth daily. 06/07/21   Nita Sells, MD    Allergies    Patient has no known allergies.  Review of Systems   Review of Systems  Constitutional:  Negative for chills, diaphoresis and fever.  HENT:  Negative for ear pain and sore throat.   Eyes:  Negative for pain and visual disturbance.  Respiratory:  Positive for cough, shortness of breath and wheezing. Negative for hemoptysis and sputum production.   Cardiovascular:  Positive for chest pain. Negative for palpitations, claudication, syncope and PND.  Gastrointestinal:  Negative for abdominal pain and  vomiting.  Genitourinary:  Negative for dysuria and hematuria.  Musculoskeletal:  Negative for arthralgias, back pain and neck pain.  Skin:  Negative for color change and rash.  Neurological:  Negative for seizures, syncope and headaches.  All other systems reviewed and are negative.  Physical Exam Updated Vital Signs BP 123/62 (BP Location: Right Arm)   Pulse 84   Temp 97.6 F (36.4 C) (Axillary)   Resp 19   Ht 6' (1.829 m)   Wt 58.1 kg   SpO2 94%   BMI 17.36 kg/m   Physical Exam Vitals and nursing note reviewed.  Constitutional:      Appearance: He is underweight. He is ill-appearing. He is not toxic-appearing.     Interventions: Nasal cannula in place.  HENT:     Head: Normocephalic and atraumatic.  Eyes:     Conjunctiva/sclera: Conjunctivae normal.  Cardiovascular:     Rate and Rhythm: Regular rhythm. Tachycardia present.     Pulses: Normal pulses.     Heart sounds: Normal heart sounds. No murmur heard. Pulmonary:     Effort: Pulmonary effort is normal. Tachypnea present. No respiratory distress.     Breath sounds: Examination of the right-lower field reveals decreased breath sounds. Examination of the left-lower field reveals decreased breath sounds. Decreased breath sounds present.  Abdominal:     General: Abdomen is flat.     Palpations: Abdomen is soft.     Tenderness: There is no abdominal tenderness.  Musculoskeletal:     Cervical back: Neck supple.     Right lower leg: No edema.     Left lower leg: No edema.  Skin:    General: Skin is warm and dry.  Neurological:     Mental Status: He is alert.  Psychiatric:        Behavior: Behavior is cooperative.    ED Results / Procedures / Treatments   Labs (all labs ordered are listed, but only abnormal results are displayed) Labs Reviewed  CBC WITH DIFFERENTIAL/PLATELET - Abnormal; Notable for the following components:      Result Value   WBC 17.4 (*)    Neutro Abs 14.8 (*)    Lymphs Abs 0.6 (*)     Monocytes Absolute 1.8 (*)    Abs Immature Granulocytes 0.08 (*)    All other components within normal limits  BASIC METABOLIC PANEL - Abnormal; Notable for the following components:   Glucose, Bld 144 (*)    Creatinine, Ser 1.37 (*)    GFR, Estimated 55 (*)    All other components within normal limits  BRAIN NATRIURETIC PEPTIDE - Abnormal; Notable for the following components:   B Natriuretic Peptide 296.3 (*)    All other components within normal limits  PROTIME-INR - Abnormal; Notable for the following components:   Prothrombin Time 15.7 (*)    INR 1.3 (*)    All other components within normal limits  BASIC METABOLIC PANEL - Abnormal; Notable for the following components:   Glucose, Bld 235 (*)    BUN 32 (*)    Creatinine, Ser 1.45 (*)    Calcium 8.5 (*)    GFR, Estimated 51 (*)    All other components within normal limits  HEPARIN LEVEL (UNFRACTIONATED) - Abnormal; Notable for the following components:   Heparin Unfractionated <0.10 (*)    All other components within normal limits  CBC - Abnormal; Notable for the following components:   WBC 19.6 (*)    RBC 3.75 (*)    Hemoglobin 12.0 (*)    HCT 36.4 (*)    All other components within normal limits  CBC - Abnormal; Notable for the following components:   WBC 18.9 (*)    RBC 3.42 (*)    Hemoglobin 10.8 (*)    HCT 33.1 (*)    All other components within normal limits  BASIC METABOLIC PANEL - Abnormal; Notable for the following components:   Glucose, Bld 153 (*)    BUN 47 (*)    Creatinine, Ser 1.25 (*)    Calcium 8.6 (*)    All other components within normal limits  GLUCOSE, CAPILLARY - Abnormal; Notable for the following components:   Glucose-Capillary 168 (*)    All other components within normal limits  GLUCOSE, CAPILLARY - Abnormal; Notable for the following components:   Glucose-Capillary 173 (*)    All other components within normal limits  BRAIN NATRIURETIC PEPTIDE - Abnormal; Notable for the following  components:   B Natriuretic Peptide 723.8 (*)    All other components within normal limits  GLUCOSE, CAPILLARY - Abnormal; Notable for the following components:   Glucose-Capillary 174 (*)    All other components within normal limits  GLUCOSE, CAPILLARY - Abnormal; Notable for the following components:   Glucose-Capillary 155 (*)    All other components within normal limits  GLUCOSE, CAPILLARY - Abnormal; Notable for the following components:   Glucose-Capillary 151 (*)    All other components within normal limits  GLUCOSE, CAPILLARY - Abnormal; Notable for the following components:   Glucose-Capillary 153 (*)    All other components within normal limits  CBC - Abnormal; Notable for the following components:   WBC 13.8 (*)    RBC 3.28 (*)    Hemoglobin 10.3 (*)    HCT 31.7 (*)    All other components within normal limits  COMPREHENSIVE METABOLIC PANEL - Abnormal; Notable for the following components:   Glucose, Bld 64 (*)    BUN 38 (*)    Creatinine, Ser 1.40 (*)    Calcium 8.7 (*)    Total Protein 5.7 (*)    Albumin 2.9 (*)    AST 51 (*)    GFR, Estimated 53 (*)    All other components within normal limits  BRAIN NATRIURETIC PEPTIDE - Abnormal; Notable for the following components:   B Natriuretic  Peptide 1,133.8 (*)    All other components within normal limits  GLUCOSE, CAPILLARY - Abnormal; Notable for the following components:   Glucose-Capillary 288 (*)    All other components within normal limits  GLUCOSE, CAPILLARY - Abnormal; Notable for the following components:   Glucose-Capillary 212 (*)    All other components within normal limits  GLUCOSE, CAPILLARY - Abnormal; Notable for the following components:   Glucose-Capillary 104 (*)    All other components within normal limits  GLUCOSE, CAPILLARY - Abnormal; Notable for the following components:   Glucose-Capillary 193 (*)    All other components within normal limits  GLUCOSE, CAPILLARY - Abnormal; Notable for the  following components:   Glucose-Capillary 200 (*)    All other components within normal limits  GLUCOSE, CAPILLARY - Abnormal; Notable for the following components:   Glucose-Capillary 292 (*)    All other components within normal limits  I-STAT VENOUS BLOOD GAS, ED - Abnormal; Notable for the following components:   pH, Ven 7.441 (*)    pCO2, Ven 38.6 (*)    Calcium, Ion 1.09 (*)    All other components within normal limits  CBG MONITORING, ED - Abnormal; Notable for the following components:   Glucose-Capillary 190 (*)    All other components within normal limits  CBG MONITORING, ED - Abnormal; Notable for the following components:   Glucose-Capillary 169 (*)    All other components within normal limits  TROPONIN I (HIGH SENSITIVITY) - Abnormal; Notable for the following components:   Troponin I (High Sensitivity) 2,767 (*)    All other components within normal limits  TROPONIN I (HIGH SENSITIVITY) - Abnormal; Notable for the following components:   Troponin I (High Sensitivity) 3,942 (*)    All other components within normal limits  RESP PANEL BY RT-PCR (FLU A&B, COVID) ARPGX2  LIPID PANEL  MAGNESIUM  PHOSPHORUS  PROCALCITONIN  MAGNESIUM  PROCALCITONIN  POCT ACTIVATED CLOTTING TIME  POCT ACTIVATED CLOTTING TIME  POCT ACTIVATED CLOTTING TIME  POCT ACTIVATED CLOTTING TIME  POCT ACTIVATED CLOTTING TIME    EKG EKG Interpretation  Date/Time:  Tuesday June 04 2021 00:23:09 EDT Ventricular Rate:  89 PR Interval:  169 QRS Duration: 152 QT Interval:  396 QTC Calculation: 482 R Axis:   262 Text Interpretation: Sinus rhythm IVCD, consider atypical LBBB Non-specific ST-t changes Confirmed by Lajean Saver [phone]) on 06/05/2021 6:09:59 PM   Radiology No results found.   Procedures .Critical Care  Date/Time: 07/08/2021 12:15 AM Performed by: Luna Fuse, MD Authorized by: Luna Fuse, MD   Critical care provider statement:    Critical care time (minutes):  30    Critical care time was exclusive of:  Separately billable procedures and treating other patients and teaching time   Critical care was necessary to treat or prevent imminent or life-threatening deterioration of the following conditions:  Cardiac failure   Medications Ordered in ED Medications  lactated ringers infusion ( Intravenous Stopped 06/04/21 0715)  labetalol (NORMODYNE) injection 10 mg (has no administration in time range)  hydrALAZINE (APRESOLINE) injection 10 mg (has no administration in time range)  0.9 %  sodium chloride infusion (0 mLs Intravenous Stopped 06/04/21 1816)  aspirin chewable tablet 324 mg (324 mg Oral Given 06/03/21 1654)  cefTRIAXone (ROCEPHIN) 2 g in sodium chloride 0.9 % 100 mL IVPB (0 g Intravenous Stopped 06/03/21 2005)  azithromycin (ZITHROMAX) 500 mg in sodium chloride 0.9 % 250 mL IVPB (0 mg Intravenous Stopped 06/03/21 2112)  lactated ringers bolus 500 mL (0 mLs Intravenous Stopped 06/03/21 2007)  predniSONE (DELTASONE) tablet 60 mg (60 mg Oral Given 06/03/21 1931)  heparin bolus via infusion 3,500 Units (3,500 Units Intravenous Bolus from Bag 06/03/21 2009)  aspirin chewable tablet 81 mg (81 mg Oral Given 06/04/21 0714)  heparin bolus via infusion 2,000 Units (2,000 Units Intravenous Bolus from Bag 06/04/21 0623)  furosemide (LASIX) injection 20 mg (20 mg Intravenous Given 06/05/21 0516)  clopidogrel (PLAVIX) tablet 600 mg (600 mg Oral Given 06/05/21 0837)  AeroChamber Plus Flo-Vu Medium MISC 1 each (1 each Other Given 06/05/21 1530)  ALPRAZolam (XANAX) tablet 1 mg (0 mg Oral Duplicate 05/01/64 6812)  ALPRAZolam (XANAX) tablet 0.5 mg (0.5 mg Oral Given 06/06/21 0759)    ED Course  I have reviewed the triage vital signs and the nursing notes.  Pertinent labs & imaging results that were available during my care of the patient were reviewed by me and considered in my medical decision making (see chart for details).  Clinical Course as of 07/08/21 0015  Mon Jun 03, 2021  1751  Evidence of PNA on CXR along with history of cough, worsening SHOB and leukocytosis. Ordered abx to cover for CAP. Not septic. [ZB]  7517 Now having more wheezing on exam. Ordered for prednisone and albuterol [ZB]  1914 Troponins resulted at the same time.  Unfortunately, there is a lab delay today.  Initial troponin 2700 and repeat drawn 2 hours later is 4000. I ordered for repeat EKG.  He is still not having chest pain.  Shortness of breath has been improving. I ordered for heparin infusion.  Consulting cardiology. [ZB]  2141 Spoke with hospitalist for admission [ZB]    Clinical Course User Index [ZB] Pearson Grippe, DO   MDM Rules/Calculators/A&P                          This is a 70 year old male with a history of severe COPD (not on oxygen at home), CAD status post CABG 20+ years ago, CHF, previous CVA and hypertension who presented to the emergency department with shortness of breath worsening over the last 24 hours particularly this morning with associated cough and intermittent chest pain. Code STEMI was initially called from triage after EKG showed lateral ST depressions however similar to prior. At the time of my exam he had no chest pain and shortness of breath has been improving.  Differentials include but not limited to ACS, COPD exacerbation, CHF exacerbation, pneumonia.  No previous or recent history of trauma to suggest pneumothorax.  See clinical course above for further MDM.  Patient will undergo LHC tomorrow per cardiology. Admitted to medicine.  Final Clinical Impression(s) / ED Diagnoses Final diagnoses:  NSTEMI (non-ST elevated myocardial infarction) ([place])  COPD exacerbation ([place])  Community acquired pneumonia of right lung, unspecified part of lung    Rx / DC Orders ED Discharge Orders          Ordered    bisoprolol (ZEBETA) 5 MG tablet  Daily        06/06/21 1711    rosuvastatin (CRESTOR) 20 MG tablet  Daily        06/06/21 1711     mometasone-formoterol (DULERA) 200-5 MCG/ACT AERO  2 times daily,   Status:  Discontinued        06/06/21 1711    guaiFENesin (MUCINEX) 600 MG 12 hr tablet  2 times daily  06/06/21 1711    amoxicillin-clavulanate (AUGMENTIN) 875-125 MG tablet  Every 12 hours,   Status:  Discontinued        06/06/21 1711    varenicline (CHANTIX STARTING MONTH PAK) 0.5 MG X 11 & 1 MG X 42 tablet        06/06/21 1711    Increase activity slowly        06/06/21 1711    Diet - low sodium heart healthy        06/06/21 1711    AMB Referral to Phase II Cardiac Rehab        06/06/21 1711    Ambulatory referral to Physical Therapy       Comments: Outpatient PT Evaluation and treatment. Please call daughter [name] with visit times @ [phone]. Thanks   06/06/21 1736    AMB Referral to Cardiac Rehabilitation - Phase II        06/04/21 [place], [place], DO 06/03/21 2235    Luna Fuse, MD 06/03/21 2249    Luna Fuse, MD 07/08/21 [phone]

## 2021-06-03 NOTE — ED Provider Notes (Signed)
Emergency Medicine Provider Triage Evaluation Note  JERRAD MENDIBLES , a 70 y.o. male  was evaluated in triage.  Pt complains of COPD exacerbation. Onset 4:00 am. Home meds w/o relief.  Review of Systems  Positive: Wheezing, sob Negative:  fever  Physical Exam  SpO2 95%  Gen:   Awake, no distress   Resp:  Normal effort  MSK:   Moves extremities without difficulty  Other:   prolonged exp phase, poor air mvmt  Medical Decision Making  Medically screening exam initiated at 4:11 PM.  Appropriate orders placed.  JARYD DREW was informed that the remainder of the evaluation will be completed by another provider, this initial triage assessment does not replace that evaluation, and the importance of remaining in the ED until their evaluation is complete.  COPD - labs/ img started   Margarita Mail, PA-C 06/03/21 1615    Charlesetta Shanks, MD 06/11/21 2100

## 2021-06-03 NOTE — Progress Notes (Signed)
ANTICOAGULATION CONSULT NOTE - Initial Consult  Pharmacy Consult for heparin Indication: chest pain/ACS  No Known Allergies  Patient Measurements: Weight: 58.1 kg (128 lb) Heparin Dosing Weight: 58 kg   Vital Signs: BP: 130/71 (06/06 1915) Pulse Rate: 112 (06/06 1915)  Labs: Recent Labs    06/03/21 1613 06/03/21 1634 06/03/21 1639 06/03/21 1820  HGB 13.7 14.3  --   --   HCT 42.1 42.0  --   --   PLT 266  --   --   --   CREATININE 1.37*  --   --   --   TROPONINIHS  --   --  2,767* 3,942*    Estimated Creatinine Clearance: 40.1 mL/min (A) (by C-G formula based on SCr of 1.37 mg/dL (H)).   Medical History: Past Medical History:  Diagnosis Date  . Anxiety   . CAD (coronary artery disease)    s/p cabg x 4  . Cerebrovascular accident ([place])    hx of 06 20 09  . Depression    saw Dr. Casimiro Needle in the past  . Diabetes mellitus   . ED (erectile dysfunction)   . GERD (gastroesophageal reflux disease)   . Hypertension   . Insomnia     Medications:  (Not in a hospital admission)   Assessment: 2 YOM with chest pain concerning for ACS. Troponins elevated. H/H and Plt wnl. SCr 1.37  Goal of Therapy:  Heparin level 0.3-0.7 units/ml Monitor platelets by anticoagulation protocol: Yes   Plan:  -Heparin 3500 units IV bolus followed by heparin infusion at 750 units/hr -F/u 8 hr HL -Monitor daily HL, CBC and s/s of bleeding   Albertina Parr, PharmD., BCPS, BCCCP Clinical Pharmacist Please refer to [facility] for unit-specific pharmacist

## 2021-06-03 NOTE — Consult Note (Addendum)
Cardiology Consultation:   Patient ID: Wayne Williams MRN: [id number]; DOB: Nov 16, 1951  Admit date: 06/03/2021 Date of Consult: 06/03/2021  PCP:  Laurey Morale, MD   [facility] Cardiologist:  Dr. Percival Spanish in 2016  Patient Profile:   Wayne Williams is a 70 y.o. male with a hx of CAD s/p CABG 5V in 2001, heart failure reduced EF, none oxygen dependent COPD, active tobacco use, anxiety, CVA, type 2 diabetes, hypertension, BPH, who is being seen 06/03/2021 for the evaluation of elevated troponin and abnormal EKG at the request of Dr. Almyra Free.   History of Present Illness:   Wayne Williams was seen by Dr. Percival Spanish in 2015 and  2016 in the office for CAD.  Due to chest pain, he underwent left heart catheterization on 01/24/2015, noted to have triple-vessel CAD s/p 5 vessel CABG with 1/5 patent bypass grafts (SVG to OM occluded, SVG to diagonal occluded, SVG sequential to distal RCA and PDA occluded, LIMA to mid LAD patent), moderate nonobstructive disease in mid RCA and mid circumflex (not flow-limiting), mild LV systolic dysfunction with EF 40%.  He was recommended medical management for CAD at the time.  He was last seen on 02/14/2015 in the office, complained of some exertional chest discomfort without resting angina, revascularization was felt not an option, his Imdur was increased 220 mg daily. He has not followed up with cardiology since.   Patient presented to the ER today complaining of shortness of breath.  His daughter is at bedside assisting history.  Daughter states patient has COPD, over the past several days, he has been having worsening coughing, shortness of breath, felt his COPD is in exacerbation.  Patient states he continues to smoke occasionally. He is not on oxygen at baseline and is placed on nasal cannula at ED. he noticed himself wheezing and more short of breath at rest, symptom not improving with home bronchodilator.  He reports a few minutes onset of right lower rib pain,  that resolved spontaneously.  He denied ever having midsternal or left-sided chest pain/tightness/pressure.  He denied any dizziness, nausea, vomiting, paresthesia, syncope, arm pain.  Daughter states his blood pressure is usually low normal.  Patient states his heart rate is typically fast > 100s but not 120-130s.   STEMI code was called by ER, upon evaluation patient clinically and previous EKG comparison, STEMI code was canceled.   Diagnostic work-up at admission revealed VBG pH 7.44.  BMP with creatinine 1.37, GFR 55, otherwise unremarkable.  Ionized calcium 1.09.  CBC revealed leukocytosis with WBC 17 400.  Flu and COVID-negative.Chest x-ray revealed minimal ill-defined opacity of right lung base concerning for atelectasis versus pneumonia, chronic hyperinflation and bronchial thickening consistent with COPD, blunting of costophrenic angles.  EKG revealed sinus tachycardia with ventricular rate of 131, LAD, LVH with repolarization abnormality, IVCD, similar to EKG from 03/05/2021.  BNP 296. HS troponin 2767 >3942.  He was given 324mg  ASA and started on heparin gtt at ED for NSTEMI. He was also started on antibiotic and steroids for pneumonia and COPD exacerbation.  Cardiology is consulted for further input.    Past Medical History:  Diagnosis Date  . Anxiety   . CAD (coronary artery disease)    s/p cabg x 4  . Cerebrovascular accident ([place])    hx of 06 20 09  . Depression    saw Dr. Casimiro Needle in the past  . Diabetes mellitus   . ED (erectile dysfunction)   . GERD (gastroesophageal reflux  disease)   . Hypertension   . Insomnia     Past Surgical History:  Procedure Laterality Date  . CERVICAL LAMINECTOMY  1996   per Dr. Hal Neer  . Heart bypass  2001  . LEFT HEART CATHETERIZATION WITH CORONARY/GRAFT ANGIOGRAM N/A 01/24/2015   Procedure: LEFT HEART CATHETERIZATION WITH Beatrix Fetters;  Surgeon: Burnell Blanks, MD;  Location: [facility] CATH LAB;  Service: Cardiovascular;   Laterality: N/A;  . [place]   per Dr. Gladstone Lighter     Home Medications:  Prior to Admission medications   Medication Sig Start Date End Date Taking? Authorizing Provider  albuterol (VENTOLIN HFA) 108 (90 Base) MCG/ACT inhaler INHALE 2 PUFFS INTO THE LUNGS EVERY 4 (FOUR) HOURS AS NEEDED FOR WHEEZING OR SHORTNESS OF BREATH. 04/30/21   Laurey Morale, MD  ALPRAZolam Duanne Moron) 0.5 MG tablet TAKE 1 TABLET BY MOUTH EVERY 6 (SIX) HOURS AS NEEDED FOR ANXIETY. Patient taking differently: Take 0.5 mg by mouth at bedtime. 01/04/21   Laurey Morale, MD  aspirin 81 MG tablet Take 81 mg by mouth daily.    [provider]  Adair Patter [phone] MCG/INH AEPB INHALE 1 PUFF INTO THE LUNGS DAILY IN THE AFTERNOON. 03/23/21   Laurey Morale, MD  clopidogrel (PLAVIX) 75 MG tablet TAKE 1 TABLET BY MOUTH EVERY DAY 05/17/21   Laurey Morale, MD  Colchicine (MITIGARE) 0.6 MG CAPS Take 0.6 mg by mouth every 6 (six) hours as needed. Patient taking differently: Take 0.6 mg by mouth every 6 (six) hours as needed (gout flares). 08/24/20   Laurey Morale, MD  metFORMIN (GLUCOPHAGE) 1000 MG tablet TAKE 1 TABLET BY MOUTH TWICE A DAY Patient taking differently: Take 1,000 mg by mouth 2 (two) times daily with a meal. 12/24/20   Laurey Morale, MD  metoprolol succinate (TOPROL-XL) 50 MG 24 hr tablet TAKE 1 TABLET BY MOUTH DAILY WITH OR IMMEDIATELY FOLLOWING A MEAL. Patient taking differently: Take 50 mg by mouth daily. 12/03/20   Laurey Morale, MD  nitroGLYCERIN (NITROSTAT) 0.4 MG SL tablet Place 1 tablet (0.4 mg total) under the tongue every 5 (five) minutes as needed for chest pain. 08/20/20   Laurey Morale, MD  predniSONE (DELTASONE) 10 MG tablet Take 1 tablet (10 mg total) by mouth daily with breakfast. 04/02/21   Laurey Morale, MD  simvastatin (ZOCOR) 40 MG tablet TAKE 1 TABLET BY MOUTH EVERYDAY AT BEDTIME 04/30/21   Laurey Morale, MD  tamsulosin (FLOMAX) 0.4 MG CAPS capsule TAKE 1 CAPSULE BY MOUTH EVERY  DAY Patient taking differently: Take 0.4 mg by mouth daily. 03/01/21   Laurey Morale, MD  triamcinolone cream (KENALOG) 0.1 % Apply 1 application topically 2 (two) times daily as needed. Patient taking differently: Apply 1 application topically 2 (two) times daily as needed (rash). 06/22/20   Laurey Morale, MD    Inpatient Medications: Scheduled Meds: . heparin  3,500 Units Intravenous Once   Continuous Infusions: . azithromycin (ZITHROMAX) 500 MG IVPB (Vial-Mate [employer])    . heparin    . lactated ringers     PRN Meds:   Allergies:   No Known Allergies  Social History:   Social History   Socioeconomic History  . Marital status: Divorced    Spouse name: Not on file  . Number of children: Not on file  . Years of education: Not on file  . Highest education level: Not on file  Occupational History  . Occupation:  Retired   Tobacco Use  . Smoking status: Current Every Day Smoker    Packs/day: 1.00    Years: 50.00    Pack years: 50.00    Types: Cigarettes  . Smokeless tobacco: Never Used  Substance and Sexual Activity  . Alcohol use: No    Alcohol/week: 0.0 standard drinks  . Drug use: No  . Sexual activity: Not on file  Other Topics Concern  . Not on file  Social History Narrative   Disabled   Divorced, lives alone         Social Determinants of Health   Financial Resource Strain: Low Risk   . Difficulty of Paying Living Expenses: Not hard at all  Food Insecurity: No Food Insecurity  . Worried About Charity fundraiser in the Last Year: Never true  . Ran Out of Food in the Last Year: Never true  Transportation Needs: No Transportation Needs  . Lack of Transportation (Medical): No  . Lack of Transportation (Non-Medical): No  Physical Activity: Inactive  . Days of Exercise per Week: 0 days  . Minutes of Exercise per Session: 0 min  Stress: No Stress Concern Present  . Feeling of Stress : Not at all  Social Connections: Socially Isolated  . Frequency of  Communication with Friends and Family: Once a week  . Frequency of Social Gatherings with Friends and Family: Once a week  . Attends Religious Services: Never  . Active Member of Clubs or Organizations: No  . Attends Archivist Meetings: Never  . Marital Status: Divorced  Human resources officer Violence: Not At Risk  . Fear of Current or Ex-Partner: No  . Emotionally Abused: No  . Physically Abused: No  . Sexually Abused: No    Family History:    Family History  Problem Relation Age of Onset  . Coronary artery disease Other   . Hypertension Other      ROS:  Constitutional: see HPI  Eyes: Denied vision change or loss Ears/Nose/Mouth/Throat: Denied ear ache, sore throat, sinus pain Cardiovascular: see HPI  Respiratory: see HPI  Gastrointestinal: Denied nausea, vomiting, abdominal pain, diarrhea Genital/Urinary: Denied dysuria, hematuria, urinary frequency/urgency Musculoskeletal: Denied muscle ache, joint pain, weakness Skin: Denied rash, wound Neuro: Denied headache, dizziness, syncope Psych: history of depression/anxiety  Endocrine: history of diabetes      Physical Exam/Data:   Vitals:   06/03/21 1845 06/03/21 1900 06/03/21 1915 06/03/21 1936  BP: 123/71 127/78 130/71 126/71  Pulse: (!) 110 (!) 110 (!) 112 (!) 105  Resp: 20 20 (!) 21 20  Temp:    98.6 F (37 C)  TempSrc:    Oral  SpO2: 97% 97% 95% 98%  Weight:       No intake or output data in the 24 hours ending 06/03/21 2006 Last 3 Weights 06/03/2021 05/20/2021 04/02/2021  Weight (lbs) 128 lb 129 lb 128 lb 6.4 oz  Weight (kg) 58.06 kg 58.514 kg 58.242 kg     Body mass index is 17.36 kg/m.   General: Cachectic HEENT: EOMI intact, head atraumatic Neck: no JVD Cardiac:  normal S1, S2; RRR; no murmur  Lungs:  clear to auscultation bilaterally, scattered wheezing, on 2 L nasal cannula oxygen, speaks in full sentences Abd: soft, nontender, no hepatomegaly  Ext: no BLE edema Musculoskeletal:  No deformities,  BUE and BLE strength normal and equal Skin: warm and dry  Neuro: Alert and oriented x3, follow commands appropriately, carry conversation appropriately Psych:  Normal affect  EKG:  The EKG was personally reviewed and demonstrates: Sinus tachycardia with ventricular rate of 131 bpm, LAD, IVCD, LVH with associated repolarization abnormality  Telemetry:  Telemetry was personally reviewed and demonstrates: Sinus tachycardia  Relevant CV Studies:  Echocardiogram from 01/16/2015:  Left ventricle: Systolic function was moderately reduced. The  estimated ejection fraction was in the range of 35% to 40%. There  is hypokinesis of the anteroseptal myocardium. Doppler parameters  are consistent with abnormal left ventricular relaxation (grade 1  diastolic dysfunction).  - Aortic valve: There was mild regurgitation.  - Mitral valve: There was mild regurgitation.   Impressions:   - When compared to prior ECHO, EF has reduced and there appears to  be new anteroseptal infarct.    Left heart catheterization from 01/24/2015:   Left main: 30% ostial stenosis.   Left Anterior Descending Artery: Large caliber vessel that courses to the apex. 60% proximal stenosis leading into a moderate caliber septal perforating branch. 100% mid occlusion. The mid and distal vessel fills from the patent IMA graft.   Circumflex Artery: Large caliber vessel with one large obtuse marginal branch. The obtuse marginal branch is occluded at the ostium and fills from right to left collaterals. The mid Circumflex beyond the takeoff of the OM branch has a smooth 50% stenosis which does not appear to be flow limiting.   Right Coronary Artery: Large dominant vessel with mid 50% stenosis. There is diffuse mild plaque in the mid and distal vessel. The PDA is a small caliber vessel with diffuse 90% stenosis. The Posterolateral branch is a moderate caliber vessel with mild plaque.   Graft Anatomy:  SVG to OM is  occluded SVG to Diagonal is occluded SVG sequential to distal RCA and PDA is occluded LIMA to mid LAD is patent  Left Ventricular Angiogram: LVEF=40% with global hypokinesis, apical akinesis.   Impression: 1. Triple vessel CAD s/p 5V CABG with 1/5 patent bypass grafts 2. Moderate non-obstructive disease in the mid RCA and mid Circumflex, neither of which appears to be flow limiting 3. Mild LV systolic dysfunction    Laboratory Data:  High Sensitivity Troponin:   Recent Labs  Lab 06/03/21 1639 06/03/21 1820  TROPONINIHS 2,767* 3,942*     Chemistry Recent Labs  Lab 06/03/21 1613 06/03/21 1634  NA 138 139  K 4.3 4.2  CL 102  --   CO2 26  --   GLUCOSE 144*  --   BUN 20  --   CREATININE 1.37*  --   CALCIUM 8.9  --   GFRNONAA 55*  --   ANIONGAP 10  --     No results for input(s): PROT, ALBUMIN, AST, ALT, ALKPHOS, BILITOT in the last 168 hours. Hematology Recent Labs  Lab 06/03/21 1613 06/03/21 1634  WBC 17.4*  --   RBC 4.35  --   HGB 13.7 14.3  HCT 42.1 42.0  MCV 96.8  --   MCH 31.5  --   MCHC 32.5  --   RDW 14.7  --   PLT 266  --    BNP Recent Labs  Lab 06/03/21 1639  BNP 296.3*    DDimer No results for input(s): DDIMER in the last 168 hours.   Radiology/Studies:  DG Chest Portable 1 View  Result Date: 06/03/2021 CLINICAL DATA:  Chest pain.  COPD exacerbation. EXAM: PORTABLE CHEST 1 VIEW COMPARISON:  Most recent radiograph 03/02/2021. FINDINGS: Post median sternotomy and CABG. Normal heart size. Aortic atherosclerosis. Chronic hyperinflation and bronchial thickening.  Blunting of the left greater than right costophrenic angle, similar to prior exam. Minimal ill-defined opacity at the right lung base. No pneumothorax. No pulmonary edema. No acute osseous abnormalities are seen. IMPRESSION: 1. Minimal ill-defined opacity at the right lung base, atelectasis versus pneumonia. 2. Chronic hyperinflation and bronchial thickening, imaging findings consistent with  COPD. 3. Blunting of the costophrenic angles appear similar, may be due to hyperinflation versus small effusions. Electronically Signed   By: Keith Rake M.D.   On: 06/03/2021 17:23     Assessment and Plan:   NSTEMI CAD -Patient presented with shortness of breath, coughing, wheezing, right lower rib pain lasted few minutes -EKG without acute ischemic change -High sensitive troponin elevated 2767 >3942 -He has known severe triple-vessel CAD with 1/5 patent graft LIMA to LAD from cardiac cath 2016, was recommended medical management at the time -Started on a heparin infusion at ED,  OK to continue -will resume aspirin 81 daily and simvastatin 40 mg daily -Okay to hold metoprolol in the setting of COPD exacerbation -Plan for cardiac cath tomorrow, n.p.o. after midnight  Acute hypoxic respiratory failure Community-acquired pneumonia COPD exacerbation -Treated per primary team  AKI -Creatinine baseline appears 1-1.19, 1.37 POA - will initiate gentle hydration before cath with LR at 1110ml.hr for 12 hours  - repeat BMP in AM -Avoid nephrotoxin, hold metformin  Tobacco abuse -Discussed the need of absolute cessation  Type 2 diabetes -We will check hemoglobin A1c -Management per primary team  Hyperlipidemia -will check lipid panel    Risk Assessment/Risk Scores:   TIMI Risk Score for Unstable Angina or Non-ST Elevation MI:   The patient's TIMI risk score is 5, which indicates a 26% risk of all cause mortality, new or recurrent myocardial infarction or need for urgent revascularization in the next 14 days  New York Heart Association (NYHA) Functional Class NYHA Class II        For questions or updates, please contact [facility] Please consult [email or web address] for contact info under    Signed, Margie Billet, NP  06/03/2021 8:06 PM   Patient seen and examined and agree with Lance Sell, NP as detailed above.  In brief, the patient is a 70 y.o. male with a hx of CAD s/p  CABG 5V in 2001, HFrEF(35-40%), COPD, active tobacco use, anxiety, CVA, type 2 diabetes, hypertension, and BPH who presented with SOB found to have ECG changes with initial concern for STEMI which was cancelled earlier in the day as tracings similar to prior now found to have elevated troponin for which cardiology has been consulted.  Patient has history of known severe multivessel CAD. Last cath on 01/24/2015 showed severe native triple-vessel CAD with only patent LIMA-to LAD (SVG to OM occluded, SVG to diagonal occluded, SVG sequential to distal RCA and PDA occluded. There was moderate nonobstructive disease in mid RCA and mid circumflex (not flow-limiting), mild LV systolic dysfunction with EF 40%.  He was recommended medical management for CAD at the time.  He was last seen on 02/14/2015 in the office, where he complained of some exertional chest discomfort without resting angina, revascularization was felt not an option, and his Imdur was increased 220 mg daily. He has not followed up with cardiology since.   Patient presented to ER today with SOB. ECG on arrival showed sinus tachycardia with ventricular rate of 131 bpm, LAD, IVCD, LVH with strain. Was initially paged out as a STEMI which was cancelled as tracing similar to prior. ER work-up notable for  CXR concerning for possibly PNA for which he was started on ABX as well as steroids for COPD exacerbation. Trop returned 2767-->3942 which prompted Cardiology consult.  Currently, the patient states his breathing is improved. No chest pain. Appears dry on examination.   GEN: Thin, elderly male who appears older than stated age Neck: No JVD Cardiac: Tachycardic, regular, no murmurs Respiratory: Diminished but no wheezes GI: Soft, nontender, non-distended  MS: Thin, no edema Neuro:  Nonfocal  Psych: Normal affect    Possibly demand ischemia triggered by COPD/pneumonia in the setting of known severe CAD and underlying HFrEF vs ACS. Currently patient  is chest pain free with improving dyspnea. Clinically dry on exam. TTE pending. Given rising troponin and known disease, will keep NPO for possible cath tomorrow.  Plan: -NPO at MN for possible cath in the AM  -Continue heparin gtt -Repeat TTE to assess LVEF; appears dry with no evidence of acute volume overload -Continue ASA 81mg  daily -Change simvastatin to crestor 20mg  daily -Holding BB in the setting of acute COPD exacerbation -Will add GDMT as tolerated for HFrEF (entresto, spiro, farxiga etc) -Management of PNA per primary team  Plan discussed with the patient and his daughter at length at bedside.  INFORMED CONSENT: I have reviewed the risks, indications, and alternatives to cardiac catheterization, possible angioplasty, and stenting with the patient. Risks include but are not limited to bleeding, infection, vascular injury, stroke, myocardial infection, arrhythmia, kidney injury, radiation-related injury in the case of prolonged fluoroscopy use, emergency cardiac surgery, and death. The patient understands the risks of serious complication is 1-2 in 5436 with diagnostic cardiac cath and 1-2% or less with angioplasty/stenting.   Gwyndolyn Kaufman, MD

## 2021-06-04 ENCOUNTER — Encounter (HOSPITAL_COMMUNITY)
Admission: EM | Disposition: A | Discharge status: Home / Self Care | Payer: Self-pay | Source: Home / Self Care | Attending: Family Medicine

## 2021-06-04 ENCOUNTER — Inpatient Hospital Stay (HOSPITAL_COMMUNITY): Payer: Medicare PPO

## 2021-06-04 ENCOUNTER — Encounter (HOSPITAL_COMMUNITY): Payer: Self-pay | Admitting: Interventional Cardiology

## 2021-06-04 DIAGNOSIS — J189 Pneumonia, unspecified organism: Secondary | ICD-10-CM

## 2021-06-04 DIAGNOSIS — I214 Non-ST elevation (NSTEMI) myocardial infarction: Secondary | ICD-10-CM

## 2021-06-04 DIAGNOSIS — I248 Other forms of acute ischemic heart disease: Secondary | ICD-10-CM

## 2021-06-04 DIAGNOSIS — J9621 Acute and chronic respiratory failure with hypoxia: Secondary | ICD-10-CM

## 2021-06-04 DIAGNOSIS — I2510 Atherosclerotic heart disease of native coronary artery without angina pectoris: Secondary | ICD-10-CM

## 2021-06-04 HISTORY — PX: LEFT HEART CATH AND CORS/GRAFTS ANGIOGRAPHY: CATH118250

## 2021-06-04 HISTORY — PX: CORONARY STENT INTERVENTION: CATH118234

## 2021-06-04 HISTORY — PX: INTRAVASCULAR ULTRASOUND/IVUS: CATH118244

## 2021-06-04 LAB — HEPARIN LEVEL (UNFRACTIONATED): Heparin Unfractionated: <0.1 IU/mL — ABNORMAL LOW (ref 0.30–0.70)

## 2021-06-04 LAB — LIPID PANEL
Cholesterol: 127 mg/dL (ref 0–200)
HDL: 70 mg/dL (ref >40)
LDL Cholesterol: 49 mg/dL (ref 0–99)
Total CHOL/HDL Ratio: 1.8 RATIO
Triglycerides: 39 mg/dL (ref <150)
VLDL: 8 mg/dL (ref 0–40)

## 2021-06-04 LAB — BASIC METABOLIC PANEL
Anion gap: 10 (ref 5–15)
BUN: 32 mg/dL — ABNORMAL HIGH (ref 8–23)
CO2: 23 mmol/L (ref 22–32)
Calcium: 8.5 mg/dL — ABNORMAL LOW (ref 8.9–10.3)
Chloride: 104 mmol/L (ref 98–111)
Creatinine, Ser: 1.45 mg/dL — ABNORMAL HIGH (ref 0.61–1.24)
GFR, Estimated: 51 mL/min — ABNORMAL LOW (ref >60)
Glucose, Bld: 235 mg/dL — ABNORMAL HIGH (ref 70–99)
Potassium: 4.4 mmol/L (ref 3.5–5.1)
Sodium: 137 mmol/L (ref 135–145)

## 2021-06-04 LAB — CBC
HCT: 36.4 % — ABNORMAL LOW (ref 39.0–52.0)
Hemoglobin: 12 g/dL — ABNORMAL LOW (ref 13.0–17.0)
MCH: 32 pg (ref 26.0–34.0)
MCHC: 33 g/dL (ref 30.0–36.0)
MCV: 97.1 fL (ref 80.0–100.0)
Platelets: 195 10*3/uL (ref 150–400)
RBC: 3.75 MIL/uL — ABNORMAL LOW (ref 4.22–5.81)
RDW: 14.8 % (ref 11.5–15.5)
WBC: 19.6 10*3/uL — ABNORMAL HIGH (ref 4.0–10.5)
nRBC: 0 % (ref 0.0–0.2)

## 2021-06-04 LAB — PROTIME-INR
INR: 1.3 — ABNORMAL HIGH (ref 0.8–1.2)
Prothrombin Time: 15.7 seconds — ABNORMAL HIGH (ref 11.4–15.2)

## 2021-06-04 LAB — POCT ACTIVATED CLOTTING TIME
Activated Clotting Time: 232 seconds
Activated Clotting Time: 184 seconds
Activated Clotting Time: 160 seconds

## 2021-06-04 LAB — ECHOCARDIOGRAM COMPLETE
Area-P 1/2: 5.66 cm2
P 1/2 time: 489 msec
S' Lateral: 4 cm
Weight: 2048 oz

## 2021-06-04 LAB — PHOSPHORUS: Phosphorus: 2.7 mg/dL (ref 2.5–4.6)

## 2021-06-04 LAB — MAGNESIUM: Magnesium: 1.8 mg/dL (ref 1.7–2.4)

## 2021-06-04 LAB — GLUCOSE, CAPILLARY: Glucose-Capillary: 168 mg/dL — ABNORMAL HIGH (ref 70–99)

## 2021-06-04 LAB — CBG MONITORING, ED
Glucose-Capillary: 190 mg/dL — ABNORMAL HIGH (ref 70–99)
Glucose-Capillary: 169 mg/dL — ABNORMAL HIGH (ref 70–99)

## 2021-06-04 SURGERY — LEFT HEART CATH AND CORS/GRAFTS ANGIOGRAPHY
Anesthesia: LOCAL

## 2021-06-04 MED ORDER — METHYLPREDNISOLONE SODIUM SUCC 40 MG IJ SOLR
40.0000 mg | Freq: Three times a day (TID) | INTRAMUSCULAR | Status: DC
  Administered 2021-06-04 – 2021-06-05 (×5): 40 mg via INTRAVENOUS
  Filled 2021-06-04 (×5): qty 1

## 2021-06-04 MED ORDER — LABETALOL HCL 5 MG/ML IV SOLN: 10.0000 mg | INTRAVENOUS | Status: AC | PRN

## 2021-06-04 MED ORDER — HEPARIN SODIUM (PORCINE) 1000 UNIT/ML IJ SOLN
INTRAMUSCULAR | Status: AC
  Filled 2021-06-04: qty 1

## 2021-06-04 MED ORDER — TICAGRELOR 90 MG PO TABS
ORAL_TABLET | ORAL | Status: DC | PRN
  Administered 2021-06-04: 180 mg via ORAL

## 2021-06-04 MED ORDER — SODIUM CHLORIDE 0.9 % IV SOLN
100.0000 mg | Freq: Two times a day (BID) | INTRAVENOUS | Status: DC
  Administered 2021-06-04 – 2021-06-05 (×2): 100 mg via INTRAVENOUS
  Filled 2021-06-04 (×3): qty 100

## 2021-06-04 MED ORDER — HEPARIN (PORCINE) IN NACL 1000-0.9 UT/500ML-% IV SOLN
INTRAVENOUS | Status: DC | PRN
  Administered 2021-06-04 (×2): 500 mL

## 2021-06-04 MED ORDER — ASPIRIN 81 MG PO CHEW
81.0000 mg | CHEWABLE_TABLET | Freq: Every day | ORAL | Status: DC
  Administered 2021-06-05 – 2021-06-06 (×2): 81 mg via ORAL
  Filled 2021-06-04 (×3): qty 1

## 2021-06-04 MED ORDER — IOHEXOL 350 MG/ML SOLN
INTRAVENOUS | Status: DC | PRN
  Administered 2021-06-04: 95 mL

## 2021-06-04 MED ORDER — HEPARIN SODIUM (PORCINE) 1000 UNIT/ML IJ SOLN
INTRAMUSCULAR | Status: DC | PRN
  Administered 2021-06-04: 7000 [IU] via INTRAVENOUS
  Administered 2021-06-04: 2000 [IU] via INTRAVENOUS

## 2021-06-04 MED ORDER — NITROGLYCERIN 1 MG/10 ML FOR IR/CATH LAB
INTRA_ARTERIAL | Status: AC
  Filled 2021-06-04: qty 10

## 2021-06-04 MED ORDER — SODIUM CHLORIDE 0.9 % IV SOLN: INTRAVENOUS | Status: AC

## 2021-06-04 MED ORDER — LIDOCAINE HCL (PF) 1 % IJ SOLN
INTRAMUSCULAR | Status: DC | PRN
  Administered 2021-06-04: 15 mL

## 2021-06-04 MED ORDER — MIDAZOLAM HCL 2 MG/2ML IJ SOLN
INTRAMUSCULAR | Status: DC | PRN
  Administered 2021-06-04: 1 mg via INTRAVENOUS

## 2021-06-04 MED ORDER — ALPRAZOLAM 0.25 MG PO TABS
0.2500 mg | ORAL_TABLET | Freq: Every evening | ORAL | Status: DC | PRN
  Administered 2021-06-04 (×2): 0.25 mg via ORAL
  Filled 2021-06-04 (×2): qty 1

## 2021-06-04 MED ORDER — FENTANYL CITRATE (PF) 100 MCG/2ML IJ SOLN
INTRAMUSCULAR | Status: AC
  Filled 2021-06-04: qty 2

## 2021-06-04 MED ORDER — SODIUM CHLORIDE 0.9 % IV SOLN
2.0000 g | INTRAVENOUS | Status: DC
  Administered 2021-06-04 – 2021-06-05 (×2): 2 g via INTRAVENOUS
  Filled 2021-06-04 (×3): qty 20

## 2021-06-04 MED ORDER — IPRATROPIUM-ALBUTEROL 0.5-2.5 (3) MG/3ML IN SOLN
3.0000 mL | Freq: Four times a day (QID) | RESPIRATORY_TRACT | Status: DC
  Administered 2021-06-04: 3 mL via RESPIRATORY_TRACT
  Filled 2021-06-04: qty 3

## 2021-06-04 MED ORDER — FLUTICASONE FUROATE-VILANTEROL 200-25 MCG/INH IN AEPB
1.0000 | INHALATION_SPRAY | Freq: Every day | RESPIRATORY_TRACT | Status: DC
  Administered 2021-06-05 – 2021-06-06 (×2): 1 via RESPIRATORY_TRACT
  Filled 2021-06-04: qty 28

## 2021-06-04 MED ORDER — ACETAMINOPHEN 325 MG PO TABS: 650.0000 mg | ORAL_TABLET | ORAL | Status: DC | PRN

## 2021-06-04 MED ORDER — TICAGRELOR 90 MG PO TABS
ORAL_TABLET | ORAL | Status: AC
  Filled 2021-06-04: qty 2

## 2021-06-04 MED ORDER — SODIUM CHLORIDE 0.9 % IV SOLN: 250.0000 mL | INTRAVENOUS | Status: DC | PRN

## 2021-06-04 MED ORDER — ENOXAPARIN SODIUM 40 MG/0.4ML IJ SOSY
40.0000 mg | PREFILLED_SYRINGE | Freq: Every day | INTRAMUSCULAR | Status: DC
  Administered 2021-06-06: 40 mg via SUBCUTANEOUS
  Filled 2021-06-04: qty 0.4

## 2021-06-04 MED ORDER — MIDAZOLAM HCL 2 MG/2ML IJ SOLN
INTRAMUSCULAR | Status: AC
  Filled 2021-06-04: qty 2

## 2021-06-04 MED ORDER — TAMSULOSIN HCL 0.4 MG PO CAPS
0.4000 mg | ORAL_CAPSULE | Freq: Every day | ORAL | Status: DC
  Administered 2021-06-04 – 2021-06-05 (×2): 0.4 mg via ORAL
  Filled 2021-06-04 (×3): qty 1

## 2021-06-04 MED ORDER — LIDOCAINE HCL (PF) 1 % IJ SOLN
INTRAMUSCULAR | Status: AC
  Filled 2021-06-04: qty 30

## 2021-06-04 MED ORDER — HEPARIN (PORCINE) IN NACL 1000-0.9 UT/500ML-% IV SOLN
INTRAVENOUS | Status: AC
  Filled 2021-06-04: qty 1000

## 2021-06-04 MED ORDER — INSULIN ASPART 100 UNIT/ML IJ SOLN
0.0000 [IU] | INTRAMUSCULAR | Status: DC
  Administered 2021-06-04 – 2021-06-05 (×5): 2 [IU] via SUBCUTANEOUS
  Administered 2021-06-05: 5 [IU] via SUBCUTANEOUS
  Administered 2021-06-05: 2 [IU] via SUBCUTANEOUS
  Administered 2021-06-06: 5 [IU] via SUBCUTANEOUS
  Administered 2021-06-06: 3 [IU] via SUBCUTANEOUS
  Administered 2021-06-06 (×2): 2 [IU] via SUBCUTANEOUS

## 2021-06-04 MED ORDER — HYDRALAZINE HCL 20 MG/ML IJ SOLN: 10.0000 mg | INTRAMUSCULAR | Status: AC | PRN

## 2021-06-04 MED ORDER — HEPARIN BOLUS VIA INFUSION
2000.0000 [IU] | Freq: Once | INTRAVENOUS | Status: AC
  Administered 2021-06-04: 2000 [IU] via INTRAVENOUS
  Filled 2021-06-04: qty 2000

## 2021-06-04 MED ORDER — FENTANYL CITRATE (PF) 100 MCG/2ML IJ SOLN
INTRAMUSCULAR | Status: DC | PRN
  Administered 2021-06-04 (×2): 25 ug via INTRAVENOUS

## 2021-06-04 MED ORDER — GUAIFENESIN ER 600 MG PO TB12
1200.0000 mg | ORAL_TABLET | Freq: Two times a day (BID) | ORAL | Status: DC
  Administered 2021-06-04 – 2021-06-06 (×4): 1200 mg via ORAL
  Filled 2021-06-04 (×4): qty 2

## 2021-06-04 MED ORDER — ASPIRIN 81 MG PO CHEW
81.0000 mg | CHEWABLE_TABLET | ORAL | Status: AC
  Administered 2021-06-04: 81 mg via ORAL
  Filled 2021-06-04: qty 1

## 2021-06-04 MED ORDER — PANTOPRAZOLE SODIUM 40 MG PO TBEC
40.0000 mg | DELAYED_RELEASE_TABLET | Freq: Every day | ORAL | Status: DC
  Administered 2021-06-05 – 2021-06-06 (×2): 40 mg via ORAL
  Filled 2021-06-04 (×2): qty 1

## 2021-06-04 MED ORDER — SODIUM CHLORIDE 0.9% FLUSH: 3.0000 mL | INTRAVENOUS | Status: DC | PRN

## 2021-06-04 MED ORDER — SODIUM CHLORIDE 0.9 % IV SOLN: INTRAVENOUS | Status: DC

## 2021-06-04 MED ORDER — ONDANSETRON HCL 4 MG/2ML IJ SOLN: 4.0000 mg | Freq: Four times a day (QID) | INTRAMUSCULAR | Status: DC | PRN

## 2021-06-04 MED ORDER — IPRATROPIUM-ALBUTEROL 0.5-2.5 (3) MG/3ML IN SOLN
3.0000 mL | Freq: Two times a day (BID) | RESPIRATORY_TRACT | Status: DC
  Administered 2021-06-04 – 2021-06-05 (×2): 3 mL via RESPIRATORY_TRACT
  Filled 2021-06-04 (×2): qty 3

## 2021-06-04 MED ORDER — SODIUM CHLORIDE 0.9% FLUSH
3.0000 mL | Freq: Two times a day (BID) | INTRAVENOUS | Status: DC
  Administered 2021-06-04 – 2021-06-06 (×4): 3 mL via INTRAVENOUS

## 2021-06-04 MED ORDER — NITROGLYCERIN 1 MG/10 ML FOR IR/CATH LAB
INTRA_ARTERIAL | Status: DC | PRN
  Administered 2021-06-04: 200 ug via INTRACORONARY

## 2021-06-04 MED ORDER — ASPIRIN 81 MG PO CHEW: 81.0000 mg | CHEWABLE_TABLET | Freq: Every day | ORAL | Status: DC

## 2021-06-04 MED ORDER — TICAGRELOR 90 MG PO TABS
90.0000 mg | ORAL_TABLET | Freq: Two times a day (BID) | ORAL | Status: DC
  Administered 2021-06-04: 90 mg via ORAL
  Filled 2021-06-04 (×2): qty 1

## 2021-06-04 SURGICAL SUPPLY — 23 items
BALL SAPPHIRE NC24 3.75X22 (BALLOONS) ×2
BALLN SAPPHIRE 2.75X20 (BALLOONS) ×2
BALLOON SAPPHIRE 2.75X20 (BALLOONS) IMPLANT
BALLOON SAPPHIRE NC24 3.75X22 (BALLOONS) IMPLANT
CATH INFINITI 5FR MULTPACK ANG (CATHETERS) ×1 IMPLANT
CATH LAUNCHER 6FR EBU 3.75 (CATHETERS) ×1 IMPLANT
CATH OPTICROSS HD (CATHETERS) ×1 IMPLANT
KIT ENCORE 26 ADVANTAGE (KITS) ×1 IMPLANT
KIT HEART LEFT (KITS) ×2 IMPLANT
PACK CARDIAC CATHETERIZATION (CUSTOM PROCEDURE TRAY) ×2 IMPLANT
SHEATH PINNACLE 5F 10CM (SHEATH) ×1 IMPLANT
SHEATH PINNACLE 6F 10CM (SHEATH) ×1 IMPLANT
SHEATH PROBE COVER 6X72 (BAG) ×1 IMPLANT
SLED PULL BACK IVUS (MISCELLANEOUS) ×1 IMPLANT
STENT SYNERGY XD 3.0X38 (Permanent Stent) IMPLANT
SYNERGY XD 3.0X38 (Permanent Stent) ×2 IMPLANT
TRANSDUCER W/STOPCOCK (MISCELLANEOUS) ×2 IMPLANT
TUBING CIL FLEX 10 FLL-RA (TUBING) ×2 IMPLANT
VALVE COPILOT STAT (MISCELLANEOUS) ×1 IMPLANT
WIRE ASAHI PROWATER 180CM (WIRE) ×1 IMPLANT
WIRE EMERALD 3MM-J .035X150CM (WIRE) ×1 IMPLANT
WIRE EMERALD 3MM-J .035X260CM (WIRE) ×1 IMPLANT
WIRE HI TORQ VERSACORE-J 145CM (WIRE) ×1 IMPLANT

## 2021-06-04 NOTE — Progress Notes (Signed)
  Echocardiogram 2D Echocardiogram with 3D has been performed.  Wayne Williams M 06/04/2021, 9:08 AM

## 2021-06-04 NOTE — Progress Notes (Signed)
PROGRESS NOTE    Wayne Williams  YHC:[id number] DOB: 01-Nov-1951 DOA: 06/03/2021 PCP: Laurey Morale, MD    Chief Complaint  Patient presents with  . Shortness of Breath  . Chest Pain  . sob/ STEMI    Brief Narrative:   DECARLO RIVET is a 70 y.o. male with a hx of CAD s/p CABG 5V in 2001, heart failure reduced EF, none oxygen dependent COPD, active tobacco use, anxiety, CVA, type 2 diabetes, hypertension, BPH,  patient was admitted for shortness of breath, significant for NSTEMI and COPD exacerebration  Assessment & Plan:   Active Problems:   NSTEMI (non-ST elevated myocardial infarction) [facility])   NSTEMI -Cardiology input greatly appreciated, patient with chronic CAD, cardiac cath today showing moderate stenosisin the native LCx which was successfully stented but this was not an acute lesion. Borderline disease in the native RCA. Patent LIMA to the LAD. -Initially on heparin GTT, currently on dual antiplatelet therapy, will need to continue for 1 year.  COPD exacerbation/right lower lobe pneumonia -Presents with significant wheezing, this has improved, continue with IV steroids, was encouraged to use incentive spirometry and flutter valve, given significant productive phlegm, will start on Mucinex as well, continue with IV doxycycline and IV Rocephin.  Continue with bronchodilators.   Acute hypoxic respiratory failure secondary to COPD exacerbation/RLL PNA -Initially on 2 L nasal cannula, currently on room air  AKI likely prerenal in the setting of dehydration -Baseline creatinine appears to be 1.0 with GFR greater than 60. -On IV fluids precath protocol  Combined diastolic and systolic CHF Last 2D echo done on 01/16/2015 revealed LVEF 35 to 40% with grade 1 diastolic dysfunction. Strict I's and O's and daily weight Management per cardiology.  Type 2 diabetes with hyperglycemia -Hold metformin especially he received contrast with cardiac cath, continue with insulin  sliding scale.   DVT prophylaxis: Lovenox Code Status: Full Family Communication: Daughter at bedside Disposition:   Status is: Inpatient  Remains inpatient appropriate because:IV treatments appropriate due to intensity of illness or inability to take PO   Dispo:  Patient From: Home  Planned Disposition: Home  Medically stable for discharge: No         Consultants:   cardiology   Subjective:   He does report cough with a productive phlegm, he does report some dyspnea, currently denies any chest pain.  Objective: Vitals:   06/04/21 1115 06/04/21 1120 06/04/21 1125 06/04/21 1219  BP: 118/71 129/75 119/69 (!) 121/56  Pulse: (!) 105 92 94 95  Resp: 18 19 (!) 5 16  Temp:    98.1 F (36.7 C)  TempSrc:    Oral  SpO2: 98% 99% 97% 97%  Weight:        Intake/Output Summary (Last 24 hours) at 06/04/2021 1617 Last data filed at 06/04/2021 1349 Gross per 24 hour  Intake --  Output 100 ml  Net -100 ml   Filed Weights   06/03/21 1616  Weight: 58.1 kg    Examination:  General exam: Appears calm and comfortable , thin appearing Respiratory system: Mild wheezing, good air entry bilaterally Cardiovascular system: S1 & S2 heard, RRR. No JVD, murmurs, rubs, gallops or clicks. No pedal edema. Gastrointestinal system: Abdomen is nondistended, soft and nontender. No organomegaly or masses felt. Normal bowel sounds heard. Central nervous system: Alert and oriented. No focal neurological deficits. Extremities: Symmetric 5 x 5 power. Skin: No rashes, lesions or ulcers Psychiatry: Judgement and insight appear normal. Mood & affect appropriate.  Data Reviewed: I have personally reviewed following labs and imaging studies  CBC: Recent Labs  Lab 06/03/21 1613 06/03/21 1634 06/04/21 0643  WBC 17.4*  --  19.6*  NEUTROABS 14.8*  --   --   HGB 13.7 14.3 12.0*  HCT 42.1 42.0 36.4*  MCV 96.8  --  97.1  PLT 266  --  470    Basic Metabolic Panel: Recent Labs  Lab  06/03/21 1613 06/03/21 1634 06/04/21 0310  NA 138 139 137  K 4.3 4.2 4.4  CL 102  --  104  CO2 26  --  23  GLUCOSE 144*  --  235*  BUN 20  --  32*  CREATININE 1.37*  --  1.45*  CALCIUM 8.9  --  8.5*  MG  --   --  1.8  PHOS  --   --  2.7    GFR: Estimated Creatinine Clearance: 37.8 mL/min (A) (by C-G formula based on SCr of 1.45 mg/dL (H)).  Liver Function Tests: No results for input(s): AST, ALT, ALKPHOS, BILITOT, PROT, ALBUMIN in the last 168 hours.  CBG: Recent Labs  Lab 06/04/21 0426 06/04/21 0742  GLUCAP 190* 169*     Recent Results (from the past 240 hour(s))  Resp Panel by RT-PCR (Flu A&B, Covid) Nasopharyngeal Swab     Status: None   Collection Time: 06/03/21  4:13 PM   Specimen: Nasopharyngeal Swab; Nasopharyngeal(NP) swabs in vial transport medium  Result Value Ref Range Status   SARS Coronavirus 2 by RT PCR NEGATIVE NEGATIVE Final    Comment: (NOTE) SARS-CoV-2 target nucleic acids are NOT DETECTED.  The SARS-CoV-2 RNA is generally detectable in upper respiratory specimens during the acute phase of infection. The lowest concentration of SARS-CoV-2 viral copies this assay can detect is 138 copies/mL. A negative result does not preclude SARS-Cov-2 infection and should not be used as the sole basis for treatment or other patient management decisions. A negative result may occur with  improper specimen collection/handling, submission of specimen other than nasopharyngeal swab, presence of viral mutation(s) within the areas targeted by this assay, and inadequate number of viral copies(<138 copies/mL). A negative result must be combined with clinical observations, patient history, and epidemiological information. The expected result is Negative.  Fact Sheet for Patients:  EntrepreneurPulse.com.au  Fact Sheet for Healthcare Providers:  IncredibleEmployment.be  This test is no t yet approved or cleared by the [place]  FDA and  has been authorized for detection and/or diagnosis of SARS-CoV-2 by FDA under an Emergency Use Authorization (EUA). This EUA will remain  in effect (meaning this test can be used) for the duration of the COVID-19 declaration under Section 564(b)(1) of the Act, 21 U.S.C.section 360bbb-3(b)(1), unless the authorization is terminated  or revoked sooner.       Influenza A by PCR NEGATIVE NEGATIVE Final   Influenza B by PCR NEGATIVE NEGATIVE Final    Comment: (NOTE) The Xpert Xpress SARS-CoV-2/FLU/RSV plus assay is intended as an aid in the diagnosis of influenza from Nasopharyngeal swab specimens and should not be used as a sole basis for treatment. Nasal washings and aspirates are unacceptable for Xpert Xpress SARS-CoV-2/FLU/RSV testing.  Fact Sheet for Patients: EntrepreneurPulse.com.au  Fact Sheet for Healthcare Providers: IncredibleEmployment.be  This test is not yet approved or cleared by the [place] FDA and has been authorized for detection and/or diagnosis of SARS-CoV-2 by FDA under an Emergency Use Authorization (EUA). This EUA will remain in effect (meaning this test can be  used) for the duration of the COVID-19 declaration under Section 564(b)(1) of the Act, 21 U.S.C. section 360bbb-3(b)(1), unless the authorization is terminated or revoked.  Performed at [facility], [place] [address] [place], [place] [address]          Radiology Studies: CARDIAC CATHETERIZATION  Result Date: 06/04/2021  Prox RCA lesion is 60% stenosed. Unchanged from prior cath. Heavily calcified. SVG to PLA/PDA was occluded.  RPDA lesion is 90% stenosed.  Ost LM to Mid LM lesion is 40% stenosed. IVUS showed area of 4.83 mm2.  Ost LAD to Prox LAD lesion is 50% stenosed.  Prox LAD lesion is 100% stenosed. LIMA to LAD is patent  1st Diag lesion is 100% stenosed. SVG to diagonal is occluded. There are right to left collaterals.  2nd Mrg  lesion is 100% stenosed. SVG to OM is occluded there are right to left collaterals.  Prox Cx to Mid Cx lesion is 80% stenosed. IVUS showed area of 3.13 mm2.  A drug-eluting stent was successfully placed using a SYNERGY XD [id number], postdilated with a 3.75  balloon, and optimized with intravascular ultrasound.  Post intervention, there is a 0% residual stenosis.  LV end diastolic pressure is mildly elevated.  There is no aortic valve stenosis.  Although the left main cross-sectional area was less than 6 mm, I elected not to intervene since it was greater than 4 mm and the LAD is occluded with a LIMA to LAD. Given his ACS, will switch his clopidogrel to Brilinta.  If there are issues with either cost or bleeding, could go back to clopidogrel 300 mg x 1 followed by 75 mg daily. Continue aggressive secondary prevention for LV dysfunction. Moderate RCA lesion would likely require atherectomy given heavy calcification if intervention was required.  It did appear unchanged compared to 2016.   DG Chest Portable 1 View  Result Date: 06/03/2021 CLINICAL DATA:  Chest pain.  COPD exacerbation. EXAM: PORTABLE CHEST 1 VIEW COMPARISON:  Most recent radiograph 03/02/2021. FINDINGS: Post median sternotomy and CABG. Normal heart size. Aortic atherosclerosis. Chronic hyperinflation and bronchial thickening. Blunting of the left greater than right costophrenic angle, similar to prior exam. Minimal ill-defined opacity at the right lung base. No pneumothorax. No pulmonary edema. No acute osseous abnormalities are seen. IMPRESSION: 1. Minimal ill-defined opacity at the right lung base, atelectasis versus pneumonia. 2. Chronic hyperinflation and bronchial thickening, imaging findings consistent with COPD. 3. Blunting of the costophrenic angles appear similar, may be due to hyperinflation versus small effusions. Electronically Signed   By: Keith Rake M.D.   On: 06/03/2021 17:23   ECHOCARDIOGRAM COMPLETE  Result Date:  06/04/2021    ECHOCARDIOGRAM REPORT   Patient Name:   LORENZA WINKLEMAN Date of Exam: 06/04/2021 Medical Rec #:  [id number]      Height:       72.0 in Accession #:    [id number]     Weight:       128.0 lb Date of Birth:  07-08-1951      BSA:          1.762 m Patient Age:    26 years       BP:           113/59 mmHg Patient Gender: M              HR:           87 bpm. Exam Location:  Inpatient Procedure: 2D Echo, Cardiac Doppler, Color Doppler and  3D Echo Indications:    NSTEMI I21.4  History:        Patient has prior history of Echocardiogram examinations, most                 recent 01/16/2015. Acute MI and CAD, COPD, Aortic Valve Disease;                 Risk Factors:Hypertension and Current Smoker.  Sonographer:    Darlina Sicilian RDCS Referring Phys: [id number] Freada Bergeron  Sonographer Comments: Technically difficult study due to poor echo windows. Image acquisition challenging due to COPD. Global longitudinal strain was attempted. IMPRESSIONS  1. Left ventricular ejection fraction, by estimation, is 35 to 40%. Left ventricular ejection fraction by 3D volume is 35 %. The left ventricle has moderately decreased function. The left ventricle demonstrates global hypokinesis. There is mild concentric left ventricular hypertrophy. Left ventricular diastolic parameters are consistent with Grade I diastolic dysfunction (impaired relaxation). Elevated left atrial pressure. There is severe hypokinesis of the left ventricular, mid-apical inferolateral wall. There is severe hypokinesis of the left ventricular, basal-mid anteroseptal wall and inferoseptal wall. Findings suggest occluded native LAD artery with patent bypass to distal LAD and occluded arterial supply to OM/PLV territory.  2. Right ventricular systolic function is normal. The right ventricular size is normal. Tricuspid regurgitation signal is inadequate for assessing PA pressure.  3. Left atrial size was mildly dilated.  4. The mitral valve is normal in structure.  Mild to moderate mitral valve regurgitation.  5. The aortic valve is tricuspid. There is moderate calcification of the aortic valve. There is moderate thickening of the aortic valve. Aortic valve regurgitation is mild to moderate. Mild to moderate aortic valve sclerosis/calcification is present, without any evidence of aortic stenosis.  6. Aortic dilatation noted. There is borderline dilatation of the aortic root, measuring 37 mm. Comparison(s): Prior images unable to be directly viewed, comparison made by report only. The left ventricular function is unchanged. The left ventricular inferolateral wall motion abnormality is new. FINDINGS  Left Ventricle: Left ventricular ejection fraction, by estimation, is 35 to 40%. Left ventricular ejection fraction by 3D volume is 35 %. The left ventricle has moderately decreased function. The left ventricle demonstrates global hypokinesis. Severe hypokinesis of the left ventricular, mid-apical inferolateral wall. Severe hypokinesis of the left ventricular, basal-mid anteroseptal wall and inferoseptal wall. Global longitudinal strain performed but not reported based on interpreter judgement due to  suboptimal tracking. The left ventricular internal cavity size was normal in size. There is mild concentric left ventricular hypertrophy. Abnormal (paradoxical) septal motion, consistent with left bundle branch block. Left ventricular diastolic parameters are consistent with Grade I diastolic dysfunction (impaired relaxation). Elevated left atrial pressure.  LV Wall Scoring: Findings suggest occluded native LAD artery with patent bypass to distal LAD and occluded arterial supply to OM/PLV territory. Right Ventricle: The right ventricular size is normal. No increase in right ventricular wall thickness. Right ventricular systolic function is normal. Tricuspid regurgitation signal is inadequate for assessing PA pressure. Left Atrium: Left atrial size was mildly dilated. Right Atrium:  Right atrial size was normal in size. Pericardium: There is no evidence of pericardial effusion. Mitral Valve: The mitral valve is normal in structure. Mild to moderate mitral valve regurgitation, with centrally-directed jet. Tricuspid Valve: The tricuspid valve is normal in structure. Tricuspid valve regurgitation is not demonstrated. Aortic Valve: The aortic valve is tricuspid. There is moderate calcification of the aortic valve. There is moderate thickening of the aortic valve.  Aortic valve regurgitation is mild to moderate. Aortic regurgitation PHT measures 489 msec. Mild to moderate aortic valve sclerosis/calcification is present, without any evidence of aortic stenosis. Pulmonic Valve: The pulmonic valve was grossly normal. Pulmonic valve regurgitation is not visualized. Aorta: Aortic dilatation noted. There is borderline dilatation of the aortic root, measuring 37 mm. IAS/Shunts: No atrial level shunt detected by color flow Doppler.  LEFT VENTRICLE PLAX 2D LVIDd:         5.00 cm         Diastology LVIDs:         4.00 cm         LV e' medial:    3.59 cm/s LV PW:         1.10 cm         LV E/e' medial:  21.1 LV IVS:        1.20 cm         LV e' lateral:   7.07 cm/s LVOT diam:     2.40 cm         LV E/e' lateral: 10.7 LV SV:         86 LV SV Index:   49 LVOT Area:     4.52 cm        3D Volume EF                                LV 3D EF:    Left                                             ventricular                                             ejection                                             fraction by                                             3D volume                                             is 35 %.                                 3D Volume EF:                                3D EF:        35 % RIGHT VENTRICLE RV S prime:     11.60 cm/s TAPSE (M-mode): 1.3 cm LEFT ATRIUM             Index  RIGHT ATRIUM          Index LA diam:        3.70 cm 2.10 cm/m  RA Area:     9.30 cm LA Vol (A2C):   46.0  ml 26.10 ml/m RA Volume:   20.60 ml 11.69 ml/m LA Vol (A4C):   43.1 ml 24.45 ml/m LA Biplane Vol: 45.5 ml 25.82 ml/m  AORTIC VALVE LVOT Vmax:   91.70 cm/s LVOT Vmean:  67.800 cm/s LVOT VTI:    0.190 m AI PHT:      489 msec  AORTA Ao Root diam: 3.70 cm MITRAL VALVE MV Area (PHT): 5.66 cm    SHUNTS MV Decel Time: 134 msec    Systemic VTI:  0.19 m MV E velocity: 75.80 cm/s  Systemic Diam: 2.40 cm MV A velocity: 92.50 cm/s MV E/A ratio:  0.82 Mihai Croitoru MD Electronically signed by Sanda Klein MD Signature Date/Time: 06/04/2021/9:43:26 AM    Final         Scheduled Meds: . Derrill Memo ON 06/05/2021] aspirin  81 mg Oral Daily  . fluticasone furoate-vilanterol  1 puff Inhalation QHS  . insulin aspart  0-9 Units Subcutaneous Q4H  . ipratropium-albuterol  3 mL Nebulization BID  . methylPREDNISolone (SOLU-MEDROL) injection  40 mg Intravenous Q8H  . rosuvastatin  20 mg Oral Daily  . sodium chloride flush  3 mL Intravenous Q12H  . sodium chloride flush  3 mL Intravenous Q12H  . tamsulosin  0.4 mg Oral Daily  . ticagrelor  90 mg Oral BID   Continuous Infusions: . sodium chloride    . cefTRIAXone (ROCEPHIN)  IV       LOS: 1 day      Phillips Climes, MD [facility]   To contact the attending provider between 7A-7P or the covering provider during after hours 7P-7A, please log into the web site [email or web address] and access using universal [place] password for that web site. If you do not have the password, please call the hospital operator.  06/04/2021, 4:17 PM

## 2021-06-04 NOTE — Plan of Care (Signed)

## 2021-06-04 NOTE — Progress Notes (Signed)
Progress Note  Patient Name: Wayne Williams Date of Encounter: 06/04/2021  [facility] Cardiologist: Dr Percival Spanish  Subjective   Feels better today, no chest pain. Breathing improved. Notes he has had progressive SOB with cough recently. Continues to smoke.   Inpatient Medications    Scheduled Meds: . [START ON 06/05/2021] aspirin  81 mg Oral Daily  . fluticasone furoate-vilanterol  1 puff Inhalation QHS  . insulin aspart  0-9 Units Subcutaneous Q4H  . ipratropium-albuterol  3 mL Nebulization Q6H  . methylPREDNISolone (SOLU-MEDROL) injection  40 mg Intravenous Q8H  . rosuvastatin  20 mg Oral Daily  . sodium chloride flush  3 mL Intravenous Q12H  . sodium chloride flush  3 mL Intravenous Q12H  . tamsulosin  0.4 mg Oral Daily  . ticagrelor  90 mg Oral BID   Continuous Infusions: . sodium chloride    . sodium chloride    . cefTRIAXone (ROCEPHIN)  IV     PRN Meds: sodium chloride, acetaminophen, ALPRAZolam, hydrALAZINE, labetalol, nitroGLYCERIN, ondansetron (ZOFRAN) IV, sodium chloride flush   Vital Signs    Vitals:   06/04/21 1115 06/04/21 1120 06/04/21 1125 06/04/21 1219  BP: 118/71 129/75 119/69 (!) 121/56  Pulse: (!) 105 92 94 95  Resp: 18 19 (!) 5 16  Temp:    98.1 F (36.7 C)  TempSrc:    Oral  SpO2: 98% 99% 97% 97%  Weight:       No intake or output data in the 24 hours ending 06/04/21 1256 Last 3 Weights 06/03/2021 05/20/2021 04/02/2021  Weight (lbs) 128 lb 129 lb 128 lb 6.4 oz  Weight (kg) 58.06 kg 58.514 kg 58.242 kg      Telemetry    NSR - Personally Reviewed  ECG    NSR with IVCD- LBBB type - Personally Reviewed  Physical Exam   GEN: Thin chronically ill appearing WM in No acute distress.   Neck: No JVD Cardiac: RRR, no murmurs, rubs, or gallops.  Respiratory: few wheezes GI: Soft, nontender, non-distended  MS: No edema; No deformity. Cath site OK Neuro:  Nonfocal  Psych: Normal affect   Labs    High Sensitivity Troponin:   Recent Labs   Lab 06/03/21 1639 06/03/21 1820  TROPONINIHS 2,767* 3,942*      Chemistry Recent Labs  Lab 06/03/21 1613 06/03/21 1634 06/04/21 0310  NA 138 139 137  K 4.3 4.2 4.4  CL 102  --  104  CO2 26  --  23  GLUCOSE 144*  --  235*  BUN 20  --  32*  CREATININE 1.37*  --  1.45*  CALCIUM 8.9  --  8.5*  GFRNONAA 55*  --  51*  ANIONGAP 10  --  10     Hematology Recent Labs  Lab 06/03/21 1613 06/03/21 1634 06/04/21 0643  WBC 17.4*  --  19.6*  RBC 4.35  --  3.75*  HGB 13.7 14.3 12.0*  HCT 42.1 42.0 36.4*  MCV 96.8  --  97.1  MCH 31.5  --  32.0  MCHC 32.5  --  33.0  RDW 14.7  --  14.8  PLT 266  --  195    BNP Recent Labs  Lab 06/03/21 1639  BNP 296.3*     DDimer No results for input(s): DDIMER in the last 168 hours.   Radiology    CARDIAC CATHETERIZATION  Result Date: 06/04/2021  Prox RCA lesion is 60% stenosed. Unchanged from prior cath. Heavily calcified. SVG to  PLA/PDA was occluded.  RPDA lesion is 90% stenosed.  Ost LM to Mid LM lesion is 40% stenosed. IVUS showed area of 4.83 mm2.  Ost LAD to Prox LAD lesion is 50% stenosed.  Prox LAD lesion is 100% stenosed. LIMA to LAD is patent  1st Diag lesion is 100% stenosed. SVG to diagonal is occluded. There are right to left collaterals.  2nd Mrg lesion is 100% stenosed. SVG to OM is occluded there are right to left collaterals.  Prox Cx to Mid Cx lesion is 80% stenosed. IVUS showed area of 3.13 mm2.  A drug-eluting stent was successfully placed using a SYNERGY XD 3.0X38, postdilated with a 3.75 Sherwood balloon, and optimized with intravascular ultrasound.  Post intervention, there is a 0% residual stenosis.  LV end diastolic pressure is mildly elevated.  There is no aortic valve stenosis.  Although the left main cross-sectional area was less than 6 mm, I elected not to intervene since it was greater than 4 mm and the LAD is occluded with a LIMA to LAD. Given his ACS, will switch his clopidogrel to Brilinta.  If there are  issues with either cost or bleeding, could go back to clopidogrel 300 mg x 1 followed by 75 mg daily. Continue aggressive secondary prevention for LV dysfunction. Moderate RCA lesion would likely require atherectomy given heavy calcification if intervention was required.  It did appear unchanged compared to 2016.   DG Chest Portable 1 View  Result Date: 06/03/2021 CLINICAL DATA:  Chest pain.  COPD exacerbation. EXAM: PORTABLE CHEST 1 VIEW COMPARISON:  Most recent radiograph 03/02/2021. FINDINGS: Post median sternotomy and CABG. Normal heart size. Aortic atherosclerosis. Chronic hyperinflation and bronchial thickening. Blunting of the left greater than right costophrenic angle, similar to prior exam. Minimal ill-defined opacity at the right lung base. No pneumothorax. No pulmonary edema. No acute osseous abnormalities are seen. IMPRESSION: 1. Minimal ill-defined opacity at the right lung base, atelectasis versus pneumonia. 2. Chronic hyperinflation and bronchial thickening, imaging findings consistent with COPD. 3. Blunting of the costophrenic angles appear similar, may be due to hyperinflation versus small effusions. Electronically Signed   By: Keith Rake M.D.   On: 06/03/2021 17:23   ECHOCARDIOGRAM COMPLETE  Result Date: 06/04/2021    ECHOCARDIOGRAM REPORT   Patient Name:   Wayne Williams Date of Exam: 06/04/2021 Medical Rec #:  [id number]      Height:       72.0 in Accession #:    [id number]     Weight:       128.0 lb Date of Birth:  1951/09/01      BSA:          1.762 m Patient Age:    70 years       BP:           113/59 mmHg Patient Gender: M              HR:           87 bpm. Exam Location:  Inpatient Procedure: 2D Echo, Cardiac Doppler, Color Doppler and 3D Echo Indications:    NSTEMI I21.4  History:        Patient has prior history of Echocardiogram examinations, most                 recent 01/16/2015. Acute MI and CAD, COPD, Aortic Valve Disease;                 Risk Factors:Hypertension and Current  Smoker.  Sonographer:    Darlina Sicilian RDCS Referring Phys: [id number] Freada Bergeron  Sonographer Comments: Technically difficult study due to poor echo windows. Image acquisition challenging due to COPD. Global longitudinal strain was attempted. IMPRESSIONS  1. Left ventricular ejection fraction, by estimation, is 35 to 40%. Left ventricular ejection fraction by 3D volume is 35 %. The left ventricle has moderately decreased function. The left ventricle demonstrates global hypokinesis. There is mild concentric left ventricular hypertrophy. Left ventricular diastolic parameters are consistent with Grade I diastolic dysfunction (impaired relaxation). Elevated left atrial pressure. There is severe hypokinesis of the left ventricular, mid-apical inferolateral wall. There is severe hypokinesis of the left ventricular, basal-mid anteroseptal wall and inferoseptal wall. Findings suggest occluded native LAD artery with patent bypass to distal LAD and occluded arterial supply to OM/PLV territory.  2. Right ventricular systolic function is normal. The right ventricular size is normal. Tricuspid regurgitation signal is inadequate for assessing PA pressure.  3. Left atrial size was mildly dilated.  4. The mitral valve is normal in structure. Mild to moderate mitral valve regurgitation.  5. The aortic valve is tricuspid. There is moderate calcification of the aortic valve. There is moderate thickening of the aortic valve. Aortic valve regurgitation is mild to moderate. Mild to moderate aortic valve sclerosis/calcification is present, without any evidence of aortic stenosis.  6. Aortic dilatation noted. There is borderline dilatation of the aortic root, measuring 37 mm. Comparison(s): Prior images unable to be directly viewed, comparison made by report only. The left ventricular function is unchanged. The left ventricular inferolateral wall motion abnormality is new. FINDINGS  Left Ventricle: Left ventricular ejection  fraction, by estimation, is 35 to 40%. Left ventricular ejection fraction by 3D volume is 35 %. The left ventricle has moderately decreased function. The left ventricle demonstrates global hypokinesis. Severe hypokinesis of the left ventricular, mid-apical inferolateral wall. Severe hypokinesis of the left ventricular, basal-mid anteroseptal wall and inferoseptal wall. Global longitudinal strain performed but not reported based on interpreter judgement due to  suboptimal tracking. The left ventricular internal cavity size was normal in size. There is mild concentric left ventricular hypertrophy. Abnormal (paradoxical) septal motion, consistent with left bundle branch block. Left ventricular diastolic parameters are consistent with Grade I diastolic dysfunction (impaired relaxation). Elevated left atrial pressure.  LV Wall Scoring: Findings suggest occluded native LAD artery with patent bypass to distal LAD and occluded arterial supply to OM/PLV territory. Right Ventricle: The right ventricular size is normal. No increase in right ventricular wall thickness. Right ventricular systolic function is normal. Tricuspid regurgitation signal is inadequate for assessing PA pressure. Left Atrium: Left atrial size was mildly dilated. Right Atrium: Right atrial size was normal in size. Pericardium: There is no evidence of pericardial effusion. Mitral Valve: The mitral valve is normal in structure. Mild to moderate mitral valve regurgitation, with centrally-directed jet. Tricuspid Valve: The tricuspid valve is normal in structure. Tricuspid valve regurgitation is not demonstrated. Aortic Valve: The aortic valve is tricuspid. There is moderate calcification of the aortic valve. There is moderate thickening of the aortic valve. Aortic valve regurgitation is mild to moderate. Aortic regurgitation PHT measures 489 msec. Mild to moderate aortic valve sclerosis/calcification is present, without any evidence of aortic stenosis.  Pulmonic Valve: The pulmonic valve was grossly normal. Pulmonic valve regurgitation is not visualized. Aorta: Aortic dilatation noted. There is borderline dilatation of the aortic root, measuring 37 mm. IAS/Shunts: No atrial level shunt detected by color flow Doppler.  LEFT VENTRICLE PLAX 2D  LVIDd:         5.00 cm         Diastology LVIDs:         4.00 cm         LV e' medial:    3.59 cm/s LV PW:         1.10 cm         LV E/e' medial:  21.1 LV IVS:        1.20 cm         LV e' lateral:   7.07 cm/s LVOT diam:     2.40 cm         LV E/e' lateral: 10.7 LV SV:         86 LV SV Index:   49 LVOT Area:     4.52 cm        3D Volume EF                                LV 3D EF:    Left                                             ventricular                                             ejection                                             fraction by                                             3D volume                                             is 35 %.                                 3D Volume EF:                                3D EF:        35 % RIGHT VENTRICLE RV S prime:     11.60 cm/s TAPSE (M-mode): 1.3 cm LEFT ATRIUM             Index       RIGHT ATRIUM          Index LA diam:        3.70 cm 2.10 cm/m  RA Area:     9.30 cm LA Vol (A2C):   46.0 ml 26.10 ml/m RA Volume:   20.60 ml 11.69 ml/m LA Vol (A4C):   43.1 ml 24.45 ml/m LA Biplane Vol: 45.5 ml 25.82 ml/m  AORTIC VALVE LVOT Vmax:   91.70 cm/s LVOT Vmean:  67.800 cm/s LVOT VTI:    0.190 m AI PHT:      489 msec  AORTA Ao Root diam: 3.70 cm MITRAL VALVE MV Area (PHT): 5.66 cm    SHUNTS MV Decel Time: 134 msec    Systemic VTI:  0.19 m MV E velocity: 75.80 cm/s  Systemic Diam: 2.40 cm MV A velocity: 92.50 cm/s MV E/A ratio:  0.82 Mihai Croitoru MD Electronically signed by Sanda Klein MD Signature Date/Time: 06/04/2021/9:43:26 AM    Final     Cardiac Studies   As noted.   Patient Profile     70 y.o. male with history of CAD s/p remote CABG in 2001,  tobacco abuse, chronic systolic CHF with EF 37%, CKD, DM, and HLD. Presents with progressive SOB and cough. COPD exacerbation. Noted to have elevated troponin  Assessment & Plan    1. Acute on chronic respiratory failure with hypoxia. COPD exacerbation. Possible RLL PNA. 2. Elevated troponin. Suspect this is demand ischemia is setting of #1. Patient has chronic CAD. Cardiac cath today showed moderate stenosis in the native LCx which was successfully stented but this was not an acute lesion. Borderline disease in the native RCA. Patent LIMA to the LAD. Needs DAPT for one year.  3. Chronic systolic CHF. EF 35% is unchanged. EDP at cath 18 mm Hgb. Beta blocker on hold due to acute COPD exacerbation. Consider ARNI, RAAS inhibition once renal function improves 4. DM  5. Tobacco abuse. Stressed importance of smoking cessation. 6. HLD. LDL 49 on statin.      For questions or updates, please contact [place] Please consult [email or web address] for contact info under        Signed, Marchetta Navratil [place], MD  06/04/2021, 12:56 PM

## 2021-06-04 NOTE — ED Notes (Signed)
Obtained consent for cardiac cath

## 2021-06-04 NOTE — Interval H&P Note (Signed)
Cath Lab Visit (complete for each Cath Lab visit)  Clinical Evaluation Leading to the Procedure:   ACS: Yes.    Non-ACS:    Anginal Classification: CCS IV  Anti-ischemic medical therapy: Minimal Therapy (1 class of medications)  Non-Invasive Test Results: No non-invasive testing performed  Prior CABG: No previous CABG      History and Physical Interval Note:  06/04/2021 9:56 AM  Wayne Williams  has presented today for surgery, with the diagnosis of NSTEMI.  The various methods of treatment have been discussed with the patient and family. After consideration of risks, benefits and other options for treatment, the patient has consented to  Procedure(s): LEFT HEART CATH AND CORS/GRAFTS ANGIOGRAPHY (N/A) as a surgical intervention.  The patient's history has been reviewed, patient examined, no change in status, stable for surgery.  I have reviewed the patient's chart and labs.  Questions were answered to the patient's satisfaction.     Larae Grooms

## 2021-06-04 NOTE — Progress Notes (Signed)
Site area: right groin  Site Prior to Removal:  Level 0  Pressure Applied For 25 MINUTES    Minutes Beginning at 1655  Manual:   Yes.    Patient Status During Pull:  stable  Post Pull Groin Site:  Level 0  Post Pull Instructions Given:  Yes.    Post Pull Pulses Present:  Yes.    Dressing Applied:  Yes.    Comments:  Bedrest started at [place]

## 2021-06-04 NOTE — Progress Notes (Signed)
ANTICOAGULATION CONSULT NOTE   Pharmacy Consult for Heparin Indication: chest pain/ACS  No Known Allergies  Patient Measurements: Weight: 58.1 kg (128 lb) Heparin Dosing Weight: 58 kg   Vital Signs: Temp: 98.6 F (37 C) (06/06 1936) Temp Source: Oral (06/06 1936) BP: 114/59 (06/07 0600) Pulse Rate: 78 (06/07 0600)  Labs: Recent Labs    06/03/21 1613 06/03/21 1634 06/03/21 1639 06/03/21 1820 06/04/21 0310  HGB 13.7 14.3  --   --   --   HCT 42.1 42.0  --   --   --   PLT 266  --   --   --   --   LABPROT  --   --   --   --  15.7*  INR  --   --   --   --  1.3*  HEPARINUNFRC  --   --   --   --  <0.10*  CREATININE 1.37*  --   --   --  1.45*  TROPONINIHS  --   --  2,767* 3,942*  --     Estimated Creatinine Clearance: 37.8 mL/min (A) (by C-G formula based on SCr of 1.45 mg/dL (H)).   Medical History: Past Medical History:  Diagnosis Date  . Anxiety   . CAD (coronary artery disease)    s/p cabg x 4  . Cerebrovascular accident ([place])    hx of 06 20 09  . Depression    saw Dr. Casimiro Needle in the past  . Diabetes mellitus   . ED (erectile dysfunction)   . GERD (gastroesophageal reflux disease)   . Hypertension   . Insomnia     Medications:  (Not in a hospital admission)   Assessment: 20 YOM with chest pain concerning for ACS. Troponins elevated. H/H and Plt wnl. SCr 1.37  6/7 AM update: Heparin level low  Goal of Therapy:  Heparin level 0.3-0.7 units/ml Monitor platelets by anticoagulation protocol: Yes   Plan:  -Heparin 2000 units bolus, then increase heparin drip to 900 units/hr -F/u 8 hr HL -Monitor daily HL, CBC and s/s of bleeding   Narda Bonds, PharmD, BCPS Clinical Pharmacist Phone: [phone]

## 2021-06-05 ENCOUNTER — Other Ambulatory Visit (HOSPITAL_COMMUNITY): Payer: Self-pay

## 2021-06-05 ENCOUNTER — Inpatient Hospital Stay (HOSPITAL_COMMUNITY): Payer: Medicare PPO

## 2021-06-05 DIAGNOSIS — J441 Chronic obstructive pulmonary disease with (acute) exacerbation: Secondary | ICD-10-CM

## 2021-06-05 DIAGNOSIS — E43 Unspecified severe protein-calorie malnutrition: Secondary | ICD-10-CM | POA: Insufficient documentation

## 2021-06-05 LAB — BASIC METABOLIC PANEL
Anion gap: 9 (ref 5–15)
BUN: 47 mg/dL — ABNORMAL HIGH (ref 8–23)
CO2: 25 mmol/L (ref 22–32)
Calcium: 8.6 mg/dL — ABNORMAL LOW (ref 8.9–10.3)
Chloride: 104 mmol/L (ref 98–111)
Creatinine, Ser: 1.25 mg/dL — ABNORMAL HIGH (ref 0.61–1.24)
GFR, Estimated: >60 mL/min (ref >60)
Glucose, Bld: 153 mg/dL — ABNORMAL HIGH (ref 70–99)
Potassium: 4.1 mmol/L (ref 3.5–5.1)
Sodium: 138 mmol/L (ref 135–145)

## 2021-06-05 LAB — POCT ACTIVATED CLOTTING TIME
Activated Clotting Time: 300 seconds
Activated Clotting Time: 335 seconds

## 2021-06-05 LAB — GLUCOSE, CAPILLARY
Glucose-Capillary: 173 mg/dL — ABNORMAL HIGH (ref 70–99)
Glucose-Capillary: 174 mg/dL — ABNORMAL HIGH (ref 70–99)
Glucose-Capillary: 155 mg/dL — ABNORMAL HIGH (ref 70–99)
Glucose-Capillary: 151 mg/dL — ABNORMAL HIGH (ref 70–99)
Glucose-Capillary: 153 mg/dL — ABNORMAL HIGH (ref 70–99)
Glucose-Capillary: 288 mg/dL — ABNORMAL HIGH (ref 70–99)

## 2021-06-05 LAB — CBC
HCT: 33.1 % — ABNORMAL LOW (ref 39.0–52.0)
Hemoglobin: 10.8 g/dL — ABNORMAL LOW (ref 13.0–17.0)
MCH: 31.6 pg (ref 26.0–34.0)
MCHC: 32.6 g/dL (ref 30.0–36.0)
MCV: 96.8 fL (ref 80.0–100.0)
Platelets: 212 10*3/uL (ref 150–400)
RBC: 3.42 MIL/uL — ABNORMAL LOW (ref 4.22–5.81)
RDW: 14.8 % (ref 11.5–15.5)
WBC: 18.9 10*3/uL — ABNORMAL HIGH (ref 4.0–10.5)
nRBC: 0 % (ref 0.0–0.2)

## 2021-06-05 LAB — BRAIN NATRIURETIC PEPTIDE
B Natriuretic Peptide: 723.8 pg/mL — ABNORMAL HIGH (ref 0.0–100.0)
B Natriuretic Peptide: 1133.8 pg/mL — ABNORMAL HIGH (ref 0.0–100.0)

## 2021-06-05 LAB — PROCALCITONIN: Procalcitonin: 51.91 ng/mL

## 2021-06-05 MED ORDER — ALPRAZOLAM 0.5 MG PO TABS
2.0000 mg | ORAL_TABLET | Freq: Every day | ORAL | Status: DC
  Administered 2021-06-06: 2 mg via ORAL
  Filled 2021-06-05: qty 4

## 2021-06-05 MED ORDER — AEROCHAMBER PLUS FLO-VU MEDIUM MISC
1.0000 | Freq: Once | Status: AC
  Administered 2021-06-05: 1
  Filled 2021-06-05: qty 1

## 2021-06-05 MED ORDER — IPRATROPIUM-ALBUTEROL 0.5-2.5 (3) MG/3ML IN SOLN
3.0000 mL | Freq: Three times a day (TID) | RESPIRATORY_TRACT | Status: DC
  Administered 2021-06-05 – 2021-06-06 (×3): 3 mL via RESPIRATORY_TRACT
  Filled 2021-06-05 (×3): qty 3

## 2021-06-05 MED ORDER — ALPRAZOLAM 0.5 MG PO TABS: 1.0000 mg | ORAL_TABLET | Freq: Once | ORAL | Status: AC

## 2021-06-05 MED ORDER — FUROSEMIDE 10 MG/ML IJ SOLN
20.0000 mg | Freq: Once | INTRAMUSCULAR | Status: AC
  Administered 2021-06-05: 20 mg via INTRAVENOUS
  Filled 2021-06-05: qty 2

## 2021-06-05 MED ORDER — CLOPIDOGREL BISULFATE 75 MG PO TABS
75.0000 mg | ORAL_TABLET | Freq: Every day | ORAL | Status: DC
  Administered 2021-06-06: 75 mg via ORAL
  Filled 2021-06-05 (×2): qty 1

## 2021-06-05 MED ORDER — CLOPIDOGREL BISULFATE 75 MG PO TABS
600.0000 mg | ORAL_TABLET | Freq: Once | ORAL | Status: AC
  Administered 2021-06-05: 600 mg via ORAL
  Filled 2021-06-05: qty 8

## 2021-06-05 MED ORDER — ALPRAZOLAM 0.5 MG PO TABS: 2.0000 mg | ORAL_TABLET | Freq: Every day | ORAL | Status: DC

## 2021-06-05 MED ORDER — MOMETASONE FURO-FORMOTEROL FUM 200-5 MCG/ACT IN AERO
2.0000 | INHALATION_SPRAY | Freq: Two times a day (BID) | RESPIRATORY_TRACT | Status: DC
  Administered 2021-06-06: 2 via RESPIRATORY_TRACT
  Filled 2021-06-05: qty 8.8

## 2021-06-05 MED ORDER — DOXYCYCLINE HYCLATE 100 MG PO TABS
100.0000 mg | ORAL_TABLET | Freq: Two times a day (BID) | ORAL | Status: DC
  Administered 2021-06-05 – 2021-06-06 (×2): 100 mg via ORAL
  Filled 2021-06-05 (×3): qty 1

## 2021-06-05 MED ORDER — BISOPROLOL FUMARATE 5 MG PO TABS
2.5000 mg | ORAL_TABLET | Freq: Every day | ORAL | Status: DC
  Administered 2021-06-05 – 2021-06-06 (×2): 2.5 mg via ORAL
  Filled 2021-06-05 (×3): qty 1

## 2021-06-05 MED ORDER — PREDNISONE 20 MG PO TABS
40.0000 mg | ORAL_TABLET | Freq: Every day | ORAL | Status: DC
  Administered 2021-06-06: 40 mg via ORAL
  Filled 2021-06-05: qty 2

## 2021-06-05 MED ORDER — ALPRAZOLAM 0.5 MG PO TABS
1.0000 mg | ORAL_TABLET | Freq: Three times a day (TID) | ORAL | Status: DC
  Administered 2021-06-05: 1 mg via ORAL
  Filled 2021-06-05: qty 2

## 2021-06-05 MED ORDER — ENSURE ENLIVE PO LIQD: 237.0000 mL | Freq: Three times a day (TID) | ORAL | Status: DC

## 2021-06-05 MED ORDER — IPRATROPIUM-ALBUTEROL 0.5-2.5 (3) MG/3ML IN SOLN
3.0000 mL | RESPIRATORY_TRACT | Status: DC
  Administered 2021-06-05: 3 mL via RESPIRATORY_TRACT
  Filled 2021-06-05: qty 3

## 2021-06-05 MED ORDER — METOPROLOL SUCCINATE ER 25 MG PO TB24: 12.5000 mg | ORAL_TABLET | Freq: Every day | ORAL | Status: DC

## 2021-06-05 MED ORDER — ADULT MULTIVITAMIN W/MINERALS CH
1.0000 | ORAL_TABLET | Freq: Every day | ORAL | Status: DC
  Administered 2021-06-05 – 2021-06-06 (×2): 1 via ORAL
  Filled 2021-06-05 (×3): qty 1

## 2021-06-05 MED ORDER — ALBUTEROL SULFATE (2.5 MG/3ML) 0.083% IN NEBU: 2.5000 mg | INHALATION_SOLUTION | RESPIRATORY_TRACT | Status: DC | PRN

## 2021-06-05 NOTE — Progress Notes (Signed)
PT Cancellation Note  Patient Details Name: Wayne Williams MRN: [id number] DOB: 02-17-1951   Cancelled Treatment:    Reason Eval/Treat Not Completed: Other (comment) - pt declining PT/OT evaluations today, reports, "I just want to rest today." RN aware. Will follow-up for PT Evaluation tomorrow.  Mabeline Caras, PT, DPT Acute Rehabilitation Services  Pager [phone] Office [place] 06/05/2021, 11:14 AM

## 2021-06-05 NOTE — Progress Notes (Signed)
OT Cancellation Note  Patient Details Name: Wayne Williams MRN: [id number] DOB: 1951-11-12   Cancelled Treatment:    Reason Eval/Treat Not Completed: Other (comment). Pt reports "I haven't gotten any good rest since I got here and I just want to rest today". Explained getting up and moving even some will help increase his endurance for activity--he verbalized understanding and politely made it known he only wanted to rest today.  [place], OTR/L Acute Rehab Services Pager [phone] Office [phone]     Almon Register 06/05/2021, 11:16 AM

## 2021-06-05 NOTE — Care Management (Signed)
06-05-21 1546 Case Manager received a consult regarding patient having multiple concerns regarding medication cost and affordability. Case Manager spoke with the patient regarding medications/cost and the patient uses [facility] [address]. Patient states Wayne Williams has not had issues affording medications in the past except for inhalers at the beginning of the year. Daughter Wayne Williams is at the bedside and she states her father has tried Chantix in the past and the patient did not like the way the medication made him feel; therefore, Wayne Williams stopped taking it. Patient lives in a townhome and per daughter the patient still drives short distances. Daughter states she provides groceries and checks in on him periodically. Case Manager discussed home health needs for the patient and the daughter felt like patient would not let a home health agency in if recommendations were made for home health services. Case Manager will continue to follow for transition of care needs.

## 2021-06-05 NOTE — Progress Notes (Addendum)
HOSPITAL MEDICINE OVERNIGHT EVENT NOTE    Notified by nursing the patient is exhibiting sudden increasing shortness of breath.  Patient is a known history of advanced COPD however patient is not exhibiting significant wheezing.  Stat chest x-ray obtained revealing mild edema particularly in the left lung fields.  Of note patient has been receiving some intravenous fluids for treatment of acute kidney injury.  Patient is being provided a nebulized bronchodilator.  We will additionally provide patient with 20 mg of intravenous Lasix.  Monitoring for symptomatic improvement.  Wayne Emerald  MD [facility]   ADDENDUM (6/8 5:35am)  Patient evaluated at the bedside.  Patient reports he is starting to feel better after administration of nebulized treatment, oxygen and Lasix.    Lungs exhibit prolonged expiratory phase with expiratory wheezing and bibasilar rales.  Continue to monitor closely.  Wayne Williams

## 2021-06-05 NOTE — TOC Benefit Eligibility Note (Signed)
Patient Teacher, English as a foreign language completed.    The patient is currently admitted and upon discharge could be taking Brilinta 90 mg.  The current 30 day co-pay is, $47.00   The patient is currently admitted and upon discharge could be taking Farxiga 10 mg.  The current 30 day co-pay is, $99.00   The patient is currently admitted and upon discharge could be taking Entresto 24mg /26 mg.  The current 30 day co-pay is, $47.00   The patient is currently admitted and upon discharge could be taking Jardiance 10 mg.  The current 30 day co-pay is, $47.00    The patient is insured through [place] Patient Advocate Specialist [place] Team Direct Number: [phone]  Fax: [phone]

## 2021-06-05 NOTE — Progress Notes (Signed)
Progress Note  Patient Name: Wayne Williams Date of Encounter: 06/05/2021  [facility] Cardiologist: None   Subjective   Events of this morning noted. He is very concerned with remaining on Brilinta with his COPD issues. Still feeling short of breath this morning, but improved from earlier. Tachycardiac, no chest pain.   Inpatient Medications    Scheduled Meds: . aspirin  81 mg Oral Daily  . enoxaparin (LOVENOX) injection  40 mg Subcutaneous Daily  . fluticasone furoate-vilanterol  1 puff Inhalation QHS  . guaiFENesin  1,200 mg Oral BID  . insulin aspart  0-9 Units Subcutaneous Q4H  . ipratropium-albuterol  3 mL Nebulization Q4H  . methylPREDNISolone (SOLU-MEDROL) injection  40 mg Intravenous Q8H  . pantoprazole  40 mg Oral Daily  . rosuvastatin  20 mg Oral Daily  . sodium chloride flush  3 mL Intravenous Q12H  . sodium chloride flush  3 mL Intravenous Q12H  . tamsulosin  0.4 mg Oral Daily  . ticagrelor  90 mg Oral BID   Continuous Infusions: . sodium chloride    . cefTRIAXone (ROCEPHIN)  IV 2 g (06/04/21 2035)  . doxycycline (VIBRAMYCIN) IV 100 mg (06/05/21 0523)   PRN Meds: sodium chloride, acetaminophen, ALPRAZolam, nitroGLYCERIN, ondansetron (ZOFRAN) IV, sodium chloride flush   Vital Signs    Vitals:   06/04/21 1917 06/04/21 1919 06/05/21 0405 06/05/21 0600  BP: 132/73  134/67 115/86  Pulse: (!) 107  88 (!) 120  Resp: 16  (!) 24 (!) 22  Temp: 98.2 F (36.8 C)  97.8 F (36.6 C) 98 F (36.7 C)  TempSrc: Oral  Oral Oral  SpO2: 97% 96% 95% 93%  Weight:        Intake/Output Summary (Last 24 hours) at 06/05/2021 0757 Last data filed at 06/05/2021 0600 Gross per 24 hour  Intake --  Output 600 ml  Net -600 ml   Last 3 Weights 06/03/2021 05/20/2021 04/02/2021  Weight (lbs) 128 lb 129 lb 128 lb 6.4 oz  Weight (kg) 58.06 kg 58.514 kg 58.242 kg      Telemetry    ST rates 100-120bpm - Personally Reviewed  ECG    ST, 116 bpm, IVCD (LBBB), ST depression in  inferolateral leads- Personally Reviewed  Physical Exam   GEN: Very thin, frail male.    Neck: No JVD Cardiac: Tachycardiac, no murmurs, rubs, or gallops.  Respiratory: Diminished throughout, expiratory wheezing. GI: Soft, nontender, non-distended  MS: No edema; No deformity. Neuro:  Nonfocal  Psych: Normal affect   Labs    High Sensitivity Troponin:   Recent Labs  Lab 06/03/21 1639 06/03/21 1820  TROPONINIHS 2,767* 3,942*      Chemistry Recent Labs  Lab 06/03/21 1613 06/03/21 1634 06/04/21 0310 06/05/21 0301  NA 138 139 137 138  K 4.3 4.2 4.4 4.1  CL 102  --  104 104  CO2 26  --  23 25  GLUCOSE 144*  --  235* 153*  BUN 20  --  32* 47*  CREATININE 1.37*  --  1.45* 1.25*  CALCIUM 8.9  --  8.5* 8.6*  GFRNONAA 55*  --  51* >60  ANIONGAP 10  --  10 9     Hematology Recent Labs  Lab 06/03/21 1613 06/03/21 1634 06/04/21 0643 06/05/21 0301  WBC 17.4*  --  19.6* 18.9*  RBC 4.35  --  3.75* 3.42*  HGB 13.7 14.3 12.0* 10.8*  HCT 42.1 42.0 36.4* 33.1*  MCV 96.8  --  97.1 96.8  MCH 31.5  --  32.0 31.6  MCHC 32.5  --  33.0 32.6  RDW 14.7  --  14.8 14.8  PLT 266  --  195 212    BNP Recent Labs  Lab 06/03/21 1639  BNP 296.3*     DDimer No results for input(s): DDIMER in the last 168 hours.   Radiology    DG Chest 1 View  Result Date: 06/05/2021 CLINICAL DATA:  Shortness of breath. EXAM: CHEST  1 VIEW COMPARISON:  06/03/2001. FINDINGS: Prior CABG. Heart size normal. Diffuse left lung infiltrate. Small bilateral pleural effusions, left side greater than right noted. No pneumothorax. IMPRESSION: 1. Diffuse left lung infiltrate. Small bilateral pleural effusions, left side greater than right. 2.  Prior CABG.  Heart size normal. Electronically Signed   By: Marcello Moores  Register   On: 06/05/2021 05:04   CARDIAC CATHETERIZATION  Result Date: 06/04/2021  Prox RCA lesion is 60% stenosed. Unchanged from prior cath. Heavily calcified. SVG to PLA/PDA was occluded.  RPDA  lesion is 90% stenosed.  Ost LM to Mid LM lesion is 40% stenosed. IVUS showed area of 4.83 mm2.  Ost LAD to Prox LAD lesion is 50% stenosed.  Prox LAD lesion is 100% stenosed. LIMA to LAD is patent  1st Diag lesion is 100% stenosed. SVG to diagonal is occluded. There are right to left collaterals.  2nd Mrg lesion is 100% stenosed. SVG to OM is occluded there are right to left collaterals.  Prox Cx to Mid Cx lesion is 80% stenosed. IVUS showed area of 3.13 mm2.  A drug-eluting stent was successfully placed using a SYNERGY XD 3.0X38, postdilated with a 3.75 [place] balloon, and optimized with intravascular ultrasound.  Post intervention, there is a 0% residual stenosis.  LV end diastolic pressure is mildly elevated.  There is no aortic valve stenosis.  Although the left main cross-sectional area was less than 6 mm, I elected not to intervene since it was greater than 4 mm and the LAD is occluded with a LIMA to LAD. Given his ACS, will switch his clopidogrel to Brilinta.  If there are issues with either cost or bleeding, could go back to clopidogrel 300 mg x 1 followed by 75 mg daily. Continue aggressive secondary prevention for LV dysfunction. Moderate RCA lesion would likely require atherectomy given heavy calcification if intervention was required.  It did appear unchanged compared to 2016.   DG Chest Portable 1 View  Result Date: 06/03/2021 CLINICAL DATA:  Chest pain.  COPD exacerbation. EXAM: PORTABLE CHEST 1 VIEW COMPARISON:  Most recent radiograph 03/02/2021. FINDINGS: Post median sternotomy and CABG. Normal heart size. Aortic atherosclerosis. Chronic hyperinflation and bronchial thickening. Blunting of the left greater than right costophrenic angle, similar to prior exam. Minimal ill-defined opacity at the right lung base. No pneumothorax. No pulmonary edema. No acute osseous abnormalities are seen. IMPRESSION: 1. Minimal ill-defined opacity at the right lung base, atelectasis versus pneumonia. 2.  Chronic hyperinflation and bronchial thickening, imaging findings consistent with COPD. 3. Blunting of the costophrenic angles appear similar, may be due to hyperinflation versus small effusions. Electronically Signed   By: Keith Rake M.D.   On: 06/03/2021 17:23   ECHOCARDIOGRAM COMPLETE  Result Date: 06/04/2021    ECHOCARDIOGRAM REPORT   Patient Name:   ELIH MOONEY Date of Exam: 06/04/2021 Medical Rec #:  [id number]      Height:       72.0 in Accession #:    [id number]  Weight:       128.0 lb Date of Birth:  [birth year]      BSA:          1.762 m Patient Age:    1 years       BP:           113/59 mmHg Patient Gender: M              HR:           87 bpm. Exam Location:  Inpatient Procedure: 2D Echo, Cardiac Doppler, Color Doppler and 3D Echo Indications:    NSTEMI I21.4  History:        Patient has prior history of Echocardiogram examinations, most                 recent 01/16/2015. Acute MI and CAD, COPD, Aortic Valve Disease;                 Risk Factors:Hypertension and Current Smoker.  Sonographer:    Darlina Sicilian RDCS Referring Phys: [id number] Freada Bergeron  Sonographer Comments: Technically difficult study due to poor echo windows. Image acquisition challenging due to COPD. Global longitudinal strain was attempted. IMPRESSIONS  1. Left ventricular ejection fraction, by estimation, is 35 to 40%. Left ventricular ejection fraction by 3D volume is 35 %. The left ventricle has moderately decreased function. The left ventricle demonstrates global hypokinesis. There is mild concentric left ventricular hypertrophy. Left ventricular diastolic parameters are consistent with Grade I diastolic dysfunction (impaired relaxation). Elevated left atrial pressure. There is severe hypokinesis of the left ventricular, mid-apical inferolateral wall. There is severe hypokinesis of the left ventricular, basal-mid anteroseptal wall and inferoseptal wall. Findings suggest occluded native LAD artery with patent bypass  to distal LAD and occluded arterial supply to OM/PLV territory.  2. Right ventricular systolic function is normal. The right ventricular size is normal. Tricuspid regurgitation signal is inadequate for assessing PA pressure.  3. Left atrial size was mildly dilated.  4. The mitral valve is normal in structure. Mild to moderate mitral valve regurgitation.  5. The aortic valve is tricuspid. There is moderate calcification of the aortic valve. There is moderate thickening of the aortic valve. Aortic valve regurgitation is mild to moderate. Mild to moderate aortic valve sclerosis/calcification is present, without any evidence of aortic stenosis.  6. Aortic dilatation noted. There is borderline dilatation of the aortic root, measuring 37 mm. Comparison(s): Prior images unable to be directly viewed, comparison made by report only. The left ventricular function is unchanged. The left ventricular inferolateral wall motion abnormality is new. FINDINGS  Left Ventricle: Left ventricular ejection fraction, by estimation, is 35 to 40%. Left ventricular ejection fraction by 3D volume is 35 %. The left ventricle has moderately decreased function. The left ventricle demonstrates global hypokinesis. Severe hypokinesis of the left ventricular, mid-apical inferolateral wall. Severe hypokinesis of the left ventricular, basal-mid anteroseptal wall and inferoseptal wall. Global longitudinal strain performed but not reported based on interpreter judgement due to  suboptimal tracking. The left ventricular internal cavity size was normal in size. There is mild concentric left ventricular hypertrophy. Abnormal (paradoxical) septal motion, consistent with left bundle branch block. Left ventricular diastolic parameters are consistent with Grade I diastolic dysfunction (impaired relaxation). Elevated left atrial pressure.  LV Wall Scoring: Findings suggest occluded native LAD artery with patent bypass to distal LAD and occluded arterial supply  to OM/PLV territory. Right Ventricle: The right ventricular size is normal. No increase in right ventricular  wall thickness. Right ventricular systolic function is normal. Tricuspid regurgitation signal is inadequate for assessing PA pressure. Left Atrium: Left atrial size was mildly dilated. Right Atrium: Right atrial size was normal in size. Pericardium: There is no evidence of pericardial effusion. Mitral Valve: The mitral valve is normal in structure. Mild to moderate mitral valve regurgitation, with centrally-directed jet. Tricuspid Valve: The tricuspid valve is normal in structure. Tricuspid valve regurgitation is not demonstrated. Aortic Valve: The aortic valve is tricuspid. There is moderate calcification of the aortic valve. There is moderate thickening of the aortic valve. Aortic valve regurgitation is mild to moderate. Aortic regurgitation PHT measures 489 msec. Mild to moderate aortic valve sclerosis/calcification is present, without any evidence of aortic stenosis. Pulmonic Valve: The pulmonic valve was grossly normal. Pulmonic valve regurgitation is not visualized. Aorta: Aortic dilatation noted. There is borderline dilatation of the aortic root, measuring 37 mm. IAS/Shunts: No atrial level shunt detected by color flow Doppler.  LEFT VENTRICLE PLAX 2D LVIDd:         5.00 cm         Diastology LVIDs:         4.00 cm         LV e' medial:    3.59 cm/s LV PW:         1.10 cm         LV E/e' medial:  21.1 LV IVS:        1.20 cm         LV e' lateral:   7.07 cm/s LVOT diam:     2.40 cm         LV E/e' lateral: 10.7 LV SV:         86 LV SV Index:   49 LVOT Area:     4.52 cm        3D Volume EF                                LV 3D EF:    Left                                             ventricular                                             ejection                                             fraction by                                             3D volume                                             is 35  %.  3D Volume EF:                                3D EF:        35 % RIGHT VENTRICLE RV S prime:     11.60 cm/s TAPSE (M-mode): 1.3 cm LEFT ATRIUM             Index       RIGHT ATRIUM          Index LA diam:        3.70 cm 2.10 cm/m  RA Area:     9.30 cm LA Vol (A2C):   46.0 ml 26.10 ml/m RA Volume:   20.60 ml 11.69 ml/m LA Vol (A4C):   43.1 ml 24.45 ml/m LA Biplane Vol: 45.5 ml 25.82 ml/m  AORTIC VALVE LVOT Vmax:   91.70 cm/s LVOT Vmean:  67.800 cm/s LVOT VTI:    0.190 m AI PHT:      489 msec  AORTA Ao Root diam: 3.70 cm MITRAL VALVE MV Area (PHT): 5.66 cm    SHUNTS MV Decel Time: 134 msec    Systemic VTI:  0.19 m MV E velocity: 75.80 cm/s  Systemic Diam: 2.40 cm MV A velocity: 92.50 cm/s MV E/A ratio:  0.82 Mihai Croitoru MD Electronically signed by Sanda Klein MD Signature Date/Time: 06/04/2021/9:43:26 AM    Final     Cardiac Studies   Cath: 06/04/21   Prox RCA lesion is 60% stenosed. Unchanged from prior cath. Heavily calcified. SVG to PLA/PDA was occluded.  RPDA lesion is 90% stenosed.  Ost LM to Mid LM lesion is 40% stenosed. IVUS showed area of 4.83 mm2.  Ost LAD to Prox LAD lesion is 50% stenosed.  Prox LAD lesion is 100% stenosed. LIMA to LAD is patent  1st Diag lesion is 100% stenosed. SVG to diagonal is occluded. There are right to left collaterals.  2nd Mrg lesion is 100% stenosed. SVG to OM is occluded there are right to left collaterals.  Prox Cx to Mid Cx lesion is 80% stenosed. IVUS showed area of 3.13 mm2.  A drug-eluting stent was successfully placed using a SYNERGY XD 3.0X38, postdilated with a 3.75 [place] balloon, and optimized with intravascular ultrasound.  Post intervention, there is a 0% residual stenosis.  LV end diastolic pressure is mildly elevated.  There is no aortic valve stenosis.   Although the left main cross-sectional area was less than 6 mm, I elected not to intervene since it was greater than 4 mm and the LAD is  occluded with a LIMA to LAD.  Given his ACS, will switch his clopidogrel to Brilinta.  If there are issues with either cost or bleeding, could go back to clopidogrel 300 mg x 1 followed by 75 mg daily.  Continue aggressive secondary prevention for LV dysfunction.  Moderate RCA lesion would likely require atherectomy given heavy calcification if intervention was required.  It did appear unchanged compared to 2016.  Diagnostic Dominance: Co-dominant    Intervention     Echo: 06/04/21  IMPRESSIONS    1. Left ventricular ejection fraction, by estimation, is 35 to 40%. Left  ventricular ejection fraction by 3D volume is 35 %. The left ventricle has  moderately decreased function. The left ventricle demonstrates global  hypokinesis. There is mild  concentric left ventricular hypertrophy. Left ventricular diastolic  parameters are consistent with Grade I diastolic dysfunction (impaired  relaxation). Elevated left  atrial pressure. There is severe hypokinesis of  the left ventricular, mid-apical  inferolateral wall. There is severe hypokinesis of the left ventricular,  basal-mid anteroseptal wall and inferoseptal wall. Findings suggest  occluded native LAD artery with patent bypass to distal LAD and occluded  arterial supply to OM/PLV territory.  2. Right ventricular systolic function is normal. The right ventricular  size is normal. Tricuspid regurgitation signal is inadequate for assessing  PA pressure.  3. Left atrial size was mildly dilated.  4. The mitral valve is normal in structure. Mild to moderate mitral valve  regurgitation.  5. The aortic valve is tricuspid. There is moderate calcification of the  aortic valve. There is moderate thickening of the aortic valve. Aortic  valve regurgitation is mild to moderate. Mild to moderate aortic valve  sclerosis/calcification is present,  without any evidence of aortic stenosis.  6. Aortic dilatation noted. There is  borderline dilatation of the aortic  root, measuring 37 mm.   Comparison(s): Prior images unable to be directly viewed, comparison made  by report only. The left ventricular function is unchanged. The left  ventricular inferolateral wall motion abnormality is new.   Patient Profile     70 y.o. male  with history of CAD s/p remote CABG in 2001, tobacco abuse, chronic systolic CHF with EF 12%, CKD, DM, and HLD. Presents with progressive SOB and cough. COPD exacerbation. Noted to have elevated troponin.  Assessment & Plan    1. Acute on Chronic respiratory failure 2/2 COPD exacerbation: treated with IV steroids, antibiotics. Had acute event this morning. CXR with left lunh infiltrate and bilateral small pleural effusions. Given nebs and IV lasix with improvement.  -- management per primary  2. NSTEMI: hsTn peaked at 3942. Underwent cardiac cath noted above with lesion in the p/mLCx treated with IVUS/PCI with DESx1. Occluded SVG-PLA/PDA with lesion in the RCA but felt this would likely need atherectomy in the future if intervention needed given heavy calcification (felt to be unchanged from cath in 2016). Placed on DAPT with ASA/Brilinta. With event noted this morning, suspect he will not be able to tolerate remaining on Brilinta.  -- will transition back to plavix and load with 300mg  x1.   3. HFrEF: Echo back in 2016 with EF of 35%, this admission 35-40% with global hypokinesis with LVH and G1DD. Severe hypokinesis of the left ventricular, mid-apical inferolateral wall, basal-mid anteroseptal wall and inferoseptal wall.  -- has been on Toprol XL PTA, but suspect he may need more cardio-selective BB given his severe COPD -- CR improving, would plan to start losartan +/- spiro prior to discharge. Consider Entresto as an outpatient but cost may be prohibitive   4. HLD: on statin, switched to Crestor 20mg  daily -- LDL 49  5. Tobacco use: continues to smoke.   6. DM: metformin PTA, held for  cath -- on SSI -- SLGT2 copay is high   For questions or updates, please contact [place] Please consult [email or web address] for contact info under        Signed, Reino Bellis, NP  06/05/2021, 7:57 AM

## 2021-06-05 NOTE — Progress Notes (Addendum)
PROGRESS NOTE   ROTH RESS  WPY:[id number] DOB: [birth year] DOA: 06/03/2021 PCP: Laurey Morale, MD  Brief Narrative:  70 year old male community dwelling CAD + CABG X5 2001-last cath 12/2014-recommended medical management HFrEF 35-40% Continued tobacco/COPD Prior CVA DM TY 2 HTN BPH  Presented to ED 06/04/2021 SOB, tachycardia 130s-code STEMI called and canceled-CXR =?  PNA Troponin [id number]--3942 Cardiology felt demand ischemia from pneumonia/COPD superimposed on underlying heart disease Cardiac cath 06/04/2021-DES placed mid circumflex--clopidogrel-->Brilinta  Hospital-Problem based course  Sepsis on admission secondary to pneumonia White count of 18, procalcitonin above 50 Empiric ceftriaxone doxycycline CXR 6/8 = left lung infiltrate Obtain speech eval not emergently NSTEMI status post DES Defer management to cardiology--antiplatelet agent and preference as per cardiology Acute superimposed on chronic systolic heart failure sinus tachycardia He is -900 cc yesterday He received several doses of Lasix overnight 6/7--hold for now Home dose of metoprolol is 50--changed to bisoprolol 2.5 per cardiology Moderate COPD not on home oxygen -- continue current smoker Long discussion with the patient-at his baseline 6 weeks ago he was fully functional and was taking care of himself Contemplative regarding smoking cessation-has a preference for the inhaler and declines use of AeroChamber--he has trialed Chantix in the past with good effect but worries about the cost-I will asked TOC to look into the situation about his meds and help him if we can Continue Mucinex 1200 twice daily DuoNeb 3 times daily Breo Ellipta Was recently on oral steroids in the outpatient setting Stop Solu-Medrol-start 40 mg prednisone over the next several days BPH We will BladderScan him to ensure he does not retaining urine continue Flomax 0.4 Prior CVA Plavix as above History of diabetes mellitus type 2? Sugars  are below 180-monitor trends only while on steroids Anxiety-mild confusion Patient sleeping poorly for the past several weeks Usually takes Xanax 3 milligrams nightly Give Xanax 1 mg now and 2 mg tonight-do not add any other meds on night shift given risk of decompensation and metabolic encephalopathy in a patient with recent NSTEMI and current pneumonia Severe Malnutrition-BMI 17 Supplement as able  DVT prophylaxis: Lovenox Code Status: Full Family Communication: None Disposition:  Status is: Inpatient  Remains inpatient appropriate because:Hemodynamically unstable, Ongoing diagnostic testing needed not appropriate for outpatient work up and Unsafe d/c plan   Dispo:  Patient From: Home  Planned Disposition: Home  Medically stable for discharge: No         Consultants:   Cardiology  Procedures:   Echocardiogram 6/7 IMPRESSIONS    1. Left ventricular ejection fraction, by estimation, is 35 to 40%. Left  ventricular ejection fraction by 3D volume is 35 %. The left ventricle has  moderately decreased function. The left ventricle demonstrates global  hypokinesis. There is mild  concentric left ventricular hypertrophy. Left ventricular diastolic  parameters are consistent with Grade I diastolic dysfunction (impaired  relaxation). Elevated left atrial pressure. There is severe hypokinesis of  the left ventricular, mid-apical  inferolateral wall. There is severe hypokinesis of the left ventricular,  basal-mid anteroseptal wall and inferoseptal wall. Findings suggest  occluded native LAD artery with patent bypass to distal LAD and occluded  arterial supply to OM/PLV territory.  2. Right ventricular systolic function is normal. The right ventricular  size is normal. Tricuspid regurgitation signal is inadequate for assessing  PA pressure.  3. Left atrial size was mildly dilated.  4. The mitral valve is normal in structure. Mild to moderate mitral valve  regurgitation.   5. The aortic valve is  tricuspid. There is moderate calcification of the  aortic valve. There is moderate thickening of the aortic valve. Aortic  valve regurgitation is mild to moderate. Mild to moderate aortic valve  sclerosis/calcification is present,  without any evidence of aortic stenosis.  6. Aortic dilatation noted. There is borderline dilatation of the aortic  root, measuring 37 mm.   Card Cath 06/04/21  Conclusion    Prox RCA lesion is 60% stenosed. Unchanged from prior cath. Heavily calcified. SVG to PLA/PDA was occluded.  RPDA lesion is 90% stenosed.  Ost LM to Mid LM lesion is 40% stenosed. IVUS showed area of 4.83 mm2.  Ost LAD to Prox LAD lesion is 50% stenosed.  Prox LAD lesion is 100% stenosed. LIMA to LAD is patent  1st Diag lesion is 100% stenosed. SVG to diagonal is occluded. There are right to left collaterals.  2nd Mrg lesion is 100% stenosed. SVG to OM is occluded there are right to left collaterals.  Prox Cx to Mid Cx lesion is 80% stenosed. IVUS showed area of 3.13 mm2.  A drug-eluting stent was successfully placed using a SYNERGY XD 3.0X38, postdilated with a 3.75 [place] balloon, and optimized with intravascular ultrasound.  Post intervention, there is a 0% residual stenosis.  LV end diastolic pressure is mildly elevated.  There is no aortic valve stenosis.   Although the left main cross-sectional area was less than 6 mm, I elected not to intervene since it was greater than 4 mm and the LAD is occluded with a LIMA to LAD.  Given his ACS, will switch his clopidogrel to Brilinta.  If there are issues with either cost or bleeding, could go back to clopidogrel 300 mg x 1 followed by 75 mg daily.  Continue aggressive secondary prevention for LV dysfunction.  Moderate RCA lesion would likely require atherectomy given heavy calcification if intervention was required.  It did appear unchanged compared to 2016.   Antimicrobials: Ceftriaxone  doxycycline   Subjective: Awake alert coherent no distress Feels fair Nursing concerned regarding patient slightly disoriented-seems to be forgetful to some degree of what had been discussed earlier during shift with nursing staff No current chest pain Still coughing-no wheeze-producing sputum-no nausea vomiting Feels very weak and ambulation to the restroom when symptoms significantly Note awake coherent no distress EOMI NCAT no focal deficit sinus tachycardia on monitors  Objective: Vitals:   06/05/21 0600 06/05/21 0810 06/05/21 0813 06/05/21 1153  BP: 115/86 127/74  122/73  Pulse: (!) 120 (!) 105  (!) 117  Resp: (!) 22 18  20   Temp: 98 F (36.7 C) 98.3 F (36.8 C)  98.1 F (36.7 C)  TempSrc: Oral Oral  Oral  SpO2: 93% 95% 96% 96%  Weight:        Intake/Output Summary (Last 24 hours) at 06/05/2021 1423 Last data filed at 06/05/2021 1223 Gross per 24 hour  Intake --  Output 875 ml  Net -875 ml   Filed Weights   06/03/21 1616  Weight: 58.1 kg    Examination:  Frail somewhat cachectic throat clear S1-S2 sinus tach on monitor Chest clear no rales rhonchi Abdomen soft No lower extremity edema my exam Neurologically intact no focal deficit Psych euthymic  Data Reviewed: personally reviewed   CBC    Component Value Date/Time   WBC 18.9 (H) 06/05/2021 0301   RBC 3.42 (L) 06/05/2021 0301   HGB 10.8 (L) 06/05/2021 0301   HCT 33.1 (L) 06/05/2021 0301   PLT 212 06/05/2021 0301  MCV 96.8 06/05/2021 0301   MCH 31.6 06/05/2021 0301   MCHC 32.6 06/05/2021 0301   RDW 14.8 06/05/2021 0301   LYMPHSABS 0.6 (L) 06/03/2021 1613   MONOABS 1.8 (H) 06/03/2021 1613   EOSABS 0.1 06/03/2021 1613   BASOSABS 0.1 06/03/2021 1613   CMP Latest Ref Rng & Units 06/05/2021 06/04/2021 06/03/2021  Glucose 70 - 99 mg/dL 153(H) 235(H) -  BUN 8 - 23 mg/dL 47(H) 32(H) -  Creatinine 0.61 - 1.24 mg/dL 1.25(H) 1.45(H) -  Sodium 135 - 145 mmol/L 138 137 139  Potassium 3.5 - 5.1 mmol/L 4.1 4.4 4.2   Chloride 98 - 111 mmol/L 104 104 -  CO2 22 - 32 mmol/L 25 23 -  Calcium 8.9 - 10.3 mg/dL 8.6(L) 8.5(L) -  Total Protein 6.5 - 8.1 g/dL - - -  Total Bilirubin 0.3 - 1.2 mg/dL - - -  Alkaline Phos 38 - 126 U/L - - -  AST 15 - 41 U/L - - -  ALT 0 - 44 U/L - - -     Radiology Studies: DG Chest 1 View  Result Date: 06/05/2021 CLINICAL DATA:  Shortness of breath. EXAM: CHEST  1 VIEW COMPARISON:  06/03/2001. FINDINGS: Prior CABG. Heart size normal. Diffuse left lung infiltrate. Small bilateral pleural effusions, left side greater than right noted. No pneumothorax. IMPRESSION: 1. Diffuse left lung infiltrate. Small bilateral pleural effusions, left side greater than right. 2.  Prior CABG.  Heart size normal. Electronically Signed   By: Marcello Moores  Register   On: 06/05/2021 05:04   CARDIAC CATHETERIZATION  Result Date: 06/04/2021  Prox RCA lesion is 60% stenosed. Unchanged from prior cath. Heavily calcified. SVG to PLA/PDA was occluded.  RPDA lesion is 90% stenosed.  Ost LM to Mid LM lesion is 40% stenosed. IVUS showed area of 4.83 mm2.  Ost LAD to Prox LAD lesion is 50% stenosed.  Prox LAD lesion is 100% stenosed. LIMA to LAD is patent  1st Diag lesion is 100% stenosed. SVG to diagonal is occluded. There are right to left collaterals.  2nd Mrg lesion is 100% stenosed. SVG to OM is occluded there are right to left collaterals.  Prox Cx to Mid Cx lesion is 80% stenosed. IVUS showed area of 3.13 mm2.  A drug-eluting stent was successfully placed using a SYNERGY XD 3.0X38, postdilated with a 3.75 [place] balloon, and optimized with intravascular ultrasound.  Post intervention, there is a 0% residual stenosis.  LV end diastolic pressure is mildly elevated.  There is no aortic valve stenosis.  Although the left main cross-sectional area was less than 6 mm, I elected not to intervene since it was greater than 4 mm and the LAD is occluded with a LIMA to LAD. Given his ACS, will switch his clopidogrel to  Brilinta.  If there are issues with either cost or bleeding, could go back to clopidogrel 300 mg x 1 followed by 75 mg daily. Continue aggressive secondary prevention for LV dysfunction. Moderate RCA lesion would likely require atherectomy given heavy calcification if intervention was required.  It did appear unchanged compared to 2016.   DG Chest Portable 1 View  Result Date: 06/03/2021 CLINICAL DATA:  Chest pain.  COPD exacerbation. EXAM: PORTABLE CHEST 1 VIEW COMPARISON:  Most recent radiograph 03/02/2021. FINDINGS: Post median sternotomy and CABG. Normal heart size. Aortic atherosclerosis. Chronic hyperinflation and bronchial thickening. Blunting of the left greater than right costophrenic angle, similar to prior exam. Minimal ill-defined opacity at the right lung base. No  pneumothorax. No pulmonary edema. No acute osseous abnormalities are seen. IMPRESSION: 1. Minimal ill-defined opacity at the right lung base, atelectasis versus pneumonia. 2. Chronic hyperinflation and bronchial thickening, imaging findings consistent with COPD. 3. Blunting of the costophrenic angles appear similar, may be due to hyperinflation versus small effusions. Electronically Signed   By: Keith Rake M.D.   On: 06/03/2021 17:23   ECHOCARDIOGRAM COMPLETE  Result Date: 06/04/2021    ECHOCARDIOGRAM REPORT   Patient Name:   Wayne Williams Date of Exam: 06/04/2021 Medical Rec #:  [id number]      Height:       72.0 in Accession #:    [id number]     Weight:       128.0 lb Date of Birth:  [birth year]      BSA:          1.762 m Patient Age:    65 years       BP:           113/59 mmHg Patient Gender: M              HR:           87 bpm. Exam Location:  Inpatient Procedure: 2D Echo, Cardiac Doppler, Color Doppler and 3D Echo Indications:    NSTEMI I21.4  History:        Patient has prior history of Echocardiogram examinations, most                 recent 01/16/2015. Acute MI and CAD, COPD, Aortic Valve Disease;                 Risk  Factors:Hypertension and Current Smoker.  Sonographer:    Darlina Sicilian RDCS Referring Phys: [id number] Freada Bergeron  Sonographer Comments: Technically difficult study due to poor echo windows. Image acquisition challenging due to COPD. Global longitudinal strain was attempted. IMPRESSIONS  1. Left ventricular ejection fraction, by estimation, is 35 to 40%. Left ventricular ejection fraction by 3D volume is 35 %. The left ventricle has moderately decreased function. The left ventricle demonstrates global hypokinesis. There is mild concentric left ventricular hypertrophy. Left ventricular diastolic parameters are consistent with Grade I diastolic dysfunction (impaired relaxation). Elevated left atrial pressure. There is severe hypokinesis of the left ventricular, mid-apical inferolateral wall. There is severe hypokinesis of the left ventricular, basal-mid anteroseptal wall and inferoseptal wall. Findings suggest occluded native LAD artery with patent bypass to distal LAD and occluded arterial supply to OM/PLV territory.  2. Right ventricular systolic function is normal. The right ventricular size is normal. Tricuspid regurgitation signal is inadequate for assessing PA pressure.  3. Left atrial size was mildly dilated.  4. The mitral valve is normal in structure. Mild to moderate mitral valve regurgitation.  5. The aortic valve is tricuspid. There is moderate calcification of the aortic valve. There is moderate thickening of the aortic valve. Aortic valve regurgitation is mild to moderate. Mild to moderate aortic valve sclerosis/calcification is present, without any evidence of aortic stenosis.  6. Aortic dilatation noted. There is borderline dilatation of the aortic root, measuring 37 mm. Comparison(s): Prior images unable to be directly viewed, comparison made by report only. The left ventricular function is unchanged. The left ventricular inferolateral wall motion abnormality is new. FINDINGS  Left Ventricle:  Left ventricular ejection fraction, by estimation, is 35 to 40%. Left ventricular ejection fraction by 3D volume is 35 %. The left ventricle has moderately decreased function. The left ventricle demonstrates  global hypokinesis. Severe hypokinesis of the left ventricular, mid-apical inferolateral wall. Severe hypokinesis of the left ventricular, basal-mid anteroseptal wall and inferoseptal wall. Global longitudinal strain performed but not reported based on interpreter judgement due to  suboptimal tracking. The left ventricular internal cavity size was normal in size. There is mild concentric left ventricular hypertrophy. Abnormal (paradoxical) septal motion, consistent with left bundle branch block. Left ventricular diastolic parameters are consistent with Grade I diastolic dysfunction (impaired relaxation). Elevated left atrial pressure.  LV Wall Scoring: Findings suggest occluded native LAD artery with patent bypass to distal LAD and occluded arterial supply to OM/PLV territory. Right Ventricle: The right ventricular size is normal. No increase in right ventricular wall thickness. Right ventricular systolic function is normal. Tricuspid regurgitation signal is inadequate for assessing PA pressure. Left Atrium: Left atrial size was mildly dilated. Right Atrium: Right atrial size was normal in size. Pericardium: There is no evidence of pericardial effusion. Mitral Valve: The mitral valve is normal in structure. Mild to moderate mitral valve regurgitation, with centrally-directed jet. Tricuspid Valve: The tricuspid valve is normal in structure. Tricuspid valve regurgitation is not demonstrated. Aortic Valve: The aortic valve is tricuspid. There is moderate calcification of the aortic valve. There is moderate thickening of the aortic valve. Aortic valve regurgitation is mild to moderate. Aortic regurgitation PHT measures 489 msec. Mild to moderate aortic valve sclerosis/calcification is present, without any evidence  of aortic stenosis. Pulmonic Valve: The pulmonic valve was grossly normal. Pulmonic valve regurgitation is not visualized. Aorta: Aortic dilatation noted. There is borderline dilatation of the aortic root, measuring 37 mm. IAS/Shunts: No atrial level shunt detected by color flow Doppler.  LEFT VENTRICLE PLAX 2D LVIDd:         5.00 cm         Diastology LVIDs:         4.00 cm         LV e' medial:    3.59 cm/s LV PW:         1.10 cm         LV E/e' medial:  21.1 LV IVS:        1.20 cm         LV e' lateral:   7.07 cm/s LVOT diam:     2.40 cm         LV E/e' lateral: 10.7 LV SV:         86 LV SV Index:   49 LVOT Area:     4.52 cm        3D Volume EF                                LV 3D EF:    Left                                             ventricular                                             ejection  fraction by                                             3D volume                                             is 35 %.                                 3D Volume EF:                                3D EF:        35 % RIGHT VENTRICLE RV S prime:     11.60 cm/s TAPSE (M-mode): 1.3 cm LEFT ATRIUM             Index       RIGHT ATRIUM          Index LA diam:        3.70 cm 2.10 cm/m  RA Area:     9.30 cm LA Vol (A2C):   46.0 ml 26.10 ml/m RA Volume:   20.60 ml 11.69 ml/m LA Vol (A4C):   43.1 ml 24.45 ml/m LA Biplane Vol: 45.5 ml 25.82 ml/m  AORTIC VALVE LVOT Vmax:   91.70 cm/s LVOT Vmean:  67.800 cm/s LVOT VTI:    0.190 m AI PHT:      489 msec  AORTA Ao Root diam: 3.70 cm MITRAL VALVE MV Area (PHT): 5.66 cm    SHUNTS MV Decel Time: 134 msec    Systemic VTI:  0.19 m MV E velocity: 75.80 cm/s  Systemic Diam: 2.40 cm MV A velocity: 92.50 cm/s MV E/A ratio:  0.82 Mihai Croitoru MD Electronically signed by Sanda Klein MD Signature Date/Time: 06/04/2021/9:43:26 AM    Final      Scheduled Meds: . AeroChamber Plus Flo-Vu Medium  1 each Other Once  . ALPRAZolam  1 mg Oral  Once  . [START ON 06/06/2021] ALPRAZolam  2 mg Oral QHS  . aspirin  81 mg Oral Daily  . [START ON 06/06/2021] clopidogrel  75 mg Oral Daily  . doxycycline  100 mg Oral Q12H  . enoxaparin (LOVENOX) injection  40 mg Subcutaneous Daily  . fluticasone furoate-vilanterol  1 puff Inhalation QHS  . guaiFENesin  1,200 mg Oral BID  . insulin aspart  0-9 Units Subcutaneous Q4H  . ipratropium-albuterol  3 mL Nebulization TID  . metoprolol succinate  12.5 mg Oral Daily  . mometasone-formoterol  2 puff Inhalation BID  . pantoprazole  40 mg Oral Daily  . predniSONE  40 mg Oral QAC breakfast  . rosuvastatin  20 mg Oral Daily  . sodium chloride flush  3 mL Intravenous Q12H  . sodium chloride flush  3 mL Intravenous Q12H  . tamsulosin  0.4 mg Oral Daily  . ticagrelor  90 mg Oral BID   Continuous Infusions: . sodium chloride    . cefTRIAXone (ROCEPHIN)  IV 2 g (06/04/21 2035)     LOS: 2 days   Time spent: [place], MD [facility] To contact the attending  provider between 7A-7P or the covering provider during after hours 7P-7A, please log into the web site [email or web address] and access using universal [place] password for that web site. If you do not have the password, please call the hospital operator.  06/05/2021, 2:23 PM

## 2021-06-05 NOTE — Hospital Course (Addendum)
70 year old male community dwelling CAD + CABG X5 2001-last cath 12/2014-recommended medical management HFrEF 35-40% Continued tobacco/COPD Prior CVA DM TY 2 HTN BPH  Presented to ED 06/04/2021 SOB, tachycardia 130s-code STEMI called and canceled-CXR =?  PNA Troponin 2767--3942 Cardiology felt demand ischemia from pneumonia/COPD superimposed on underlying heart disease Cardiac cath 06/04/2021-DES placed mid circumflex--clopidogrel-->Brilinta    BUNs/creatinine 32/1.4-->47/1.2 Procalcitonin 51 White count 17.4-->18.9 Hemoglobin 13.7-->10.8 Platelet 266-->212  Tachycardic 120s on nasal cannula currently

## 2021-06-05 NOTE — Progress Notes (Signed)
Initial Nutrition Assessment  DOCUMENTATION CODES:   Severe malnutrition in context of chronic illness,Underweight  INTERVENTION:    Ensure Enlive po TID, each supplement provides 350 kcal and 20 grams of protein  MVI with minerals daily  NUTRITION DIAGNOSIS:   Severe Malnutrition related to chronic illness (COPD, CAD) as evidenced by severe fat depletion,severe muscle depletion,percent weight loss (15% weight loss x 3 months).  GOAL:   Patient will meet greater than or equal to 90% of their needs  MONITOR:   PO intake,Supplement acceptance,Labs  REASON FOR ASSESSMENT:   Malnutrition Screening Tool    ASSESSMENT:   70 yo male admitted with SOB, productive cough, NSTEMI. PMH includes CAD S/P CABG, COPD, ongoing tobacco use, HTN, CVA, DM-2, BPH, depression/anxiety.  Patient reports poor appetite and poor intake at home and since admission. He has lost a lot of weight. He agreed to try Ensure supplements between meals.   Weight history reviewed. Patient with recent 15% weight loss within the past 3 months, which is severe.   Labs reviewed.  CBG: [phone]  Medications reviewed and include Novolog, prednisone, Flomax.  Meets criteria for severe malnutrition with severe depletion of muscle and subcutaneous fat mass with severe weight loss.   NUTRITION - FOCUSED PHYSICAL EXAM:  Flowsheet Row Most Recent Value  Orbital Region Severe depletion  Upper Arm Region Severe depletion  Thoracic and Lumbar Region Severe depletion  Buccal Region Severe depletion  Temple Region Severe depletion  Clavicle Bone Region Severe depletion  Clavicle and Acromion Bone Region Severe depletion  Scapular Bone Region Severe depletion  Dorsal Hand Severe depletion  Patellar Region Severe depletion  Anterior Thigh Region Severe depletion  Posterior Calf Region Severe depletion  Edema (RD Assessment) None  Hair Reviewed  Eyes Reviewed  Mouth Reviewed  Skin Reviewed  Nails  Reviewed       Diet Order:   Diet Order            Diet Carb Modified Fluid consistency: Thin; Room service appropriate? Yes  Diet effective now                 EDUCATION NEEDS:   Education needs have been addressed  Skin:  Skin Assessment: Reviewed RN Assessment  Last BM:  no BM documented  Height:   Ht Readings from Last 1 Encounters:  06/05/21 6' (1.829 m)    Weight:   Wt Readings from Last 1 Encounters:  06/03/21 58.1 kg    Ideal Body Weight:  80.9 kg  BMI:  Body mass index is 17.36 kg/m.  Estimated Nutritional Needs:   Kcal:  2000-2200  Protein:  90-110 gm  Fluid:  1.8-2 L    Lucas Mallow, RD, LDN, CNSC Please refer to Amion for contact information.

## 2021-06-05 NOTE — Progress Notes (Signed)
   06/05/21 0405  Assess: MEWS Score  Temp 97.8 F (36.6 C)  BP 134/67  Pulse Rate 88  ECG Heart Rate (!) 124  Resp (!) 24  Level of Consciousness Alert  SpO2 95 %  O2 Device Room Air  Patient Activity (if Appropriate) In bed  Assess: MEWS Score  MEWS Temp 0  MEWS Systolic 0  MEWS Pulse 2  MEWS RR 1  MEWS LOC 0  MEWS Score 3  MEWS Score Color Yellow  Assess: if the MEWS score is Yellow or Red  Were vital signs taken at a resting state? Yes  Focused Assessment No change from prior assessment  Early Detection of Sepsis Score *See Row Information* Medium  MEWS guidelines implemented *See Row Information* Yes  Treat  MEWS Interventions Administered prn meds/treatments;Escalated (See documentation below)  Pain Scale 0-10  Pain Score 0  Take Vital Signs  Increase Vital Sign Frequency  Yellow: Q 2hr X 2 then Q 4hr X 2, if remains yellow, continue Q 4hrs  Escalate  MEWS: Escalate Yellow: discuss with charge nurse/RN and consider discussing with provider and RRT  Notify: Charge Nurse/RN  Name of Charge Nurse/RN Notified [place]  Date Charge Nurse/RN Notified 06/05/21  Time Charge Nurse/RN Notified 0405  Notify: Provider  Provider Name/Title Dr. Marlyce Huge  Date Provider Notified 06/05/21  Time Provider Notified [phone]  Notification Type Call  Notification Reason Change in status  Provider response See new orders  Date of Provider Response 06/05/21  Time of Provider Response 0420  Document  Patient Outcome Stabilized after interventions

## 2021-06-05 NOTE — Progress Notes (Signed)
CARDIAC REHAB PHASE I   PRE:  Rate/Rhythm: 122-126ST  BP:  Supine: 127/74  Sitting:   Standing:    SaO2: was on breathing treatment     92-93% 2L  MODE:  Ambulation: 0 ft  0820-0900 Was coming to walk with pt but too SOB. Getting breathing treatment. Pt switched to 2L after tx. Pt sitting on side of bed with labored breathing. Pt finished his pills. Gave warm blanket and offered comfort measures. Pt not in condition for ambulation or education at this time. Left materials at bedside. Will continue to follow. HR to 126 ST while in room and sats at 92-93% on 2L.       Graylon Good, RN BSN  06/05/2021 9:03 AM  \\

## 2021-06-06 LAB — COMPREHENSIVE METABOLIC PANEL
ALT: 35 U/L (ref 0–44)
AST: 51 U/L — ABNORMAL HIGH (ref 15–41)
Albumin: 2.9 g/dL — ABNORMAL LOW (ref 3.5–5.0)
Alkaline Phosphatase: 42 U/L (ref 38–126)
Anion gap: 10 (ref 5–15)
BUN: 38 mg/dL — ABNORMAL HIGH (ref 8–23)
CO2: 27 mmol/L (ref 22–32)
Calcium: 8.7 mg/dL — ABNORMAL LOW (ref 8.9–10.3)
Chloride: 103 mmol/L (ref 98–111)
Creatinine, Ser: 1.4 mg/dL — ABNORMAL HIGH (ref 0.61–1.24)
GFR, Estimated: 53 mL/min — ABNORMAL LOW (ref >60)
Glucose, Bld: 64 mg/dL — ABNORMAL LOW (ref 70–99)
Potassium: 4.2 mmol/L (ref 3.5–5.1)
Sodium: 140 mmol/L (ref 135–145)
Total Bilirubin: 0.5 mg/dL (ref 0.3–1.2)
Total Protein: 5.7 g/dL — ABNORMAL LOW (ref 6.5–8.1)

## 2021-06-06 LAB — GLUCOSE, CAPILLARY
Glucose-Capillary: 104 mg/dL — ABNORMAL HIGH (ref 70–99)
Glucose-Capillary: 193 mg/dL — ABNORMAL HIGH (ref 70–99)
Glucose-Capillary: 200 mg/dL — ABNORMAL HIGH (ref 70–99)
Glucose-Capillary: 212 mg/dL — ABNORMAL HIGH (ref 70–99)
Glucose-Capillary: 292 mg/dL — ABNORMAL HIGH (ref 70–99)

## 2021-06-06 LAB — CBC
HCT: 31.7 % — ABNORMAL LOW (ref 39.0–52.0)
Hemoglobin: 10.3 g/dL — ABNORMAL LOW (ref 13.0–17.0)
MCH: 31.4 pg (ref 26.0–34.0)
MCHC: 32.5 g/dL (ref 30.0–36.0)
MCV: 96.6 fL (ref 80.0–100.0)
Platelets: 213 10*3/uL (ref 150–400)
RBC: 3.28 MIL/uL — ABNORMAL LOW (ref 4.22–5.81)
RDW: 14.8 % (ref 11.5–15.5)
WBC: 13.8 10*3/uL — ABNORMAL HIGH (ref 4.0–10.5)
nRBC: 0 % (ref 0.0–0.2)

## 2021-06-06 LAB — PROCALCITONIN: Procalcitonin: 29.07 ng/mL

## 2021-06-06 LAB — MAGNESIUM: Magnesium: 2.1 mg/dL (ref 1.7–2.4)

## 2021-06-06 MED ORDER — PREDNISONE 10 MG PO TABS: 10.0000 mg | ORAL_TABLET | Freq: Every day | ORAL | Status: DC

## 2021-06-06 MED ORDER — MOMETASONE FURO-FORMOTEROL FUM 200-5 MCG/ACT IN AERO
2.0000 | INHALATION_SPRAY | Freq: Two times a day (BID) | RESPIRATORY_TRACT | 0 refills | Status: DC
Start: 2021-06-06 — End: 2021-06-12

## 2021-06-06 MED ORDER — AMOXICILLIN-POT CLAVULANATE 875-125 MG PO TABS: 1.0000 | ORAL_TABLET | Freq: Two times a day (BID) | ORAL | Status: DC

## 2021-06-06 MED ORDER — ALPRAZOLAM 0.5 MG PO TABS
0.5000 mg | ORAL_TABLET | Freq: Once | ORAL | Status: AC
  Administered 2021-06-06: 0.5 mg via ORAL
  Filled 2021-06-06: qty 1

## 2021-06-06 MED ORDER — ROSUVASTATIN CALCIUM 20 MG PO TABS: 20.0000 mg | ORAL_TABLET | Freq: Every day | ORAL | 3 refills | Status: DC

## 2021-06-06 MED ORDER — VARENICLINE TARTRATE 0.5 MG X 11 & 1 MG X 42 PO MISC: ORAL | 0 refills | Status: DC

## 2021-06-06 MED ORDER — BISOPROLOL FUMARATE 5 MG PO TABS: 2.5000 mg | ORAL_TABLET | Freq: Every day | ORAL | 3 refills | Status: DC

## 2021-06-06 MED ORDER — AMOXICILLIN-POT CLAVULANATE 875-125 MG PO TABS: 1.0000 | ORAL_TABLET | Freq: Two times a day (BID) | ORAL | 0 refills | Status: DC

## 2021-06-06 MED ORDER — GUAIFENESIN ER 600 MG PO TB12: 1200.0000 mg | ORAL_TABLET | Freq: Two times a day (BID) | ORAL | 0 refills | Status: DC

## 2021-06-06 NOTE — Evaluation (Signed)
Clinical/Bedside Swallow Evaluation Patient Details  Name: Wayne Williams MRN: [id number] Date of Birth: 23-Nov-1951  Today's Date: 06/06/2021 Time: SLP Start Time (ACUTE ONLY): 1420 SLP Stop Time (ACUTE ONLY): 1446 SLP Time Calculation (min) (ACUTE ONLY): 26 min  Past Medical History:  Past Medical History:  Diagnosis Date   Anxiety    CAD (coronary artery disease)    s/p cabg x 4   Cerebrovascular accident ([place])    hx of 06 20 09   Depression    saw Dr. Casimiro Williams in the past   Diabetes mellitus    ED (erectile dysfunction)    GERD (gastroesophageal reflux disease)    Hypertension    Insomnia    Past Surgical History:  Past Surgical History:  Procedure Laterality Date   CERVICAL LAMINECTOMY  1996   per Dr. Hal Neer   CORONARY STENT INTERVENTION N/A 06/04/2021   Procedure: CORONARY STENT INTERVENTION;  Surgeon: Jettie Booze, MD;  Location: [facility];  Service: Cardiovascular;  Laterality: N/A;   Heart bypass  2001   INTRAVASCULAR ULTRASOUND/IVUS N/A 06/04/2021   Procedure: Intravascular Ultrasound/IVUS;  Surgeon: Jettie Booze, MD;  Location: [facility];  Service: Cardiovascular;  Laterality: N/A;   LEFT HEART CATH AND CORS/GRAFTS ANGIOGRAPHY N/A 06/04/2021   Procedure: LEFT HEART CATH AND CORS/GRAFTS ANGIOGRAPHY;  Surgeon: Jettie Booze, MD;  Location: [facility];  Service: Cardiovascular;  Laterality: N/A;   LEFT HEART CATHETERIZATION WITH CORONARY/GRAFT ANGIOGRAM N/A 01/24/2015   Procedure: LEFT HEART CATHETERIZATION WITH Beatrix Fetters;  Surgeon: Burnell Blanks, MD;  Location: [facility];  Service: Cardiovascular;  Laterality: N/A;   [place]   per Dr. Gladstone Lighter   HPI:  Presented to ED 06/04/2021 SOB, tachycardia 130s-code STEMI called and canceled-CXR =?  PNA  Troponin 2767--3942. Cardiology felt demand ischemia from pneumonia/COPD superimposed on underlying heart disease  Cardiac cath 06/04/2021. Pt has  suffered from [place], and has struggled with secretion management.   Assessment / Plan / Recommendation Clinical Impression  Pt demonstrates no immediate coughing or inidication of aspiration or dyspahgis with his lunch meal. He does have frequent baseline coughing. Aspiration cannot be ruled out without instrumental assessment, but pt and daughter both feel fairly confident that coughing is not related to any difficulty swallowing. They do not want to pursue MBS at this time, which is reasonable. Pt to continue a regular diet and thin liquids. No SLP f/u needed. SLP Visit Diagnosis: Dysphagia, unspecified (R13.10)    Aspiration Risk  Mild aspiration risk    Diet Recommendation Regular;Thin liquid   Liquid Administration via: Cup;Straw Medication Administration: Whole meds with liquid Supervision: Patient able to self feed Postural Changes: Seated upright at 90 degrees    Other  Recommendations Oral Care Recommendations: Patient independent with oral care   Follow up Recommendations None      Frequency and Duration            Prognosis        Swallow Study   General HPI: Presented to ED 06/04/2021 SOB, tachycardia 130s-code STEMI called and canceled-CXR =?  PNA  Troponin 2767--3942. Cardiology felt demand ischemia from pneumonia/COPD superimposed on underlying heart disease  Cardiac cath 06/04/2021. Pt has suffered from [place], and has struggled with secretion management. Type of Study: Bedside Swallow Evaluation Previous Swallow Assessment: none Diet Prior to this Study: Regular;Thin liquids Temperature Spikes Noted: No Respiratory Status: Room air History of Recent Intubation: No Behavior/Cognition: Alert;Cooperative;Pleasant mood  Oral Cavity Assessment: Within Functional Limits Oral Care Completed by SLP: No Oral Cavity - Dentition: Missing dentition Vision: Functional for self-feeding Self-Feeding Abilities: Able to feed self Patient Positioning: Upright in chair Baseline  Vocal Quality: Normal Volitional Cough: Congested;Strong Volitional Swallow: Able to elicit    Oral/Motor/Sensory Function Overall Oral Motor/Sensory Function: Within functional limits   Ice Chips     Thin Liquid Thin Liquid: Within functional limits    Nectar Thick Nectar Thick Liquid: Not tested   Honey Thick Honey Thick Liquid: Not tested   Puree Puree: Within functional limits   Solid     Solid: Within functional limits     Herbie Baltimore, MA [place] Pager [phone] Office [phone]  Othelia Pulling, Katherene Ponto 06/06/2021,2:59 PM

## 2021-06-06 NOTE — Evaluation (Signed)
Physical Therapy Evaluation Patient Details Name: Wayne Williams MRN: [id number] DOB: 1951/10/25 Today's Date: 06/06/2021   History of Present Illness  Pt is a 70 y.o. male admitted 06/03/21 with SOB; workup for NSTEMI, COPD exacerbation, RLL PNA. S/p cardiac cath and stent on 6/7. PMH includes CAD (s/p CABG 2001), HF, COPD, CVA, DM2, HTN, anxiety, tobacco use.  Clinical Impression  Pt demonstrates generalized weakness and deficits in endurance, power, activity tolerance, balance, and safety awareness limiting safety and independence with mobility. Pt tolerates gait for household distances with physical assistance, demonstrating mild instability and experiences 1 LOB also requiring physical assistance to correct. Pt ambulates for additional bout with RW demonstrating improved stability, no losses of balance. Pt will benefit from acute PT services for additional gait training with RW to improve safety and independence in mobility. SPT recommends OOPT to address deficits in balance and decrease future risk of falls.     Follow Up Recommendations Outpatient PT    Equipment Recommendations  Rolling walker with 5" wheels    Recommendations for Other Services       Precautions / Restrictions Precautions Precautions: Fall Restrictions Weight Bearing Restrictions: No      Mobility  Bed Mobility Overal bed mobility: Modified Independent             General bed mobility comments: HOB elevated, increased time    Transfers Overall transfer level: Needs assistance Equipment used: None Transfers: Sit to/from Stand (x 2 reps) Sit to Stand: Min guard         General transfer comment: min G for safety  Ambulation/Gait Ambulation/Gait assistance: Min guard;Min assist Gait Distance (Feet): 60 Feet (60 feet followed by brief standing break, 40 feet to bathroom, 20 feet wiht HHA. Pt ambulates for additional 100 feet with RW) Assistive device: Rolling walker (2 wheeled);1 person hand held  assist Gait Pattern/deviations: Step-through pattern;Decreased stride length;Trunk flexed Gait velocity: decreased Gait velocity interpretation: 1.31 - 2.62 ft/sec, indicative of limited community ambulator General Gait Details: Pt performs gait with HHA and reaching for railing in hallway. Pt experiences 1 LOB and requires min A to correct. Pt ambulates for additional 100 feet with RW with improved stability and without physical assistance.  Stairs            Wheelchair Mobility    Modified Rankin (Stroke Patients Only)       Balance Overall balance assessment: Needs assistance Sitting-balance support: Feet supported Sitting balance-Leahy Scale: Fair     Standing balance support: Single extremity supported;During functional activity;Bilateral upper extremity supported Standing balance-Leahy Scale: Poor Standing balance comment: pt reliant upon at least SUE and external support                             Pertinent Vitals/Pain Pain Assessment: No/denies pain Faces Pain Scale: Hurts a little bit Pain Location: upper thigh from cath site Pain Descriptors / Indicators: Sore Pain Intervention(s): Monitored during session    Home Living Family/patient expects to be discharged to:: Private residence Living Arrangements: Alone Available Help at Discharge: Family Type of Home: Other(Comment) (condo) Home Access: Level entry     Home Layout: Two level;Able to live on main level with bedroom/bathroom Home Equipment: Cane - single point Additional Comments: hasnt been using upstairs for about a year, bird baths for bathing    Prior Function Level of Independence: Independent         Comments: patient reports sleeping in recliner  downstairs and sponge bathing vs going upstairs to tub/shower. also uses cane as needed     Hand Dominance   Dominant Hand: Right    Extremity/Trunk Assessment   Upper Extremity Assessment Upper Extremity Assessment: Defer to  OT evaluation    Lower Extremity Assessment Lower Extremity Assessment: Generalized weakness    Cervical / Trunk Assessment Cervical / Trunk Assessment: Normal  Communication   Communication: No difficulties  Cognition Arousal/Alertness: Awake/alert Behavior During Therapy: WFL for tasks assessed/performed Overall Cognitive Status: Within Functional Limits for tasks assessed                                 General Comments: Pt tangential at times.      General Comments General comments (skin integrity, edema, etc.): VSS on RA.    Exercises     Assessment/Plan    PT Assessment Patient needs continued PT services  PT Problem List Decreased strength;Decreased activity tolerance;Decreased balance;Decreased mobility;Decreased knowledge of use of DME;Decreased safety awareness;Decreased knowledge of precautions       PT Treatment Interventions DME instruction;Gait training;Functional mobility training;Therapeutic activities;Therapeutic exercise;Balance training;Patient/family education    PT Goals (Current goals can be found in the Care Plan section)  Acute Rehab PT Goals Patient Stated Goal: get stronger and go home PT Goal Formulation: With patient/family Time For Goal Achievement: 06/20/21 Potential to Achieve Goals: Good    Frequency Min 3X/week   Barriers to discharge        Co-evaluation               AM-PAC PT "6 Clicks" Mobility  Outcome Measure Help needed turning from your back to your side while in a flat bed without using bedrails?: None Help needed moving from lying on your back to sitting on the side of a flat bed without using bedrails?: None Help needed moving to and from a bed to a chair (including a wheelchair)?: A Little Help needed standing up from a chair using your arms (e.g., wheelchair or bedside chair)?: A Little Help needed to walk in hospital room?: A Little Help needed climbing 3-5 steps with a railing? : A Lot 6 Click  Score: 19    End of Session Equipment Utilized During Treatment: Gait belt Activity Tolerance: Patient tolerated treatment well Patient left: in chair;with chair alarm set;with call bell/phone within reach;with family/visitor present Nurse Communication: Mobility status PT Visit Diagnosis: Unsteadiness on feet (R26.81);Other abnormalities of gait and mobility (R26.89);Muscle weakness (generalized) (M62.81);History of falling (Z91.81)    Time: 2376-2831 PT Time Calculation (min) (ACUTE ONLY): 48 min   Charges:   PT Evaluation $PT Eval Low Complexity: 1 Low PT Treatments $Gait Training: 8-22 mins        Acute Rehab  Pager: [phone]   Garwin Brothers, SPT  06/06/2021, 4:05 PM

## 2021-06-06 NOTE — Discharge Summary (Signed)
Physician Discharge Summary  Wayne Williams TLX:[id number] DOB: 08-20-1951 DOA: 06/03/2021  PCP: Laurey Morale, MD  Admit date: 06/03/2021 Discharge date: 06/06/2021  Time spent: 47 minutes  Recommendations for Outpatient Follow-up:  Get Chem-12 CBC in 1 week time Follow-up for cardiac rehab in outpatient setting Will need outpatient consideration for  Discharge Diagnoses:  MAIN problem for hospitalization   Multifocal pneumonia NSTEMI status post DES placement Acute toxic metabolic encephalopathy secondary to Xanax habituation  Please see below for itemized issues addressed in HOpsital- refer to other progress notes for clarity if needed  Discharge Condition: Fair  Diet recommendation: Diabetic heart healthy  Filed Weights   06/03/21 1616  Weight: 58.1 kg    History of present illness:  70 year old male community dwelling CAD + CABG X5 2001-last cath 12/2014-recommended medical management HFrEF 35-40% Continued tobacco/COPD Prior CVA DM TY 2 HTN BPH   Presented to ED 06/04/2021 SOB, tachycardia 130s-code STEMI called and canceled-CXR =?  PNA Troponin [id number] Cardiology felt demand ischemia from pneumonia/COPD superimposed on underlying heart disease Cardiac cath 06/04/2021-DES placed mid circumflex--clopidogrel-->Brilinta  Hospital Course:  Sepsis on admission secondary to pneumonia White count trended downwards during hospital stay Patient's family insistent on him discharging home Ceftriaxone doxycycline transition to Augmentin for another 6 days CXR 6/8 = left lung infiltrate Speech therapy cleared patient for discharge NSTEMI status post DES Discharging on aspirin and Plavix on discharge as Brilinta not affordable to the patient Outpatient cardiology follow-up Dr. [place] Acute superimposed on chronic systolic heart failure sinus tachycardia -1.8 L from admission He received several doses of Lasix overnight 6/7--hold for now Home dose of metoprolol is  50--changed to bisoprolol 2.5 per cardiology on discharge Moderate COPD not on home oxygen -- continue current smoker Patient willing to quit Chantix prescribed Inhalers called in for the patient and steroids discontinued given risk of metabolic encephalopathy BPH We will BladderScan him to ensure he does not retaining urine continue Flomax 0.4 Prior CVA Plavix as above History of diabetes mellitus type 2? Sugars are below 180-monitor trends only while on steroids Acute toxic metabolic encephalopathy Takes high doses of Xanax 3 mg at night for sleep for long time Patient was delirious and seem to be confused over 6 8 and 6 9 however 06/06/2021 afternoon patient was completely coherent His daughter requested discharge because of the risk of encephalopathy and delirium occurring in the hospital I had a detailed discussion with her about the risks versus benefits of discharge versus staying in the hospital and continue on IV antibiotics for another day She still wishes that he discharged home therefore we discharged him home with antibiotics Severe Malnutrition-BMI 17 Supplement as able  Procedures: Echocardiogram 6/7 IMPRESSIONS     1. Left ventricular ejection fraction, by estimation, is 35 to 40%. Left  ventricular ejection fraction by 3D volume is 35 %. The left ventricle has  moderately decreased function. The left ventricle demonstrates global  hypokinesis. There is mild  concentric left ventricular hypertrophy. Left ventricular diastolic  parameters are consistent with Grade I diastolic dysfunction (impaired  relaxation). Elevated left atrial pressure. There is severe hypokinesis of  the left ventricular, mid-apical  inferolateral wall. There is severe hypokinesis of the left ventricular,  basal-mid anteroseptal wall and inferoseptal wall. Findings suggest  occluded native LAD artery with patent bypass to distal LAD and occluded  arterial supply to OM/PLV territory.   2. Right  ventricular systolic function is normal. The right ventricular  size is normal. Tricuspid regurgitation  signal is inadequate for assessing  PA pressure.   3. Left atrial size was mildly dilated.   4. The mitral valve is normal in structure. Mild to moderate mitral valve  regurgitation.   5. The aortic valve is tricuspid. There is moderate calcification of the  aortic valve. There is moderate thickening of the aortic valve. Aortic  valve regurgitation is mild to moderate. Mild to moderate aortic valve  sclerosis/calcification is present,  without any evidence of aortic stenosis.   6. Aortic dilatation noted. There is borderline dilatation of the aortic  root, measuring 37 mm.     Card Cath 06/04/21   Conclusion     Prox RCA lesion is 60% stenosed. Unchanged from prior cath. Heavily calcified. SVG to PLA/PDA was occluded. RPDA lesion is 90% stenosed. Ost LM to Mid LM lesion is 40% stenosed. IVUS showed area of 4.83 mm2. Ost LAD to Prox LAD lesion is 50% stenosed. Prox LAD lesion is 100% stenosed. LIMA to LAD is patent 1st Diag lesion is 100% stenosed. SVG to diagonal is occluded. There are right to left collaterals. 2nd Mrg lesion is 100% stenosed. SVG to OM is occluded there are right to left collaterals. Prox Cx to Mid Cx lesion is 80% stenosed. IVUS showed area of 3.13 mm2. A drug-eluting stent was successfully placed using a SYNERGY XD 3.0X38, postdilated with a 3.75 Camilla balloon, and optimized with intravascular ultrasound. Post intervention, there is a 0% residual stenosis. LV end diastolic pressure is mildly elevated. There is no aortic valve stenosis.   Although the left main cross-sectional area was less than 6 mm, I elected not to intervene since it was greater than 4 mm and the LAD is occluded with a LIMA to LAD.   Given his ACS, will switch his clopidogrel to Brilinta.  If there are issues with either cost or bleeding, could go back to clopidogrel 300 mg x 1 followed by 75  mg daily.   Continue aggressive secondary prevention for LV dysfunction.   Moderate RCA lesion would likely require atherectomy given heavy calcification if intervention was required.  It did appear unchanged compared to 2016.  Consultations: cardiology  Discharge Exam: Vitals:   06/06/21 0941 06/06/21 1456  BP: 123/62   Pulse: 84   Resp: 19   Temp:    SpO2:  94%    Subj on day of d/c   Sleepy this morning and slightly confused however much better today in the afternoon walk 3 times with a walker no chest pain currently some cough no fever no chills Patient absolutely tells me he wants to go home and refuses to stay in the hospital citing that he will sign himself out [place] His daughter states that she can help manage him at home and I had a long discussion with her this morning about his delirium  General Exam on discharge  Awake coherent no distress EOMI NCAT no focal deficit no JVD chest clear Orientation intact S1-S2 no murmur Moderate air entry posterolaterally Abdomen soft no rebound no guarding No lower extremity edema Psych euthymic  Discharge Instructions   Discharge Instructions     AMB Referral to Cardiac Rehabilitation - Phase II   Complete by: As directed    Diagnosis:  Coronary Stents NSTEMI     After initial evaluation and assessments completed: Virtual Based Care may be provided alone or in conjunction with Phase 2 Cardiac Rehab based on patient barriers.: Yes   AMB Referral to Phase II  Cardiac Rehab   Complete by: As directed    Diagnosis: CABG   CABG X ___: 2   After initial evaluation and assessments completed: Virtual Based Care may be provided alone or in conjunction with Phase 2 Cardiac Rehab based on patient barriers.: Yes   Ambulatory referral to Physical Therapy   Complete by: As directed    Outpatient PT Evaluation and treatment. Please call daughter [name] with visit times @ [phone]. Thanks   Diet - low sodium  heart healthy   Complete by: As directed    Increase activity slowly   Complete by: As directed       Allergies as of 06/06/2021   No Known Allergies      Medication List     STOP taking these medications    simvastatin 40 MG tablet Commonly known as: ZOCOR   triamcinolone cream 0.1 % Commonly known as: KENALOG       TAKE these medications    albuterol 108 (90 Base) MCG/ACT inhaler Commonly known as: VENTOLIN HFA INHALE 2 PUFFS INTO THE LUNGS EVERY 4 (FOUR) HOURS AS NEEDED FOR WHEEZING OR SHORTNESS OF BREATH.   ALPRAZolam 0.5 MG tablet Commonly known as: XANAX TAKE 1 TABLET BY MOUTH EVERY 6 (SIX) HOURS AS NEEDED FOR ANXIETY. What changed: See the new instructions.   amoxicillin-clavulanate 875-125 MG tablet Commonly known as: AUGMENTIN Take 1 tablet by mouth every 12 (twelve) hours.   aspirin 81 MG tablet Take 81 mg by mouth at bedtime.   B COMPLEX VITAMINS PO Take 1 capsule by mouth every morning.   bisoprolol 5 MG tablet Commonly known as: ZEBETA Take 0.5 tablets (2.5 mg total) by mouth daily. Start taking on: June 07, 2021   Breo Ellipta 200-25 MCG/INH Aepb Generic drug: fluticasone furoate-vilanterol INHALE 1 PUFF INTO THE LUNGS DAILY IN THE AFTERNOON. What changed: when to take this   clopidogrel 75 MG tablet Commonly known as: PLAVIX TAKE 1 TABLET BY MOUTH EVERY DAY What changed: when to take this   Colchicine 0.6 MG Caps Commonly known as: Mitigare Take 0.6 mg by mouth every 6 (six) hours as needed.   guaiFENesin 600 MG 12 hr tablet Commonly known as: MUCINEX Take 2 tablets (1,200 mg total) by mouth 2 (two) times daily.   metoprolol succinate 50 MG 24 hr tablet Commonly known as: TOPROL-XL TAKE 1 TABLET BY MOUTH DAILY WITH OR IMMEDIATELY FOLLOWING A MEAL. What changed: See the new instructions.   mometasone-formoterol 200-5 MCG/ACT Aero Commonly known as: DULERA Inhale 2 puffs into the lungs 2 (two) times daily.   nitroGLYCERIN 0.4  MG SL tablet Commonly known as: NITROSTAT Place 1 tablet (0.4 mg total) under the tongue every 5 (five) minutes as needed for chest pain.   predniSONE 10 MG tablet Commonly known as: DELTASONE Take 1 tablet (10 mg total) by mouth daily with breakfast.   rosuvastatin 20 MG tablet Commonly known as: CRESTOR Take 1 tablet (20 mg total) by mouth daily. Start taking on: June 07, 2021   tamsulosin 0.4 MG Caps capsule Commonly known as: FLOMAX TAKE 1 CAPSULE BY MOUTH EVERY DAY What changed: when to take this   varenicline 0.5 MG X 11 & 1 MG X 42 tablet Commonly known as: Chantix Starting Month Pak Take one 0.5 mg tablet by mouth once daily for 3 days, then increase to one 0.5 mg tablet twice daily for 4 days, then increase to one 1 mg tablet twice daily.  ASK your doctor about these medications    metFORMIN 1000 MG tablet Commonly known as: GLUCOPHAGE TAKE 1 TABLET BY MOUTH TWICE A DAY               Durable Medical Equipment  (From admission, onward)           Start     Ordered   06/06/21 1711  DME Walker  Once       Question Answer Comment  Walker: With Holiday Wheels   Patient needs a walker to treat with the following condition Debility      06/06/21 1711           No Known Allergies  Follow-up [facility].   Specialty: Rehabilitation Why: Office to call you with an appointment time; however, if they have not called within 2-3 weeks please call them to schedule. Contact information: [place]. [id number] [place] [phone]                 The results of significant diagnostics from this hospitalization (including imaging, microbiology, ancillary and laboratory) are listed below for reference.    Significant Diagnostic Studies: DG Chest 1 View  Result Date: 06/05/2021 CLINICAL DATA:  Shortness of breath. EXAM: CHEST  1 VIEW  COMPARISON:  06/03/2001. FINDINGS: Prior CABG. Heart size normal. Diffuse left lung infiltrate. Small bilateral pleural effusions, left side greater than right noted. No pneumothorax. IMPRESSION: 1. Diffuse left lung infiltrate. Small bilateral pleural effusions, left side greater than right. 2.  Prior CABG.  Heart size normal. Electronically Signed   By: Marcello Moores  Register   On: 06/05/2021 05:04   CARDIAC CATHETERIZATION  Result Date: 06/04/2021  Prox RCA lesion is 60% stenosed. Unchanged from prior cath. Heavily calcified. SVG to PLA/PDA was occluded.  RPDA lesion is 90% stenosed.  Ost LM to Mid LM lesion is 40% stenosed. IVUS showed area of 4.83 mm2.  Ost LAD to Prox LAD lesion is 50% stenosed.  Prox LAD lesion is 100% stenosed. LIMA to LAD is patent  1st Diag lesion is 100% stenosed. SVG to diagonal is occluded. There are right to left collaterals.  2nd Mrg lesion is 100% stenosed. SVG to OM is occluded there are right to left collaterals.  Prox Cx to Mid Cx lesion is 80% stenosed. IVUS showed area of 3.13 mm2.  A drug-eluting stent was successfully placed using a SYNERGY XD 3.0X38, postdilated with a 3.75 [place]cock balloon, and optimized with intravascular ultrasound.  Post intervention, there is a 0% residual stenosis.  LV end diastolic pressure is mildly elevated.  There is no aortic valve stenosis.  Although the left main cross-sectional area was less than 6 mm, I elected not to intervene since it was greater than 4 mm and the LAD is occluded with a LIMA to LAD. Given his ACS, will switch his clopidogrel to Brilinta.  If there are issues with either cost or bleeding, could go back to clopidogrel 300 mg x 1 followed by 75 mg daily. Continue aggressive secondary prevention for LV dysfunction. Moderate RCA lesion would likely require atherectomy given heavy calcification if intervention was required.  It did appear unchanged compared to 2016.   DG Chest Portable 1 View  Result Date:  06/03/2021 CLINICAL DATA:  Chest pain.  COPD exacerbation. EXAM: PORTABLE CHEST 1 VIEW COMPARISON:  Most recent radiograph 03/02/2021. FINDINGS: Post median sternotomy and CABG. Normal heart size.  Aortic atherosclerosis. Chronic hyperinflation and bronchial thickening. Blunting of the left greater than right costophrenic angle, similar to prior exam. Minimal ill-defined opacity at the right lung base. No pneumothorax. No pulmonary edema. No acute osseous abnormalities are seen. IMPRESSION: 1. Minimal ill-defined opacity at the right lung base, atelectasis versus pneumonia. 2. Chronic hyperinflation and bronchial thickening, imaging findings consistent with COPD. 3. Blunting of the costophrenic angles appear similar, may be due to hyperinflation versus small effusions. Electronically Signed   By: Keith Rake M.D.   On: 06/03/2021 17:23   ECHOCARDIOGRAM COMPLETE  Result Date: 06/04/2021    ECHOCARDIOGRAM REPORT   Patient Name:   Wayne Williams Date of Exam: 06/04/2021 Medical Rec #:  [id number]      Height:       72.0 in Accession #:    [id number]     Weight:       128.0 lb Date of Birth:  1951/01/24      BSA:          1.762 m Patient Age:    23 years       BP:           113/59 mmHg Patient Gender: M              HR:           87 bpm. Exam Location:  Inpatient Procedure: 2D Echo, Cardiac Doppler, Color Doppler and 3D Echo Indications:    NSTEMI I21.4  History:        Patient has prior history of Echocardiogram examinations, most                 recent 01/16/2015. Acute MI and CAD, COPD, Aortic Valve Disease;                 Risk Factors:Hypertension and Current Smoker.  Sonographer:    Darlina Sicilian RDCS Referring Phys: [id number] Freada Bergeron  Sonographer Comments: Technically difficult study due to poor echo windows. Image acquisition challenging due to COPD. Global longitudinal strain was attempted. IMPRESSIONS  1. Left ventricular ejection fraction, by estimation, is 35 to 40%. Left ventricular ejection  fraction by 3D volume is 35 %. The left ventricle has moderately decreased function. The left ventricle demonstrates global hypokinesis. There is mild concentric left ventricular hypertrophy. Left ventricular diastolic parameters are consistent with Grade I diastolic dysfunction (impaired relaxation). Elevated left atrial pressure. There is severe hypokinesis of the left ventricular, mid-apical inferolateral wall. There is severe hypokinesis of the left ventricular, basal-mid anteroseptal wall and inferoseptal wall. Findings suggest occluded native LAD artery with patent bypass to distal LAD and occluded arterial supply to OM/PLV territory.  2. Right ventricular systolic function is normal. The right ventricular size is normal. Tricuspid regurgitation signal is inadequate for assessing PA pressure.  3. Left atrial size was mildly dilated.  4. The mitral valve is normal in structure. Mild to moderate mitral valve regurgitation.  5. The aortic valve is tricuspid. There is moderate calcification of the aortic valve. There is moderate thickening of the aortic valve. Aortic valve regurgitation is mild to moderate. Mild to moderate aortic valve sclerosis/calcification is present, without any evidence of aortic stenosis.  6. Aortic dilatation noted. There is borderline dilatation of the aortic root, measuring 37 mm. Comparison(s): Prior images unable to be directly viewed, comparison made by report only. The left ventricular function is unchanged. The left ventricular inferolateral wall motion abnormality is new. FINDINGS  Left Ventricle: Left ventricular ejection  fraction, by estimation, is 35 to 40%. Left ventricular ejection fraction by 3D volume is 35 %. The left ventricle has moderately decreased function. The left ventricle demonstrates global hypokinesis. Severe hypokinesis of the left ventricular, mid-apical inferolateral wall. Severe hypokinesis of the left ventricular, basal-mid anteroseptal wall and inferoseptal  wall. Global longitudinal strain performed but not reported based on interpreter judgement due to  suboptimal tracking. The left ventricular internal cavity size was normal in size. There is mild concentric left ventricular hypertrophy. Abnormal (paradoxical) septal motion, consistent with left bundle branch block. Left ventricular diastolic parameters are consistent with Grade I diastolic dysfunction (impaired relaxation). Elevated left atrial pressure.  LV Wall Scoring: Findings suggest occluded native LAD artery with patent bypass to distal LAD and occluded arterial supply to OM/PLV territory. Right Ventricle: The right ventricular size is normal. No increase in right ventricular wall thickness. Right ventricular systolic function is normal. Tricuspid regurgitation signal is inadequate for assessing PA pressure. Left Atrium: Left atrial size was mildly dilated. Right Atrium: Right atrial size was normal in size. Pericardium: There is no evidence of pericardial effusion. Mitral Valve: The mitral valve is normal in structure. Mild to moderate mitral valve regurgitation, with centrally-directed jet. Tricuspid Valve: The tricuspid valve is normal in structure. Tricuspid valve regurgitation is not demonstrated. Aortic Valve: The aortic valve is tricuspid. There is moderate calcification of the aortic valve. There is moderate thickening of the aortic valve. Aortic valve regurgitation is mild to moderate. Aortic regurgitation PHT measures 489 msec. Mild to moderate aortic valve sclerosis/calcification is present, without any evidence of aortic stenosis. Pulmonic Valve: The pulmonic valve was grossly normal. Pulmonic valve regurgitation is not visualized. Aorta: Aortic dilatation noted. There is borderline dilatation of the aortic root, measuring 37 mm. IAS/Shunts: No atrial level shunt detected by color flow Doppler.  LEFT VENTRICLE PLAX 2D LVIDd:         5.00 cm         Diastology LVIDs:         4.00 cm         LV e'  medial:    3.59 cm/s LV PW:         1.10 cm         LV E/e' medial:  21.1 LV IVS:        1.20 cm         LV e' lateral:   7.07 cm/s LVOT diam:     2.40 cm         LV E/e' lateral: 10.7 LV SV:         86 LV SV Index:   49 LVOT Area:     4.52 cm        3D Volume EF                                LV 3D EF:    Left                                             ventricular                                             ejection  fraction by                                             3D volume                                             is 35 %.                                 3D Volume EF:                                3D EF:        35 % RIGHT VENTRICLE RV S prime:     11.60 cm/s TAPSE (M-mode): 1.3 cm LEFT ATRIUM             Index       RIGHT ATRIUM          Index LA diam:        3.70 cm 2.10 cm/m  RA Area:     9.30 cm LA Vol (A2C):   46.0 ml 26.10 ml/m RA Volume:   20.60 ml 11.69 ml/m LA Vol (A4C):   43.1 ml 24.45 ml/m LA Biplane Vol: 45.5 ml 25.82 ml/m  AORTIC VALVE LVOT Vmax:   91.70 cm/s LVOT Vmean:  67.800 cm/s LVOT VTI:    0.190 m AI PHT:      489 msec  AORTA Ao Root diam: 3.70 cm MITRAL VALVE MV Area (PHT): 5.66 cm    SHUNTS MV Decel Time: 134 msec    Systemic VTI:  0.19 m MV E velocity: 75.80 cm/s  Systemic Diam: 2.40 cm MV A velocity: 92.50 cm/s MV E/A ratio:  0.82 Mihai Croitoru MD Electronically signed by Sanda Klein MD Signature Date/Time: 06/04/2021/9:43:26 AM    Final     Microbiology: Recent Results (from the past 240 hour(s))  Resp Panel by RT-PCR (Flu A&B, Covid) Nasopharyngeal Swab     Status: None   Collection Time: 06/03/21  4:13 PM   Specimen: Nasopharyngeal Swab; Nasopharyngeal(NP) swabs in vial transport medium  Result Value Ref Range Status   SARS Coronavirus 2 by RT PCR NEGATIVE NEGATIVE Final    Comment: (NOTE) SARS-CoV-2 target nucleic acids are NOT DETECTED.  The SARS-CoV-2 RNA is generally detectable in upper respiratory specimens  during the acute phase of infection. The lowest concentration of SARS-CoV-2 viral copies this assay can detect is 138 copies/mL. A negative result does not preclude SARS-Cov-2 infection and should not be used as the sole basis for treatment or other patient management decisions. A negative result may occur with  improper specimen collection/handling, submission of specimen other than nasopharyngeal swab, presence of viral mutation(s) within the areas targeted by this assay, and inadequate number of viral copies(<138 copies/mL). A negative result must be combined with clinical observations, patient history, and epidemiological information. The expected result is Negative.  Fact Sheet for Patients:  EntrepreneurPulse.com.au  Fact Sheet for Healthcare Providers:  IncredibleEmployment.be  This test is no t yet approved or cleared by the [place] FDA and  has been authorized for detection and/or diagnosis of SARS-CoV-2  by FDA under an Emergency Use Authorization (EUA). This EUA will remain  in effect (meaning this test can be used) for the duration of the COVID-19 declaration under Section 564(b)(1) of the Act, 21 U.S.C.section 360bbb-3(b)(1), unless the authorization is terminated  or revoked sooner.       Influenza A by PCR NEGATIVE NEGATIVE Final   Influenza B by PCR NEGATIVE NEGATIVE Final    Comment: (NOTE) The Xpert Xpress SARS-CoV-2/FLU/RSV plus assay is intended as an aid in the diagnosis of influenza from Nasopharyngeal swab specimens and should not be used as a sole basis for treatment. Nasal washings and aspirates are unacceptable for Xpert Xpress SARS-CoV-2/FLU/RSV testing.  Fact Sheet for Patients: EntrepreneurPulse.com.au  Fact Sheet for Healthcare Providers: IncredibleEmployment.be  This test is not yet approved or cleared by the [place] FDA and has been authorized for detection  and/or diagnosis of SARS-CoV-2 by FDA under an Emergency Use Authorization (EUA). This EUA will remain in effect (meaning this test can be used) for the duration of the COVID-19 declaration under Section 564(b)(1) of the Act, 21 U.S.C. section 360bbb-3(b)(1), unless the authorization is terminated or revoked.  Performed at [facility], [place] [address] [place], [place] [address]      Labs: Basic Metabolic Panel: Recent Labs  Lab 06/03/21 1613 06/03/21 1634 06/04/21 0310 06/05/21 0301 06/06/21 0319  NA 138 139 137 138 140  K 4.3 4.2 4.4 4.1 4.2  CL 102  --  104 104 103  CO2 26  --  23 25 27   GLUCOSE 144*  --  235* 153* 64*  BUN 20  --  32* 47* 38*  CREATININE 1.37*  --  1.45* 1.25* 1.40*  CALCIUM 8.9  --  8.5* 8.6* 8.7*  MG  --   --  1.8  --  2.1  PHOS  --   --  2.7  --   --    Liver Function Tests: Recent Labs  Lab 06/06/21 0319  AST 51*  ALT 35  ALKPHOS 42  BILITOT 0.5  PROT 5.7*  ALBUMIN 2.9*   No results for input(s): LIPASE, AMYLASE in the last 168 hours. No results for input(s): AMMONIA in the last 168 hours. CBC: Recent Labs  Lab 06/03/21 1613 06/03/21 1634 06/04/21 0643 06/05/21 0301 06/06/21 0319  WBC 17.4*  --  19.6* 18.9* 13.8*  NEUTROABS 14.8*  --   --   --   --   HGB 13.7 14.3 12.0* 10.8* 10.3*  HCT 42.1 42.0 36.4* 33.1* 31.7*  MCV 96.8  --  97.1 96.8 96.6  PLT 266  --  195 212 213   Cardiac Enzymes: No results for input(s): CKTOTAL, CKMB, CKMBINDEX, TROPONINI in the last 168 hours. BNP: BNP (last 3 results) Recent Labs    06/03/21 1639 06/05/21 0613 06/05/21 1900  BNP 296.3* 723.8* 1,133.8*    ProBNP (last 3 results) No results for input(s): PROBNP in the last 8760 hours.  CBG: Recent Labs  Lab 06/05/21 2359 06/06/21 0527 06/06/21 0924 06/06/21 1210 06/06/21 1625  GLUCAP 212* 104* 193* 200* 292*       Signed:  Nita Sells MD   [facility] 06/06/2021, 5:38 PM

## 2021-06-06 NOTE — Evaluation (Signed)
Occupational Therapy Evaluation Patient Details Name: Wayne Williams MRN: [id number] DOB: 10-15-1951 Today's Date: 06/06/2021    History of Present Illness Pt is a 70 y.o. male admitted 06/03/21 with SOB; workup for NSTEMI, COPD exacerbation, RLL PNA. S/p cardiac cath and stent on 6/7. PMH includes CAD (s/p CABG 2001), HF, COPD, CVA, DM2, HTN, anxiety, tobacco use.   Clinical Impression   Patient lives alone in a two story condo with no steps to enter. Daughter reports for about a year patient has not been using upstairs, patient sleeps in recliner and sponge bathes vs going upstairs to tub/shower. Patient independent with self care tasks, uses cane intermittently as needed. Currently patient overall min G for safety for functional ambulation due to x1 lateral loss of balance ambulating back from bathroom. Patient endorses feeling weaker than his baseline "I'll probably use the cane for a few days when I get home." Recommend continued acute OT services to maximize patient safety and independence with self care in order to facilitate D/C home.     Follow Up Recommendations  No OT follow up    Equipment Recommendations  None recommended by OT       Precautions / Restrictions Precautions Precautions: Fall Restrictions Weight Bearing Restrictions: No      Mobility Bed Mobility Overal bed mobility: Modified Independent             General bed mobility comments: HOB elevated, increased time    Transfers Overall transfer level: Needs assistance Equipment used: None Transfers: Sit to/from Stand Sit to Stand: Supervision         General transfer comment: S for safety    Balance Overall balance assessment: Mild deficits observed, not formally tested                                         ADL either performed or assessed with clinical judgement   ADL Overall ADL's : Needs assistance/impaired Eating/Feeding: Independent;Sitting Eating/Feeding Details  (indicate cue type and reason): drinking coffee Grooming: Set up;Sitting   Upper Body Bathing: Set up;Sitting   Lower Body Bathing: Min guard;Sit to/from stand   Upper Body Dressing : Set up;Sitting   Lower Body Dressing: Min guard;Sit to/from stand;Sitting/lateral leans Lower Body Dressing Details (indicate cue type and reason): patient able to don slide shoes seated, min G in standing due to x1 lateral LOB Toilet Transfer: Min guard;Ambulation Toilet Transfer Details (indicate cue type and reason): patient ambulate to bathroom at supervision level, endorses feeling weaker than his baseline. Able to stand at toilet with distant supervision, on way back from bathroom x1 lateral loss of balance min G for safety. Toileting- Clothing Manipulation and Hygiene: Supervision/safety;Sit to/from stand       Functional mobility during ADLs: Min guard General ADL Comments: patient educated on safety of having someone with him during ambulation as he is weaker and mildly unsteady than his baseline, patient is agreeable to this just doesn't like when people don't give him privacy or put hands on him "roughly"      Pertinent Vitals/Pain Pain Assessment: Faces Faces Pain Scale: Hurts a little bit Pain Location: upper thigh from cath site Pain Descriptors / Indicators: Sore Pain Intervention(s): Monitored during session     Hand Dominance Right   Extremity/Trunk Assessment Upper Extremity Assessment Upper Extremity Assessment: Generalized weakness   Lower Extremity Assessment Lower Extremity Assessment: Defer to PT  evaluation   Cervical / Trunk Assessment Cervical / Trunk Assessment: Normal   Communication Communication Communication: No difficulties   Cognition Arousal/Alertness: Awake/alert Behavior During Therapy: WFL for tasks assessed/performed Overall Cognitive Status: Within Functional Limits for tasks assessed                                 General Comments:  tangential, otherwise following 1 step directions appropriately. does not like being asked repetitive questions such as DOB therefore did not ask formal orientation questions              Home Living Family/patient expects to be discharged to:: Private residence Living Arrangements: Alone Available Help at Discharge: Family Type of Home: Other(Comment) (Condo) Home Access: Level entry     Home Layout: Two level;Able to live on main level with bedroom/bathroom     Bathroom Shower/Tub: Teacher, early years/pre: Standard     Home Equipment: Kasandra Knudsen - single point (daughter going to bring patient a walker)   Additional Comments: hasnt been using upstairs for about a year      Prior Functioning/Environment Level of Independence: Independent        Comments: patient reports sleeping in recliner downstairs and sponge bathing vs going upstairs to tub/shower. also uses cane as needed        OT Problem List: Decreased activity tolerance;Impaired balance (sitting and/or standing);Decreased strength      OT Treatment/Interventions: Self-care/ADL training;Therapeutic exercise;Balance training;Patient/family education;Therapeutic activities    OT Goals(Current goals can be found in the care plan section) Acute Rehab OT Goals Patient Stated Goal: go home OT Goal Formulation: With patient Time For Goal Achievement: 06/20/21 Potential to Achieve Goals: Good  OT Frequency: Min 2X/week    AM-PAC OT "6 Clicks" Daily Activity     Outcome Measure Help from another person eating meals?: None Help from another person taking care of personal grooming?: A Little Help from another person toileting, which includes using toliet, bedpan, or urinal?: A Little Help from another person bathing (including washing, rinsing, drying)?: A Little Help from another person to put on and taking off regular upper body clothing?: A Little Help from another person to put on and taking off regular  lower body clothing?: A Little 6 Click Score: 19   End of Session Nurse Communication: Mobility status  Activity Tolerance: Patient tolerated treatment well Patient left: with call bell/phone within reach;with family/visitor present (sitting EOB)  OT Visit Diagnosis: Unsteadiness on feet (R26.81)                Time: 6979-4801 OT Time Calculation (min): 27 min Charges:  OT General Charges $OT Visit: 1 Visit OT Evaluation $OT Eval Low Complexity: 1 Low OT Treatments $Self Care/Home Management : 8-22 mins  Delbert Phenix OT OT pager: [phone]  Rosemary Holms 06/06/2021, 1:09 PM

## 2021-06-06 NOTE — Progress Notes (Signed)
Progress Note  Patient Name: Wayne Williams Date of Encounter: 06/06/2021  [facility] Cardiologist: Dr. Percival Spanish   Subjective   Patient denies chest pain or dyspnea. Repeats "how I look". Daughter thinks his dad is more confused today.   Inpatient Medications    Scheduled Meds:  aspirin  81 mg Oral Daily   bisoprolol  2.5 mg Oral Daily   clopidogrel  75 mg Oral Daily   doxycycline  100 mg Oral Q12H   enoxaparin (LOVENOX) injection  40 mg Subcutaneous Daily   feeding supplement  237 mL Oral TID BM   fluticasone furoate-vilanterol  1 puff Inhalation QHS   guaiFENesin  1,200 mg Oral BID   insulin aspart  0-9 Units Subcutaneous Q4H   ipratropium-albuterol  3 mL Nebulization TID   mometasone-formoterol  2 puff Inhalation BID   multivitamin with minerals  1 tablet Oral Daily   pantoprazole  40 mg Oral Daily   predniSONE  40 mg Oral QAC breakfast   rosuvastatin  20 mg Oral Daily   sodium chloride flush  3 mL Intravenous Q12H   sodium chloride flush  3 mL Intravenous Q12H   tamsulosin  0.4 mg Oral Daily   Continuous Infusions:  sodium chloride     cefTRIAXone (ROCEPHIN)  IV 2 g (06/05/21 1749)   PRN Meds: sodium chloride, acetaminophen, albuterol, nitroGLYCERIN, ondansetron (ZOFRAN) IV, sodium chloride flush   Vital Signs    Vitals:   06/05/21 1920 06/05/21 1924 06/05/21 2354 06/06/21 0529  BP: 114/64  125/68 112/75  Pulse: 96  100 99  Resp: 20  (!) 21   Temp: 98.3 F (36.8 C)  97.8 F (36.6 C) 97.6 F (36.4 C)  TempSrc: Oral  Oral Axillary  SpO2: 98% 98% 93% 93%  Weight:      Height:        Intake/Output Summary (Last 24 hours) at 06/06/2021 0804 Last data filed at 06/06/2021 0539 Gross per 24 hour  Intake --  Output 1225 ml  Net -1225 ml   Last 3 Weights 06/03/2021 05/20/2021 04/02/2021  Weight (lbs) 128 lb 129 lb 128 lb 6.4 oz  Weight (kg) 58.06 kg 58.514 kg 58.242 kg      Telemetry    SR with PVCs- Personally Reviewed  ECG    N/A  Physical Exam    GEN: Illl appearing elderly male in no acute distress.   Neck: No JVD Cardiac: RRR, no murmurs, rubs, or gallops.  Respiratory: Clear to auscultation bilaterally. GI: Soft, nontender, non-distended  MS: No edema; No deformity. Neuro:  Nonfocal, A & O x 3 but repeats "how I look" Psych: Normal affect   Labs    High Sensitivity Troponin:   Recent Labs  Lab 06/03/21 1639 06/03/21 1820  TROPONINIHS 2,767* 3,942*      Chemistry Recent Labs  Lab 06/04/21 0310 06/05/21 0301 06/06/21 0319  NA 137 138 140  K 4.4 4.1 4.2  CL 104 104 103  CO2 23 25 27   GLUCOSE 235* 153* 64*  BUN 32* 47* 38*  CREATININE 1.45* 1.25* 1.40*  CALCIUM 8.5* 8.6* 8.7*  PROT  --   --  5.7*  ALBUMIN  --   --  2.9*  AST  --   --  51*  ALT  --   --  35  ALKPHOS  --   --  42  BILITOT  --   --  0.5  GFRNONAA 51* >60 53*  ANIONGAP 10 9 10  Hematology Recent Labs  Lab 06/04/21 0643 06/05/21 0301 06/06/21 0319  WBC 19.6* 18.9* 13.8*  RBC 3.75* 3.42* 3.28*  HGB 12.0* 10.8* 10.3*  HCT 36.4* 33.1* 31.7*  MCV 97.1 96.8 96.6  MCH 32.0 31.6 31.4  MCHC 33.0 32.6 32.5  RDW 14.8 14.8 14.8  PLT 195 212 213    BNP Recent Labs  Lab 06/03/21 1639 06/05/21 0613 06/05/21 1900  BNP 296.3* 723.8* 1,133.8*      Radiology    DG Chest 1 View  Result Date: 06/05/2021 CLINICAL DATA:  Shortness of breath. EXAM: CHEST  1 VIEW COMPARISON:  06/03/2001. FINDINGS: Prior CABG. Heart size normal. Diffuse left lung infiltrate. Small bilateral pleural effusions, left side greater than right noted. No pneumothorax. IMPRESSION: 1. Diffuse left lung infiltrate. Small bilateral pleural effusions, left side greater than right. 2.  Prior CABG.  Heart size normal. Electronically Signed   By: Marcello Moores  Register   On: 06/05/2021 05:04   CARDIAC CATHETERIZATION  Result Date: 06/04/2021  Prox RCA lesion is 60% stenosed. Unchanged from prior cath. Heavily calcified. SVG to PLA/PDA was occluded.  RPDA lesion is 90%  stenosed.  Ost LM to Mid LM lesion is 40% stenosed. IVUS showed area of 4.83 mm2.  Ost LAD to Prox LAD lesion is 50% stenosed.  Prox LAD lesion is 100% stenosed. LIMA to LAD is patent  1st Diag lesion is 100% stenosed. SVG to diagonal is occluded. There are right to left collaterals.  2nd Mrg lesion is 100% stenosed. SVG to OM is occluded there are right to left collaterals.  Prox Cx to Mid Cx lesion is 80% stenosed. IVUS showed area of 3.13 mm2.  A drug-eluting stent was successfully placed using a SYNERGY XD 3.0X38, postdilated with a 3.75 [employer] balloon, and optimized with intravascular ultrasound.  Post intervention, there is a 0% residual stenosis.  LV end diastolic pressure is mildly elevated.  There is no aortic valve stenosis.  Although the left main cross-sectional area was less than 6 mm, I elected not to intervene since it was greater than 4 mm and the LAD is occluded with a LIMA to LAD. Given his ACS, will switch his clopidogrel to Brilinta.  If there are issues with either cost or bleeding, could go back to clopidogrel 300 mg x 1 followed by 75 mg daily. Continue aggressive secondary prevention for LV dysfunction. Moderate RCA lesion would likely require atherectomy given heavy calcification if intervention was required.  It did appear unchanged compared to 2016.   ECHOCARDIOGRAM COMPLETE  Result Date: 06/04/2021    ECHOCARDIOGRAM REPORT   Patient Name:   Wayne Williams Date of Exam: 06/04/2021 Medical Rec #:  [id number]      Height:       72.0 in Accession #:    [id number]     Weight:       128.0 lb Date of Birth:  [birth year]      BSA:          1.762 m Patient Age:    28 years       BP:           113/59 mmHg Patient Gender: M              HR:           87 bpm. Exam Location:  Inpatient Procedure: 2D Echo, Cardiac Doppler, Color Doppler and 3D Echo Indications:    NSTEMI I21.4  History:  Patient has prior history of Echocardiogram examinations, most                 recent 01/16/2015. Acute  MI and CAD, COPD, Aortic Valve Disease;                 Risk Factors:Hypertension and Current Smoker.  Sonographer:    Darlina Sicilian RDCS Referring Phys: [id number] Freada Bergeron  Sonographer Comments: Technically difficult study due to poor echo windows. Image acquisition challenging due to COPD. Global longitudinal strain was attempted. IMPRESSIONS  1. Left ventricular ejection fraction, by estimation, is 35 to 40%. Left ventricular ejection fraction by 3D volume is 35 %. The left ventricle has moderately decreased function. The left ventricle demonstrates global hypokinesis. There is mild concentric left ventricular hypertrophy. Left ventricular diastolic parameters are consistent with Grade I diastolic dysfunction (impaired relaxation). Elevated left atrial pressure. There is severe hypokinesis of the left ventricular, mid-apical inferolateral wall. There is severe hypokinesis of the left ventricular, basal-mid anteroseptal wall and inferoseptal wall. Findings suggest occluded native LAD artery with patent bypass to distal LAD and occluded arterial supply to OM/PLV territory.  2. Right ventricular systolic function is normal. The right ventricular size is normal. Tricuspid regurgitation signal is inadequate for assessing PA pressure.  3. Left atrial size was mildly dilated.  4. The mitral valve is normal in structure. Mild to moderate mitral valve regurgitation.  5. The aortic valve is tricuspid. There is moderate calcification of the aortic valve. There is moderate thickening of the aortic valve. Aortic valve regurgitation is mild to moderate. Mild to moderate aortic valve sclerosis/calcification is present, without any evidence of aortic stenosis.  6. Aortic dilatation noted. There is borderline dilatation of the aortic root, measuring 37 mm. Comparison(s): Prior images unable to be directly viewed, comparison made by report only. The left ventricular function is unchanged. The left ventricular  inferolateral wall motion abnormality is new. FINDINGS  Left Ventricle: Left ventricular ejection fraction, by estimation, is 35 to 40%. Left ventricular ejection fraction by 3D volume is 35 %. The left ventricle has moderately decreased function. The left ventricle demonstrates global hypokinesis. Severe hypokinesis of the left ventricular, mid-apical inferolateral wall. Severe hypokinesis of the left ventricular, basal-mid anteroseptal wall and inferoseptal wall. Global longitudinal strain performed but not reported based on interpreter judgement due to  suboptimal tracking. The left ventricular internal cavity size was normal in size. There is mild concentric left ventricular hypertrophy. Abnormal (paradoxical) septal motion, consistent with left bundle branch block. Left ventricular diastolic parameters are consistent with Grade I diastolic dysfunction (impaired relaxation). Elevated left atrial pressure.  LV Wall Scoring: Findings suggest occluded native LAD artery with patent bypass to distal LAD and occluded arterial supply to OM/PLV territory. Right Ventricle: The right ventricular size is normal. No increase in right ventricular wall thickness. Right ventricular systolic function is normal. Tricuspid regurgitation signal is inadequate for assessing PA pressure. Left Atrium: Left atrial size was mildly dilated. Right Atrium: Right atrial size was normal in size. Pericardium: There is no evidence of pericardial effusion. Mitral Valve: The mitral valve is normal in structure. Mild to moderate mitral valve regurgitation, with centrally-directed jet. Tricuspid Valve: The tricuspid valve is normal in structure. Tricuspid valve regurgitation is not demonstrated. Aortic Valve: The aortic valve is tricuspid. There is moderate calcification of the aortic valve. There is moderate thickening of the aortic valve. Aortic valve regurgitation is mild to moderate. Aortic regurgitation PHT measures 489 msec. Mild to moderate  aortic valve sclerosis/calcification is present, without any evidence of aortic stenosis. Pulmonic Valve: The pulmonic valve was grossly normal. Pulmonic valve regurgitation is not visualized. Aorta: Aortic dilatation noted. There is borderline dilatation of the aortic root, measuring 37 mm. IAS/Shunts: No atrial level shunt detected by color flow Doppler.  LEFT VENTRICLE PLAX 2D LVIDd:         5.00 cm         Diastology LVIDs:         4.00 cm         LV e' medial:    3.59 cm/s LV PW:         1.10 cm         LV E/e' medial:  21.1 LV IVS:        1.20 cm         LV e' lateral:   7.07 cm/s LVOT diam:     2.40 cm         LV E/e' lateral: 10.7 LV SV:         86 LV SV Index:   49 LVOT Area:     4.52 cm        3D Volume EF                                LV 3D EF:    Left                                             ventricular                                             ejection                                             fraction by                                             3D volume                                             is 35 %.                                 3D Volume EF:                                3D EF:        35 % RIGHT VENTRICLE RV S prime:     11.60 cm/s TAPSE (M-mode): 1.3 cm LEFT ATRIUM             Index       RIGHT ATRIUM          Index  LA diam:        3.70 cm 2.10 cm/m  RA Area:     9.30 cm LA Vol (A2C):   46.0 ml 26.10 ml/m RA Volume:   20.60 ml 11.69 ml/m LA Vol (A4C):   43.1 ml 24.45 ml/m LA Biplane Vol: 45.5 ml 25.82 ml/m  AORTIC VALVE LVOT Vmax:   91.70 cm/s LVOT Vmean:  67.800 cm/s LVOT VTI:    0.190 m AI PHT:      489 msec  AORTA Ao Root diam: 3.70 cm MITRAL VALVE MV Area (PHT): 5.66 cm    SHUNTS MV Decel Time: 134 msec    Systemic VTI:  0.19 m MV E velocity: 75.80 cm/s  Systemic Diam: 2.40 cm MV A velocity: 92.50 cm/s MV E/A ratio:  0.82 Mihai Croitoru MD Electronically signed by Sanda Klein MD Signature Date/Time: 06/04/2021/9:43:26 AM    Final     Cardiac Studies   Cath:  06/04/21   Prox RCA lesion is 60% stenosed. Unchanged from prior cath. Heavily calcified. SVG to PLA/PDA was occluded. RPDA lesion is 90% stenosed. Ost LM to Mid LM lesion is 40% stenosed. IVUS showed area of 4.83 mm2. Ost LAD to Prox LAD lesion is 50% stenosed. Prox LAD lesion is 100% stenosed. LIMA to LAD is patent 1st Diag lesion is 100% stenosed. SVG to diagonal is occluded. There are right to left collaterals. 2nd Mrg lesion is 100% stenosed. SVG to OM is occluded there are right to left collaterals. Prox Cx to Mid Cx lesion is 80% stenosed. IVUS showed area of 3.13 mm2. A drug-eluting stent was successfully placed using a SYNERGY XD 3.0X38, postdilated with a 3.75 [place] balloon, and optimized with intravascular ultrasound. Post intervention, there is a 0% residual stenosis. LV end diastolic pressure is mildly elevated. There is no aortic valve stenosis.   Although the left main cross-sectional area was less than 6 mm, I elected not to intervene since it was greater than 4 mm and the LAD is occluded with a LIMA to LAD.   Given his ACS, will switch his clopidogrel to Brilinta.  If there are issues with either cost or bleeding, could go back to clopidogrel 300 mg x 1 followed by 75 mg daily.   Continue aggressive secondary prevention for LV dysfunction.   Moderate RCA lesion would likely require atherectomy given heavy calcification if intervention was required.  It did appear unchanged compared to 2016.   Diagnostic Dominance: Co-dominant      Intervention        Echo: 06/04/21   IMPRESSIONS     1. Left ventricular ejection fraction, by estimation, is 35 to 40%. Left  ventricular ejection fraction by 3D volume is 35 %. The left ventricle has  moderately decreased function. The left ventricle demonstrates global  hypokinesis. There is mild  concentric left ventricular hypertrophy. Left ventricular diastolic  parameters are consistent with Grade I diastolic dysfunction  (impaired  relaxation). Elevated left atrial pressure. There is severe hypokinesis of  the left ventricular, mid-apical  inferolateral wall. There is severe hypokinesis of the left ventricular,  basal-mid anteroseptal wall and inferoseptal wall. Findings suggest  occluded native LAD artery with patent bypass to distal LAD and occluded  arterial supply to OM/PLV territory.   2. Right ventricular systolic function is normal. The right ventricular  size is normal. Tricuspid regurgitation signal is inadequate for assessing  PA pressure.   3. Left atrial size was mildly dilated.   4. The mitral valve  is normal in structure. Mild to moderate mitral valve  regurgitation.   5. The aortic valve is tricuspid. There is moderate calcification of the  aortic valve. There is moderate thickening of the aortic valve. Aortic  valve regurgitation is mild to moderate. Mild to moderate aortic valve  sclerosis/calcification is present,  without any evidence of aortic stenosis.   6. Aortic dilatation noted. There is borderline dilatation of the aortic  root, measuring 37 mm.   Comparison(s): Prior images unable to be directly viewed, comparison made  by report only. The left ventricular function is unchanged. The left  ventricular inferolateral wall motion abnormality is new  Patient Profile     70 y.o. male with history of CAD s/p remote CABG in 2001, tobacco abuse, chronic systolic CHF with EF 44%, CKD, DM, and HLD. Presents with progressive SOB and cough. COPD exacerbation. Noted to have elevated troponin.  Assessment & Plan    1. Acute on Chronic respiratory failure 2/2 COPD exacerbation: and now left lung pneumonia: treated with IV steroids, antibiotics. CXR yesterday with left lunh infiltrate and bilateral small pleural effusions. Given nebs and IV lasix with improvement. -- management per primary   2. NSTEMI: hsTn peaked at 3942. Underwent cardiac cath noted above with lesion in the p/mLCx treated  with IVUS/PCI with DESx1. Occluded SVG-PLA/PDA with lesion in the RCA but felt this would likely need atherectomy in the future if intervention needed given heavy calcification (felt to be unchanged from cath in 2016). Placed on DAPT with ASA/Brilinta.  However Brilinta changed to Plavix due to dyspnea.    3. HFrEF: Echo back in 2016 with EF of 35%, this admission 35-40% with global hypokinesis with LVH and G1DD. Severe hypokinesis of the left ventricular, mid-apical inferolateral wall, basal-mid anteroseptal wall and inferoseptal wall.  -- BNP trending up 296>>723>>1133 yesterday. Pt got IV lasix 20mg  x 1 yesterday and Scr pumped from 1.25>>1.4 today. Does not look grossly volume overload. Declined to lower head of bed.  -- has been on Toprol XL PTA, but suspect he may need more cardio-selective BB given his severe COPD>>> now on Bisoprolol -- Hold losartan +/- spiro until stable Scr. Consider Entresto as an outpatient but cost may be prohibitive   4. HLD: on statin, switched to Crestor 20mg  daily -- LDL 49   5. Tobacco use: continues to smoke.   6. DM: metformin PTA, held for cath -- on SSI -- SLGT2 copay is high  7. Confusion: daughter think patient is more confused today. He is A & O x 3 but repeats "how I look". Per primary team   For questions or updates, please contact [place] Please consult [email or web address] for contact info under        SignedLeanor Kail, PA  06/06/2021, 8:04 AM

## 2021-06-06 NOTE — Care Management (Signed)
1729 06-06-21 Rolling Walker delivered to the patients room. [employer] will call daughter for 20% co payment. No further needs from Case Manager at this time.

## 2021-06-06 NOTE — Progress Notes (Signed)
SLP Cancellation Note  Patient Details Name: Wayne Williams MRN: [id number] DOB: Jun 28, 1951   Cancelled treatment:       Reason Eval/Treat Not Completed: Patient's level of consciousness Will return in pm per MD.  Lynann Beaver 06/06/2021, 9:56 AM

## 2021-06-06 NOTE — Plan of Care (Signed)

## 2021-06-06 NOTE — Progress Notes (Signed)
Patient woke up confused this am.  Patient doesn't know where he is at this time, or his last name at this time.  Patient is very tearful at this time.  MD notified.   Donah Driver, RN

## 2021-06-06 NOTE — Progress Notes (Signed)
Patient has been very anxious through out the night.  Patient has gotten out of bed several time MD notified.  Orders given.  Will continue to monitor.   Donah Driver, RN

## 2021-06-06 NOTE — Plan of Care (Signed)
  Problem: Education: Goal: Knowledge of General Education information will improve Description: Including pain rating scale, medication(s)/side effects and non-pharmacologic comfort measures 06/06/2021 1715 by Camillia Herter, RN Outcome: Adequate for Discharge 06/06/2021 0747 by Camillia Herter, RN Outcome: Progressing   Problem: Health Behavior/Discharge Planning: Goal: Ability to manage health-related needs will improve 06/06/2021 1715 by Camillia Herter, RN Outcome: Adequate for Discharge 06/06/2021 0747 by Camillia Herter, RN Outcome: Progressing   Problem: Clinical Measurements: Goal: Ability to maintain clinical measurements within normal limits will improve 06/06/2021 1715 by Camillia Herter, RN Outcome: Adequate for Discharge 06/06/2021 0747 by Camillia Herter, RN Outcome: Progressing Goal: Will remain free from infection 06/06/2021 1715 by Camillia Herter, RN Outcome: Adequate for Discharge 06/06/2021 0747 by Camillia Herter, RN Outcome: Progressing Goal: Diagnostic test results will improve 06/06/2021 1715 by Camillia Herter, RN Outcome: Adequate for Discharge 06/06/2021 0747 by Camillia Herter, RN Outcome: Progressing Goal: Respiratory complications will improve 06/06/2021 1715 by Camillia Herter, RN Outcome: Adequate for Discharge 06/06/2021 0747 by Camillia Herter, RN Outcome: Progressing Goal: Cardiovascular complication will be avoided 06/06/2021 1715 by Camillia Herter, RN Outcome: Adequate for Discharge 06/06/2021 0747 by Camillia Herter, RN Outcome: Progressing   Problem: Activity: Goal: Risk for activity intolerance will decrease 06/06/2021 1715 by Camillia Herter, RN Outcome: Adequate for Discharge 06/06/2021 0747 by Camillia Herter, RN Outcome: Progressing   Problem: Nutrition: Goal: Adequate nutrition will be maintained 06/06/2021 1715 by Camillia Herter, RN Outcome: Adequate for Discharge 06/06/2021 0747 by Camillia Herter, RN Outcome: Progressing   Problem: Coping: Goal: Level of anxiety  will decrease 06/06/2021 1715 by Camillia Herter, RN Outcome: Adequate for Discharge 06/06/2021 0747 by Camillia Herter, RN Outcome: Progressing   Problem: Elimination: Goal: Will not experience complications related to bowel motility 06/06/2021 1715 by Camillia Herter, RN Outcome: Adequate for Discharge 06/06/2021 0747 by Camillia Herter, RN Outcome: Progressing Goal: Will not experience complications related to urinary retention 06/06/2021 1715 by Camillia Herter, RN Outcome: Adequate for Discharge 06/06/2021 0747 by Camillia Herter, RN Outcome: Progressing   Problem: Pain Managment: Goal: General experience of comfort will improve 06/06/2021 1715 by Camillia Herter, RN Outcome: Adequate for Discharge 06/06/2021 0747 by Camillia Herter, RN Outcome: Progressing   Problem: Safety: Goal: Ability to remain free from injury will improve 06/06/2021 1715 by Camillia Herter, RN Outcome: Adequate for Discharge 06/06/2021 0747 by Camillia Herter, RN Outcome: Progressing   Problem: Skin Integrity: Goal: Risk for impaired skin integrity will decrease 06/06/2021 1715 by Camillia Herter, RN Outcome: Adequate for Discharge 06/06/2021 0747 by Camillia Herter, RN Outcome: Progressing

## 2021-06-06 NOTE — Progress Notes (Signed)
Heart Failure Navigator Progress Note  Assessed for Heart & Vascular TOC clinic readiness.  Unfortunately at this time the patient does not meet criteria due to respiratory failure likely due to COPD exacerbation and PNA. Known HFrEF - not grossly volume overloaded on MD exam.   Navigator available for reassessment of patient.   Kerby Nora, PharmD, BCPS Heart Failure Stewardship Pharmacist Phone [phone]

## 2021-06-07 ENCOUNTER — Telehealth: Payer: Self-pay | Admitting: Family Medicine

## 2021-06-07 ENCOUNTER — Telehealth: Payer: Self-pay

## 2021-06-07 NOTE — Telephone Encounter (Signed)
Tell him to take his usual medications over the weekend. Set up a hospital follow up OV with me early next week

## 2021-06-07 NOTE — Telephone Encounter (Signed)
Patient called the [facility] because he just got out of the hospital yesterday afternoon and wanted to contact provider regarding medication called Du. He is not sure if wants it filled. The hospital wanted to change medications around. He refused to change alprazolam to Ambien, lowered dose of alprazolam. Hospital also added medications too.  Please advise

## 2021-06-07 NOTE — Telephone Encounter (Signed)
Spoke with pt scheduled for a Hosp f/u per Dr Sarajane Jews on 06/10/2021 at 4 pm

## 2021-06-07 NOTE — Telephone Encounter (Signed)
Patient was seen in ED on 06/03/21.   Please advise

## 2021-06-10 ENCOUNTER — Ambulatory Visit: Payer: Medicare PPO | Admitting: Family Medicine

## 2021-06-11 ENCOUNTER — Telehealth (HOSPITAL_COMMUNITY): Payer: Self-pay

## 2021-06-11 ENCOUNTER — Other Ambulatory Visit: Payer: Self-pay

## 2021-06-11 NOTE — Telephone Encounter (Signed)
Called pt to see if he interested in the cardiac rehab program once he has been cleared. Advised pt of how the program works, pt stated that he is not able to do it. I advised pt that he can call back if anything changes. Closed referral.

## 2021-06-12 ENCOUNTER — Ambulatory Visit: Payer: Medicare PPO | Admitting: Family Medicine

## 2021-06-12 ENCOUNTER — Encounter: Payer: Self-pay | Admitting: Family Medicine

## 2021-06-12 VITALS — BP 96/58 | HR 76 | Temp 98.1°F | Wt 129.8 lb

## 2021-06-12 DIAGNOSIS — J449 Chronic obstructive pulmonary disease, unspecified: Secondary | ICD-10-CM | POA: Diagnosis not present

## 2021-06-12 DIAGNOSIS — N179 Acute kidney failure, unspecified: Secondary | ICD-10-CM

## 2021-06-12 DIAGNOSIS — E119 Type 2 diabetes mellitus without complications: Secondary | ICD-10-CM

## 2021-06-12 DIAGNOSIS — I214 Non-ST elevation (NSTEMI) myocardial infarction: Secondary | ICD-10-CM | POA: Diagnosis not present

## 2021-06-12 DIAGNOSIS — I10 Essential (primary) hypertension: Secondary | ICD-10-CM | POA: Diagnosis not present

## 2021-06-12 DIAGNOSIS — J189 Pneumonia, unspecified organism: Secondary | ICD-10-CM | POA: Diagnosis not present

## 2021-06-12 DIAGNOSIS — F17210 Nicotine dependence, cigarettes, uncomplicated: Secondary | ICD-10-CM | POA: Diagnosis not present

## 2021-06-12 MED ORDER — ALBUTEROL SULFATE (2.5 MG/3ML) 0.083% IN NEBU: 2.5000 mg | INHALATION_SOLUTION | RESPIRATORY_TRACT | 12 refills | Status: DC | PRN

## 2021-06-12 NOTE — Progress Notes (Signed)
   Subjective:    Patient ID: Wayne Williams, male    DOB: 04/19/1951, 70 y.o.   MRN: [id number]  HPI Here with his daughter [name] to follow up a hospital stay from 06-04-21 to 06-06-21 for a multifocal pneumonia that created a lot of other issues. He presented with coughing, weakness, and SOB and a CXR revealed the pneumonia. His WBC got as high as 19.6 but this was down to 13.8 by DC. He was treated with IV Rocephin and Doxycycline, and he went home with a course of Augmentin. He had an acute exacerbation of COPD and was treated with nebulizers. He had an acute exacerbation of CHF, and his BNP went up to 1133. His ECHO showed an EF of 35-40%. He was diuresed with Lasix. He was diagnosed with a NSTEMI by elevated troponin, and he underwent a cardiac catheterization. He had several areas of stenoses and a drug eluting stent was placed. He was sent home on ASA and Plavix. He had AKI when his creatinine went up to 1.40 with a GFR of 53. He was urged to quit smoking, and he agreed to try Chantix again. He has not picked up this prescription yet. He is still weak, and he gets out of breath by walking across the room. He has an albuterol inhaler and Breo at home. He was prescribed Dulera but this is too expensive.    Review of Systems  Constitutional:  Positive for fatigue. Negative for chills, diaphoresis and fever.  Respiratory:  Positive for cough, shortness of breath and wheezing. Negative for chest tightness.   Cardiovascular:  Positive for leg swelling. Negative for chest pain and palpitations.  Gastrointestinal: Negative.   Genitourinary: Negative.   Neurological:  Positive for weakness.      Objective:   Physical Exam Constitutional:      Comments: Frail, thin   Cardiovascular:     Rate and Rhythm: Normal rate and regular rhythm.     Pulses: Normal pulses.     Heart sounds: Normal heart sounds.  Pulmonary:     Effort: Pulmonary effort is normal.     Breath sounds: Normal breath sounds.   Neurological:     General: No focal deficit present.     Mental Status: He is alert. Mental status is at baseline.          Assessment & Plan:  He is recovering from pneumonia, and he has finished antibiotics. For the COPD, we will cancel the [facility] and we will obtain a nebulizer for him to administer albuterol at home. For the CHF, we will taper him off Prednisone. He will take 1/2 tablet once a day (5 mg) for one week and then stop it. We will try to stay off Lasix if possible due to the renal issue. Check a BMET and a CBC today. I again urged him to quit smoking and he promised to pick up the Chantix and start taking it again. He will follow up with Cardiology in the next several weeks. We spent 35 minutes reviewing his records and discussing these issues.  Alysia Penna, MD

## 2021-06-13 LAB — CBC WITH DIFFERENTIAL/PLATELET
Basophils Absolute: 0.1 10*3/uL (ref 0.0–0.1)
Basophils Relative: 0.7 % (ref 0.0–3.0)
Eosinophils Absolute: 0 10*3/uL (ref 0.0–0.7)
Eosinophils Relative: 0.2 % (ref 0.0–5.0)
HCT: 35.3 % — ABNORMAL LOW (ref 39.0–52.0)
Hemoglobin: 11.9 g/dL — ABNORMAL LOW (ref 13.0–17.0)
Lymphocytes Relative: 13.6 % (ref 12.0–46.0)
Lymphs Abs: 1.5 10*3/uL (ref 0.7–4.0)
MCHC: 33.7 g/dL (ref 30.0–36.0)
MCV: 94.4 fl (ref 78.0–100.0)
Monocytes Absolute: 0.5 10*3/uL (ref 0.1–1.0)
Monocytes Relative: 4.5 % (ref 3.0–12.0)
Neutro Abs: 9.1 10*3/uL — ABNORMAL HIGH (ref 1.4–7.7)
Neutrophils Relative %: 81 % — ABNORMAL HIGH (ref 43.0–77.0)
Platelets: 421 10*3/uL — ABNORMAL HIGH (ref 150.0–400.0)
RBC: 3.74 Mil/uL — ABNORMAL LOW (ref 4.22–5.81)
RDW: 14.6 % (ref 11.5–15.5)
WBC: 11.2 10*3/uL — ABNORMAL HIGH (ref 4.0–10.5)

## 2021-06-13 LAB — BASIC METABOLIC PANEL
BUN: 26 mg/dL — ABNORMAL HIGH (ref 6–23)
CO2: 27 mEq/L (ref 19–32)
Calcium: 9 mg/dL (ref 8.4–10.5)
Chloride: 101 mEq/L (ref 96–112)
Creatinine, Ser: 1.21 mg/dL (ref 0.40–1.50)
GFR: 59.91 mL/min — ABNORMAL LOW (ref >60.00)
Glucose, Bld: 116 mg/dL — ABNORMAL HIGH (ref 70–99)
Potassium: 4.5 mEq/L (ref 3.5–5.1)
Sodium: 137 mEq/L (ref 135–145)

## 2021-06-24 ENCOUNTER — Other Ambulatory Visit: Payer: Self-pay | Admitting: Family Medicine

## 2021-07-02 ENCOUNTER — Emergency Department (HOSPITAL_COMMUNITY): Payer: Medicare PPO

## 2021-07-02 ENCOUNTER — Encounter (HOSPITAL_COMMUNITY): Payer: Self-pay | Admitting: Emergency Medicine

## 2021-07-02 ENCOUNTER — Inpatient Hospital Stay (HOSPITAL_COMMUNITY)
Admission: EM | Admit: 2021-07-02 | Discharge: 2021-07-07 | DRG: 871 | Discharge status: Against Medical Advice | Payer: Medicare PPO | Attending: Internal Medicine | Admitting: Internal Medicine

## 2021-07-02 DIAGNOSIS — I2510 Atherosclerotic heart disease of native coronary artery without angina pectoris: Secondary | ICD-10-CM | POA: Diagnosis present

## 2021-07-02 DIAGNOSIS — R634 Abnormal weight loss: Secondary | ICD-10-CM | POA: Diagnosis present

## 2021-07-02 DIAGNOSIS — Z8249 Family history of ischemic heart disease and other diseases of the circulatory system: Secondary | ICD-10-CM

## 2021-07-02 DIAGNOSIS — R000 Tachycardia, unspecified: Secondary | ICD-10-CM | POA: Diagnosis not present

## 2021-07-02 DIAGNOSIS — J189 Pneumonia, unspecified organism: Secondary | ICD-10-CM | POA: Diagnosis present

## 2021-07-02 DIAGNOSIS — Z951 Presence of aortocoronary bypass graft: Secondary | ICD-10-CM

## 2021-07-02 DIAGNOSIS — R21 Rash and other nonspecific skin eruption: Secondary | ICD-10-CM | POA: Diagnosis present

## 2021-07-02 DIAGNOSIS — Z7951 Long term (current) use of inhaled steroids: Secondary | ICD-10-CM

## 2021-07-02 DIAGNOSIS — N400 Enlarged prostate without lower urinary tract symptoms: Secondary | ICD-10-CM | POA: Diagnosis present

## 2021-07-02 DIAGNOSIS — J449 Chronic obstructive pulmonary disease, unspecified: Secondary | ICD-10-CM | POA: Diagnosis present

## 2021-07-02 DIAGNOSIS — Z681 Body mass index (BMI) 19 or less, adult: Secondary | ICD-10-CM | POA: Diagnosis not present

## 2021-07-02 DIAGNOSIS — R404 Transient alteration of awareness: Secondary | ICD-10-CM | POA: Diagnosis not present

## 2021-07-02 DIAGNOSIS — R6520 Severe sepsis without septic shock: Secondary | ICD-10-CM | POA: Diagnosis not present

## 2021-07-02 DIAGNOSIS — J9601 Acute respiratory failure with hypoxia: Secondary | ICD-10-CM | POA: Diagnosis present

## 2021-07-02 DIAGNOSIS — Z20822 Contact with and (suspected) exposure to covid-19: Secondary | ICD-10-CM | POA: Diagnosis present

## 2021-07-02 DIAGNOSIS — E785 Hyperlipidemia, unspecified: Secondary | ICD-10-CM | POA: Diagnosis present

## 2021-07-02 DIAGNOSIS — E43 Unspecified severe protein-calorie malnutrition: Secondary | ICD-10-CM | POA: Diagnosis not present

## 2021-07-02 DIAGNOSIS — R0602 Shortness of breath: Secondary | ICD-10-CM | POA: Diagnosis not present

## 2021-07-02 DIAGNOSIS — Z7984 Long term (current) use of oral hypoglycemic drugs: Secondary | ICD-10-CM

## 2021-07-02 DIAGNOSIS — E1122 Type 2 diabetes mellitus with diabetic chronic kidney disease: Secondary | ICD-10-CM | POA: Diagnosis present

## 2021-07-02 DIAGNOSIS — I252 Old myocardial infarction: Secondary | ICD-10-CM | POA: Diagnosis not present

## 2021-07-02 DIAGNOSIS — A419 Sepsis, unspecified organism: Secondary | ICD-10-CM | POA: Diagnosis not present

## 2021-07-02 DIAGNOSIS — J441 Chronic obstructive pulmonary disease with (acute) exacerbation: Secondary | ICD-10-CM | POA: Diagnosis present

## 2021-07-02 DIAGNOSIS — J690 Pneumonitis due to inhalation of food and vomit: Secondary | ICD-10-CM | POA: Diagnosis present

## 2021-07-02 DIAGNOSIS — F419 Anxiety disorder, unspecified: Secondary | ICD-10-CM | POA: Diagnosis present

## 2021-07-02 DIAGNOSIS — T380X5A Adverse effect of glucocorticoids and synthetic analogues, initial encounter: Secondary | ICD-10-CM | POA: Diagnosis present

## 2021-07-02 DIAGNOSIS — I5022 Chronic systolic (congestive) heart failure: Secondary | ICD-10-CM | POA: Diagnosis not present

## 2021-07-02 DIAGNOSIS — Z7952 Long term (current) use of systemic steroids: Secondary | ICD-10-CM

## 2021-07-02 DIAGNOSIS — J431 Panlobular emphysema: Secondary | ICD-10-CM | POA: Diagnosis not present

## 2021-07-02 DIAGNOSIS — J440 Chronic obstructive pulmonary disease with acute lower respiratory infection: Secondary | ICD-10-CM | POA: Diagnosis present

## 2021-07-02 DIAGNOSIS — R451 Restlessness and agitation: Secondary | ICD-10-CM | POA: Diagnosis not present

## 2021-07-02 DIAGNOSIS — R410 Disorientation, unspecified: Secondary | ICD-10-CM | POA: Diagnosis not present

## 2021-07-02 DIAGNOSIS — J90 Pleural effusion, not elsewhere classified: Secondary | ICD-10-CM | POA: Diagnosis not present

## 2021-07-02 DIAGNOSIS — Z79899 Other long term (current) drug therapy: Secondary | ICD-10-CM

## 2021-07-02 DIAGNOSIS — K219 Gastro-esophageal reflux disease without esophagitis: Secondary | ICD-10-CM | POA: Diagnosis present

## 2021-07-02 DIAGNOSIS — Z8673 Personal history of transient ischemic attack (TIA), and cerebral infarction without residual deficits: Secondary | ICD-10-CM

## 2021-07-02 DIAGNOSIS — R531 Weakness: Secondary | ICD-10-CM

## 2021-07-02 DIAGNOSIS — I10 Essential (primary) hypertension: Secondary | ICD-10-CM | POA: Diagnosis present

## 2021-07-02 DIAGNOSIS — N1831 Chronic kidney disease, stage 3a: Secondary | ICD-10-CM | POA: Diagnosis present

## 2021-07-02 DIAGNOSIS — E1165 Type 2 diabetes mellitus with hyperglycemia: Secondary | ICD-10-CM | POA: Diagnosis present

## 2021-07-02 DIAGNOSIS — R4182 Altered mental status, unspecified: Secondary | ICD-10-CM | POA: Diagnosis not present

## 2021-07-02 DIAGNOSIS — Z7902 Long term (current) use of antithrombotics/antiplatelets: Secondary | ICD-10-CM

## 2021-07-02 DIAGNOSIS — I130 Hypertensive heart and chronic kidney disease with heart failure and stage 1 through stage 4 chronic kidney disease, or unspecified chronic kidney disease: Secondary | ICD-10-CM | POA: Diagnosis not present

## 2021-07-02 DIAGNOSIS — F17210 Nicotine dependence, cigarettes, uncomplicated: Secondary | ICD-10-CM | POA: Diagnosis present

## 2021-07-02 DIAGNOSIS — R0902 Hypoxemia: Secondary | ICD-10-CM | POA: Diagnosis not present

## 2021-07-02 DIAGNOSIS — W19XXXA Unspecified fall, initial encounter: Secondary | ICD-10-CM

## 2021-07-02 DIAGNOSIS — Z7982 Long term (current) use of aspirin: Secondary | ICD-10-CM

## 2021-07-02 LAB — URINALYSIS, ROUTINE W REFLEX MICROSCOPIC
Bacteria, UA: NONE SEEN
Bilirubin Urine: NEGATIVE
Glucose, UA: NEGATIVE mg/dL
Ketones, ur: 20 mg/dL — AB
Leukocytes,Ua: NEGATIVE
Nitrite: NEGATIVE
Protein, ur: 100 mg/dL — AB
Specific Gravity, Urine: 1.024 (ref 1.005–1.030)
pH: 5 (ref 5.0–8.0)

## 2021-07-02 LAB — COMPREHENSIVE METABOLIC PANEL
ALT: 35 U/L (ref 0–44)
AST: 23 U/L (ref 15–41)
Albumin: 2.6 g/dL — ABNORMAL LOW (ref 3.5–5.0)
Alkaline Phosphatase: 55 U/L (ref 38–126)
Anion gap: 12 (ref 5–15)
BUN: 25 mg/dL — ABNORMAL HIGH (ref 8–23)
CO2: 22 mmol/L (ref 22–32)
Calcium: 8.5 mg/dL — ABNORMAL LOW (ref 8.9–10.3)
Chloride: 100 mmol/L (ref 98–111)
Creatinine, Ser: 1.53 mg/dL — ABNORMAL HIGH (ref 0.61–1.24)
GFR, Estimated: 48 mL/min — ABNORMAL LOW (ref >60)
Glucose, Bld: 119 mg/dL — ABNORMAL HIGH (ref 70–99)
Potassium: 3.9 mmol/L (ref 3.5–5.1)
Sodium: 134 mmol/L — ABNORMAL LOW (ref 135–145)
Total Bilirubin: 1.6 mg/dL — ABNORMAL HIGH (ref 0.3–1.2)
Total Protein: 6 g/dL — ABNORMAL LOW (ref 6.5–8.1)

## 2021-07-02 LAB — RETICULOCYTES
Immature Retic Fract: 16.3 % — ABNORMAL HIGH (ref 2.3–15.9)
RBC.: 3.31 MIL/uL — ABNORMAL LOW (ref 4.22–5.81)
Retic Count, Absolute: 45.7 10*3/uL (ref 19.0–186.0)
Retic Ct Pct: 1.4 % (ref 0.4–3.1)

## 2021-07-02 LAB — CBC WITH DIFFERENTIAL/PLATELET
Abs Immature Granulocytes: 0.07 10*3/uL (ref 0.00–0.07)
Basophils Absolute: 0 10*3/uL (ref 0.0–0.1)
Basophils Relative: 0 %
Eosinophils Absolute: 0 10*3/uL (ref 0.0–0.5)
Eosinophils Relative: 0 %
HCT: 33.2 % — ABNORMAL LOW (ref 39.0–52.0)
Hemoglobin: 10.7 g/dL — ABNORMAL LOW (ref 13.0–17.0)
Immature Granulocytes: 1 %
Lymphocytes Relative: 7 %
Lymphs Abs: 0.9 10*3/uL (ref 0.7–4.0)
MCH: 31.5 pg (ref 26.0–34.0)
MCHC: 32.2 g/dL (ref 30.0–36.0)
MCV: 97.6 fL (ref 80.0–100.0)
Monocytes Absolute: 1 10*3/uL (ref 0.1–1.0)
Monocytes Relative: 8 %
Neutro Abs: 10.4 10*3/uL — ABNORMAL HIGH (ref 1.7–7.7)
Neutrophils Relative %: 84 %
Platelets: 392 10*3/uL (ref 150–400)
RBC: 3.4 MIL/uL — ABNORMAL LOW (ref 4.22–5.81)
RDW: 13.5 % (ref 11.5–15.5)
WBC: 12.3 10*3/uL — ABNORMAL HIGH (ref 4.0–10.5)
nRBC: 0 % (ref 0.0–0.2)

## 2021-07-02 LAB — IRON AND TIBC
Iron: 11 ug/dL — ABNORMAL LOW (ref 45–182)
Saturation Ratios: 4 % — ABNORMAL LOW (ref 17.9–39.5)
TIBC: 253 ug/dL (ref 250–450)
UIBC: 242 ug/dL

## 2021-07-02 LAB — FERRITIN: Ferritin: 70 ng/mL (ref 24–336)

## 2021-07-02 LAB — RESP PANEL BY RT-PCR (FLU A&B, COVID) ARPGX2
Influenza A by PCR: NEGATIVE
Influenza B by PCR: NEGATIVE
SARS Coronavirus 2 by RT PCR: NEGATIVE

## 2021-07-02 LAB — TROPONIN I (HIGH SENSITIVITY)
Troponin I (High Sensitivity): 63 ng/L — ABNORMAL HIGH (ref <18)
Troponin I (High Sensitivity): 66 ng/L — ABNORMAL HIGH (ref <18)

## 2021-07-02 LAB — BRAIN NATRIURETIC PEPTIDE: B Natriuretic Peptide: 460.3 pg/mL — ABNORMAL HIGH (ref 0.0–100.0)

## 2021-07-02 LAB — PREALBUMIN: Prealbumin: 9.1 mg/dL — ABNORMAL LOW (ref 18–38)

## 2021-07-02 MED ORDER — PANTOPRAZOLE SODIUM 20 MG PO TBEC
20.0000 mg | DELAYED_RELEASE_TABLET | Freq: Every day | ORAL | Status: DC
  Administered 2021-07-02 – 2021-07-07 (×5): 20 mg via ORAL
  Filled 2021-07-02 (×5): qty 1

## 2021-07-02 MED ORDER — METOPROLOL SUCCINATE ER 50 MG PO TB24
50.0000 mg | ORAL_TABLET | Freq: Every day | ORAL | Status: DC
  Administered 2021-07-02 – 2021-07-07 (×5): 50 mg via ORAL
  Filled 2021-07-02 (×4): qty 1
  Filled 2021-07-02: qty 2

## 2021-07-02 MED ORDER — ENOXAPARIN SODIUM 30 MG/0.3ML IJ SOSY
30.0000 mg | PREFILLED_SYRINGE | INTRAMUSCULAR | Status: DC
  Administered 2021-07-02 – 2021-07-04 (×3): 30 mg via SUBCUTANEOUS
  Filled 2021-07-02 (×3): qty 0.3

## 2021-07-02 MED ORDER — VARENICLINE TARTRATE 1 MG PO TABS
1.0000 mg | ORAL_TABLET | Freq: Two times a day (BID) | ORAL | Status: DC
  Administered 2021-07-02 – 2021-07-07 (×9): 1 mg via ORAL
  Filled 2021-07-02 (×12): qty 1

## 2021-07-02 MED ORDER — CLOPIDOGREL BISULFATE 75 MG PO TABS
75.0000 mg | ORAL_TABLET | Freq: Every day | ORAL | Status: DC
  Administered 2021-07-02 – 2021-07-07 (×5): 75 mg via ORAL
  Filled 2021-07-02 (×5): qty 1

## 2021-07-02 MED ORDER — FLUTICASONE FUROATE-VILANTEROL 200-25 MCG/INH IN AEPB
1.0000 | INHALATION_SPRAY | Freq: Every day | RESPIRATORY_TRACT | Status: DC
  Administered 2021-07-03 – 2021-07-07 (×3): 1 via RESPIRATORY_TRACT
  Filled 2021-07-02: qty 28

## 2021-07-02 MED ORDER — ROSUVASTATIN CALCIUM 20 MG PO TABS
20.0000 mg | ORAL_TABLET | Freq: Every day | ORAL | Status: DC
  Administered 2021-07-02 – 2021-07-07 (×5): 20 mg via ORAL
  Filled 2021-07-02 (×5): qty 1

## 2021-07-02 MED ORDER — B COMPLEX VITAMINS PO CAPS: 1.0000 | ORAL_CAPSULE | Freq: Every morning | ORAL | Status: DC

## 2021-07-02 MED ORDER — PREDNISONE 20 MG PO TABS
40.0000 mg | ORAL_TABLET | Freq: Every day | ORAL | Status: DC
  Administered 2021-07-02 – 2021-07-03 (×2): 40 mg via ORAL
  Filled 2021-07-02: qty 2
  Filled 2021-07-02: qty 4

## 2021-07-02 MED ORDER — ALPRAZOLAM 0.25 MG PO TABS
0.2500 mg | ORAL_TABLET | Freq: Two times a day (BID) | ORAL | Status: DC | PRN
  Administered 2021-07-02 – 2021-07-03 (×2): 0.25 mg via ORAL
  Filled 2021-07-02 (×2): qty 1

## 2021-07-02 MED ORDER — BENZONATATE 100 MG PO CAPS
100.0000 mg | ORAL_CAPSULE | Freq: Two times a day (BID) | ORAL | Status: DC
  Administered 2021-07-02 – 2021-07-07 (×7): 100 mg via ORAL
  Filled 2021-07-02 (×9): qty 1

## 2021-07-02 MED ORDER — SODIUM CHLORIDE 0.9 % IV SOLN
1.0000 g | Freq: Once | INTRAVENOUS | Status: AC
  Administered 2021-07-02: 1 g via INTRAVENOUS
  Filled 2021-07-02: qty 10

## 2021-07-02 MED ORDER — AZITHROMYCIN 250 MG PO TABS
500.0000 mg | ORAL_TABLET | Freq: Every day | ORAL | Status: DC
  Administered 2021-07-03 – 2021-07-07 (×4): 500 mg via ORAL
  Filled 2021-07-02 (×4): qty 2

## 2021-07-02 MED ORDER — SODIUM CHLORIDE 0.9 % IV SOLN
1.0000 g | INTRAVENOUS | Status: DC
  Administered 2021-07-03 – 2021-07-06 (×4): 1 g via INTRAVENOUS
  Filled 2021-07-02 (×5): qty 10

## 2021-07-02 MED ORDER — ASPIRIN EC 81 MG PO TBEC
81.0000 mg | DELAYED_RELEASE_TABLET | Freq: Every day | ORAL | Status: DC
  Administered 2021-07-03 – 2021-07-05 (×3): 81 mg via ORAL
  Filled 2021-07-02 (×3): qty 1

## 2021-07-02 MED ORDER — TAMSULOSIN HCL 0.4 MG PO CAPS
0.4000 mg | ORAL_CAPSULE | Freq: Every day | ORAL | Status: DC
  Administered 2021-07-02 – 2021-07-07 (×5): 0.4 mg via ORAL
  Filled 2021-07-02 (×5): qty 1

## 2021-07-02 MED ORDER — SODIUM CHLORIDE 0.9 % IV SOLN: 1.0000 g | INTRAVENOUS | Status: DC

## 2021-07-02 MED ORDER — ENSURE ENLIVE PO LIQD
237.0000 mL | Freq: Two times a day (BID) | ORAL | Status: DC
  Administered 2021-07-02 – 2021-07-03 (×3): 237 mL via ORAL
  Filled 2021-07-02: qty 237

## 2021-07-02 MED ORDER — GUAIFENESIN ER 600 MG PO TB12
1200.0000 mg | ORAL_TABLET | Freq: Two times a day (BID) | ORAL | Status: DC
  Administered 2021-07-02 – 2021-07-07 (×9): 1200 mg via ORAL
  Filled 2021-07-02 (×9): qty 2

## 2021-07-02 MED ORDER — SODIUM CHLORIDE 0.9 % IV SOLN
500.0000 mg | Freq: Once | INTRAVENOUS | Status: AC
  Administered 2021-07-02: 500 mg via INTRAVENOUS
  Filled 2021-07-02: qty 500

## 2021-07-02 MED ORDER — ALBUTEROL SULFATE HFA 108 (90 BASE) MCG/ACT IN AERS: 2.0000 | INHALATION_SPRAY | RESPIRATORY_TRACT | Status: DC | PRN

## 2021-07-02 MED ORDER — ALBUTEROL SULFATE (2.5 MG/3ML) 0.083% IN NEBU
2.5000 mg | INHALATION_SOLUTION | RESPIRATORY_TRACT | Status: DC | PRN
  Administered 2021-07-03: 2.5 mg via RESPIRATORY_TRACT
  Filled 2021-07-02: qty 3

## 2021-07-02 MED ORDER — SODIUM CHLORIDE 0.9 % IV BOLUS
500.0000 mL | Freq: Once | INTRAVENOUS | Status: AC
  Administered 2021-07-02: 500 mL via INTRAVENOUS

## 2021-07-02 NOTE — ED Notes (Signed)
EDP at [facility], daughter at [facility] updated.

## 2021-07-02 NOTE — ED Provider Notes (Signed)
Emergency Department Provider Note   I have reviewed the triage vital signs and the nursing notes.   HISTORY  Chief Complaint Weakness   HPI Wayne Williams is a 70 y.o. male with past medical history reviewed below including recent hospitalization with multifocal pneumonia and NSTEMI presents to the ED via EMS from home with generalized weakness and new O2 requirement.  Patient was discharged from the hospital on 06/06/21 and states that he initially felt pretty good.  He has followed with his primary care doctor.  He states that he was advised to undergo cardiac rehab after his recent NSTEMI but states that he felt so weak he did not think he could do it and so did not follow-up.  He lives at home by himself and family had to come over and help him because he was too weak to get off of the toilet this morning.  He is not having chest pain.  He is feeling some mild dyspnea.  Denies abdominal discomfort.  His appetite has been poor.  He is not having diarrhea.  He has been compliant with his home medications.  He states that he typically ambulates with a walker but has been too weak to even do that recently.  EMS report room air oxygen saturation of 88% and blood sugar 134.  He started on 2 L nasal cannula and is tolerating that well.   The patient's daughter arrives to the bedside shortly after the patient got to the department.  She tells me that in addition to the above symptoms she is noticed that he has been having some slurred speech starting several days ago.  She notes that he did have a fall but was able to get back up on his own and so only told her about it later.  Otherwise, no other acute changes.    Past Medical History:  Diagnosis Date   Anxiety    CAD (coronary artery disease)    s/p cabg x 4   Cerebrovascular accident [facility])    hx of 06 20 09   Depression    saw Dr. Casimiro Needle in the past   Diabetes mellitus    ED (erectile dysfunction)    GERD (gastroesophageal reflux  disease)    Hypertension    Insomnia     Patient Active Problem List   Diagnosis Date Noted   Severe sepsis ([place]) 07/03/2021   Community acquired pneumonia 07/02/2021   PNA (pneumonia) 07/02/2021   AKI (acute kidney injury) ([place]) 06/12/2021   Protein-calorie malnutrition, severe 06/05/2021   NSTEMI (non-ST elevated myocardial infarction) ([place]) 06/03/2021   Ankle edema, bilateral 05/21/2021   COPD (chronic obstructive pulmonary disease) ([place]) 04/09/2020   Diabetes mellitus without complication ([place]) 99/24/2683   Cerebral artery occlusion with cerebral infarction ([place]) 08/22/2010   CONSTIPATION 08/19/2010   MICROSCOPIC HEMATURIA 08/19/2010   LOSS OF WEIGHT 08/19/2010   Hyperlipidemia 10/10/2009   BACK PAIN, LUMBAR 05/11/2009   Anxiety state 08/11/2008   Nicotine dependence 08/11/2008   DEPRESSION 08/11/2008   Essential hypertension 08/11/2008   MYOCARDIAL INFARCTION, HX OF 08/11/2008   Coronary atherosclerosis 08/11/2008   GERD 08/11/2008   CEREBROVASCULAR ACCIDENT, HX OF 08/11/2008    Past Surgical History:  Procedure Laterality Date   CERVICAL LAMINECTOMY  1996   per Dr. Hal Neer   CORONARY STENT INTERVENTION N/A 06/04/2021   Procedure: CORONARY STENT INTERVENTION;  Surgeon: Jettie Booze, MD;  Location: [place] [facility];  Service: Cardiovascular;  Laterality: N/A;  Heart bypass  2001   INTRAVASCULAR ULTRASOUND/IVUS N/A 06/04/2021   Procedure: Intravascular Ultrasound/IVUS;  Surgeon: Jettie Booze, MD;  Location: [place] [facility];  Service: Cardiovascular;  Laterality: N/A;   LEFT HEART CATH AND CORS/GRAFTS ANGIOGRAPHY N/A 06/04/2021   Procedure: LEFT HEART CATH AND CORS/GRAFTS ANGIOGRAPHY;  Surgeon: Jettie Booze, MD;  Location: [place] [facility];  Service: Cardiovascular;  Laterality: N/A;   LEFT HEART CATHETERIZATION WITH CORONARY/GRAFT ANGIOGRAM N/A 01/24/2015   Procedure: LEFT HEART CATHETERIZATION WITH Beatrix Fetters;  Surgeon:  Burnell Blanks, MD;  Location: [facility] CATH LAB;  Service: Cardiovascular;  Laterality: N/A;   [place]   per Dr. Gladstone Lighter    Allergies Patient has no known allergies.  Family History  Problem Relation Age of Onset   Coronary artery disease Other    Hypertension Other     Social History Social History   Tobacco Use   Smoking status: Every Day    Packs/day: 1.00    Years: 50.00    Pack years: 50.00    Types: Cigarettes   Smokeless tobacco: Never  Substance Use Topics   Alcohol use: No    Alcohol/week: 0.0 standard drinks   Drug use: No    Review of Systems  Constitutional: No fever/chills. Positive generalized weakness.  Eyes: No visual changes. ENT: No sore throat. Cardiovascular: Denies chest pain. Respiratory: Positive mild shortness of breath. Gastrointestinal: No abdominal pain.  No nausea, no vomiting.  No diarrhea.  No constipation. Genitourinary: Negative for dysuria. Musculoskeletal: Negative for back pain. Skin: Negative for rash. Neurological: Negative for headaches, focal weakness or numbness.  10-point ROS otherwise negative.  ____________________________________________   PHYSICAL EXAM:  VITAL SIGNS: ED Triage Vitals  Enc Vitals Group     BP 07/02/21 1001 125/77     Pulse Rate 07/02/21 1001 (!) 108     Resp 07/02/21 1001 (!) 22     Temp 07/02/21 1001 98.2 F (36.8 C)     Temp Source 07/02/21 1001 Oral     SpO2 07/02/21 1001 100 %     Weight 07/02/21 1003 127 lb 13.9 oz (58 kg)     Height 07/02/21 1003 6' (1.829 m)   Constitutional: Alert and oriented. Well appearing and in no acute distress. Eyes: Conjunctivae are normal.  Head: Atraumatic. Nose: No congestion/rhinnorhea. Mouth/Throat: Mucous membranes are moist.   Neck: No stridor Cardiovascular: Tachycarida. Good peripheral circulation. Grossly normal heart sounds.   Respiratory: Normal respiratory effort.  No retractions. Lungs CTAB. 2L [place] O2 in place.   Gastrointestinal: Soft and nontender. No distention.  Musculoskeletal: No lower extremity tenderness nor edema. No gross deformities of extremities. Neurologic:  Normal speech and language. No gross focal neurologic deficits are appreciated. Generalized weakness noted but nothing focal. No facial asymmetry. Normal sensation.  Skin:  Skin is warm, dry and intact. No rash noted.   ____________________________________________   LABS (all labs ordered are listed, but only abnormal results are displayed)  Labs Reviewed  COMPREHENSIVE METABOLIC PANEL - Abnormal; Notable for the following components:      Result Value   Sodium 134 (*)    Glucose, Bld 119 (*)    BUN 25 (*)    Creatinine, Ser 1.53 (*)    Calcium 8.5 (*)    Total Protein 6.0 (*)    Albumin 2.6 (*)    Total Bilirubin 1.6 (*)    GFR, Estimated 48 (*)    All other components within normal limits  BRAIN NATRIURETIC PEPTIDE - Abnormal; Notable for the following components:   B Natriuretic Peptide 460.3 (*)    All other components within normal limits  CBC WITH DIFFERENTIAL/PLATELET - Abnormal; Notable for the following components:   WBC 12.3 (*)    RBC 3.40 (*)    Hemoglobin 10.7 (*)    HCT 33.2 (*)    Neutro Abs 10.4 (*)    All other components within normal limits  URINALYSIS, ROUTINE W REFLEX MICROSCOPIC - Abnormal; Notable for the following components:   APPearance HAZY (*)    Hgb urine dipstick SMALL (*)    Ketones, ur 20 (*)    Protein, ur 100 (*)    All other components within normal limits  IRON AND TIBC - Abnormal; Notable for the following components:   Iron 11 (*)    Saturation Ratios 4 (*)    All other components within normal limits  RETICULOCYTES - Abnormal; Notable for the following components:   RBC. 3.31 (*)    Immature Retic Fract 16.3 (*)    All other components within normal limits  PREALBUMIN - Abnormal; Notable for the following components:   Prealbumin 9.1 (*)    All other components within  normal limits  CBC WITH DIFFERENTIAL/PLATELET - Abnormal; Notable for the following components:   WBC 12.9 (*)    RBC 3.44 (*)    Hemoglobin 10.6 (*)    HCT 34.1 (*)    Neutro Abs 11.8 (*)    All other components within normal limits  BASIC METABOLIC PANEL - Abnormal; Notable for the following components:   Glucose, Bld 165 (*)    BUN 24 (*)    Creatinine, Ser 1.37 (*)    Calcium 8.3 (*)    GFR, Estimated 55 (*)    All other components within normal limits  LACTIC ACID, PLASMA - Abnormal; Notable for the following components:   Lactic Acid, Venous 2.6 (*)    All other components within normal limits  COMPREHENSIVE METABOLIC PANEL - Abnormal; Notable for the following components:   Glucose, Bld 282 (*)    BUN 40 (*)    Creatinine, Ser 1.43 (*)    Calcium 8.3 (*)    Total Protein 5.5 (*)    Albumin 2.2 (*)    AST 67 (*)    ALT 51 (*)    GFR, Estimated 52 (*)    All other components within normal limits  PHOSPHORUS - Abnormal; Notable for the following components:   Phosphorus 1.9 (*)    All other components within normal limits  CBC WITH DIFFERENTIAL/PLATELET - Abnormal; Notable for the following components:   WBC 13.3 (*)    RBC 3.03 (*)    Hemoglobin 9.5 (*)    HCT 28.9 (*)    Neutro Abs 12.6 (*)    Lymphs Abs 0.5 (*)    All other components within normal limits  LACTIC ACID, PLASMA - Abnormal; Notable for the following components:   Lactic Acid, Venous 3.3 (*)    All other components within normal limits  COMPREHENSIVE METABOLIC PANEL - Abnormal; Notable for the following components:   Glucose, Bld 292 (*)    BUN 36 (*)    Creatinine, Ser 1.45 (*)    Calcium 8.2 (*)    Total Protein 5.4 (*)    Albumin 2.3 (*)    AST 45 (*)    ALT 57 (*)    GFR, Estimated 51 (*)    All other components within  normal limits  CBC WITH DIFFERENTIAL/PLATELET - Abnormal; Notable for the following components:   WBC 14.1 (*)    RBC 2.94 (*)    Hemoglobin 9.2 (*)    HCT 27.7 (*)     Neutro Abs 13.4 (*)    Lymphs Abs 0.4 (*)    Abs Immature Granulocytes 0.11 (*)    All other components within normal limits  COMPREHENSIVE METABOLIC PANEL - Abnormal; Notable for the following components:   Glucose, Bld 145 (*)    BUN 37 (*)    Creatinine, Ser 1.35 (*)    Calcium 8.5 (*)    Total Protein 5.7 (*)    Albumin 2.5 (*)    ALT 62 (*)    GFR, Estimated 56 (*)    All other components within normal limits  PHOSPHORUS - Abnormal; Notable for the following components:   Phosphorus 2.4 (*)    All other components within normal limits  CBC WITH DIFFERENTIAL/PLATELET - Abnormal; Notable for the following components:   WBC 12.8 (*)    RBC 3.06 (*)    Hemoglobin 9.5 (*)    HCT 28.5 (*)    Platelets 426 (*)    Neutro Abs 11.8 (*)    Lymphs Abs 0.4 (*)    Abs Immature Granulocytes 0.14 (*)    All other components within normal limits  GLUCOSE, CAPILLARY - Abnormal; Notable for the following components:   Glucose-Capillary 458 (*)    All other components within normal limits  GLUCOSE, CAPILLARY - Abnormal; Notable for the following components:   Glucose-Capillary 195 (*)    All other components within normal limits  GLUCOSE, CAPILLARY - Abnormal; Notable for the following components:   Glucose-Capillary 229 (*)    All other components within normal limits  GLUCOSE, CAPILLARY - Abnormal; Notable for the following components:   Glucose-Capillary 289 (*)    All other components within normal limits  GLUCOSE, CAPILLARY - Abnormal; Notable for the following components:   Glucose-Capillary 148 (*)    All other components within normal limits  COMPREHENSIVE METABOLIC PANEL - Abnormal; Notable for the following components:   Glucose, Bld 154 (*)    BUN 34 (*)    Creatinine, Ser 1.37 (*)    Calcium 8.7 (*)    Total Protein 5.6 (*)    Albumin 2.6 (*)    ALT 59 (*)    GFR, Estimated 55 (*)    All other components within normal limits  CBC WITH DIFFERENTIAL/PLATELET - Abnormal;  Notable for the following components:   WBC 11.5 (*)    RBC 3.42 (*)    Hemoglobin 10.7 (*)    HCT 32.8 (*)    Platelets 463 (*)    Neutro Abs 9.0 (*)    Abs Immature Granulocytes 0.10 (*)    All other components within normal limits  GLUCOSE, CAPILLARY - Abnormal; Notable for the following components:   Glucose-Capillary 203 (*)    All other components within normal limits  GLUCOSE, CAPILLARY - Abnormal; Notable for the following components:   Glucose-Capillary 160 (*)    All other components within normal limits  GLUCOSE, CAPILLARY - Abnormal; Notable for the following components:   Glucose-Capillary 132 (*)    All other components within normal limits  GLUCOSE, CAPILLARY - Abnormal; Notable for the following components:   Glucose-Capillary 142 (*)    All other components within normal limits  GLUCOSE, CAPILLARY - Abnormal; Notable for the following components:   Glucose-Capillary 334 (*)  All other components within normal limits  TROPONIN I (HIGH SENSITIVITY) - Abnormal; Notable for the following components:   Troponin I (High Sensitivity) 63 (*)    All other components within normal limits  TROPONIN I (HIGH SENSITIVITY) - Abnormal; Notable for the following components:   Troponin I (High Sensitivity) 66 (*)    All other components within normal limits  URINE CULTURE  RESP PANEL BY RT-PCR (FLU A&B, COVID) [id number]  CULTURE, BLOOD (ROUTINE X 2)  CULTURE, BLOOD (ROUTINE X 2)  RESPIRATORY PANEL BY PCR  EXPECTORATED SPUTUM ASSESSMENT W GRAM STAIN, RFLX TO RESP C  FERRITIN  LACTIC ACID, PLASMA  MAGNESIUM  PROCALCITONIN  PROCALCITONIN  LACTIC ACID, PLASMA  MAGNESIUM  PHOSPHORUS  PROCALCITONIN  MAGNESIUM  MAGNESIUM  PHOSPHORUS  COMPREHENSIVE METABOLIC PANEL  MAGNESIUM  PHOSPHORUS  CBC WITH DIFFERENTIAL/PLATELET   ____________________________________________  EKG   EKG Interpretation  Date/Time:  Tuesday July 02 2021 10:00:32 EDT Ventricular Rate:  108 PR  Interval:  135 QRS Duration: 169 QT Interval:  410 QTC Calculation: 550 R Axis:   -35 Text Interpretation: Sinus tachycardia IVCD, consider atypical RBBB Probable left ventricular hypertrophy Abnormal T, consider ischemia, lateral leads Prolonged QT interval Duplicate tracing Confirmed by Davonna Belling [id number]) on 07/03/2021 8:01:22 PM         ____________________________________________  RADIOLOGY  No results found.  ____________________________________________   PROCEDURES  Procedure(s) performed:   Procedures  CRITICAL CARE Performed by: Margette Fast Total critical care time: 35 minutes Critical care time was exclusive of separately billable procedures and treating other patients. Critical care was necessary to treat or prevent imminent or life-threatening deterioration. Critical care was time spent personally by me on the following activities: development of treatment plan with patient and/or surrogate as well as nursing, discussions with consultants, evaluation of patient's response to treatment, examination of patient, obtaining history from patient or surrogate, ordering and performing treatments and interventions, ordering and review of laboratory studies, ordering and review of radiographic studies, pulse oximetry and re-evaluation of patient's condition.  Nanda Quinton, MD Emergency Medicine  ____________________________________________   INITIAL IMPRESSION / ASSESSMENT AND PLAN / ED COURSE  Pertinent labs & imaging results that were available during my care of the patient were reviewed by me and considered in my medical decision making (see chart for details).   Patient presents to the emergency department with mild shortness of breath with new oxygen requirement.  He has had generalized weakness over the past several days.  He was feeling well after his hospitalization approximately 1 month ago.  He completed outpatient antibiotics from that hospitalization has  been weaned off of steroid.  He is compliant with his other medications.  No acute respiratory distress but does have a new oxygen requirement.  No chest pain.  Lower suspicion for pneumonia.  Does have recent CAD history.  EKG appears similar to prior tracings.  Patient does seem somewhat dehydrated with mild tachycardia but will hold for chest x-ray and lab work prior to adding additional fluid.    Discussed patient's case with [facility] to request admission. Patient and family (if present) updated with plan. Care transferred to [facility] service.  I reviewed all nursing notes, vitals, pertinent old records, EKGs, labs, imaging (as available).  ____________________________________________  FINAL CLINICAL IMPRESSION(S) / ED DIAGNOSES  Final diagnoses:  Community acquired pneumonia of left lower lobe of lung  Generalized weakness  Fall, initial encounter     MEDICATIONS GIVEN DURING THIS VISIT:  Medications  aspirin  EC tablet 81 mg (81 mg Oral Not Given 07/06/21 2131)  metoprolol succinate (TOPROL-XL) 24 hr tablet 50 mg (50 mg Oral Given 07/07/21 0926)  rosuvastatin (CRESTOR) tablet 20 mg (20 mg Oral Given 07/07/21 0927)  varenicline (CHANTIX) tablet 1 mg (1 mg Oral Given 07/07/21 0924)  tamsulosin (FLOMAX) capsule 0.4 mg (0.4 mg Oral Given 07/07/21 0929)  clopidogrel (PLAVIX) tablet 75 mg (75 mg Oral Given 07/07/21 0927)  fluticasone furoate-vilanterol (BREO ELLIPTA) 200-25 MCG/INH 1 puff (1 puff Inhalation Given 07/07/21 0737)  guaiFENesin (MUCINEX) 12 hr tablet 1,200 mg (1,200 mg Oral Given 07/07/21 0925)  pantoprazole (PROTONIX) EC tablet 20 mg (20 mg Oral Given 07/07/21 0924)  azithromycin (ZITHROMAX) tablet 500 mg (500 mg Oral Given 07/07/21 0927)  cefTRIAXone (ROCEPHIN) 1 g in sodium chloride 0.9 % 100 mL IVPB (1 g Intravenous Patient Refused/Not Given 07/07/21 1200)  benzonatate (TESSALON) capsule 100 mg (100 mg Oral Given 07/07/21 0924)  feeding supplement (NEPRO CARB STEADY) liquid 237 mL (237 mLs  Oral New Bag/Given 07/07/21 1614)  multivitamin with minerals tablet 1 tablet (1 tablet Oral Given 07/07/21 0926)  ipratropium-albuterol (DUONEB) 0.5-2.5 (3) MG/3ML nebulizer solution 3 mL (3 mLs Nebulization Given 07/07/21 1512)  food thickener (SIMPLYTHICK (NECTAR/LEVEL 2/MILDLY THICK)) 1 packet (has no administration in time range)  enoxaparin (LOVENOX) injection 40 mg (0 mg Subcutaneous Hold 07/06/21 2132)  ALPRAZolam (XANAX) tablet 0.25 mg (0.25 mg Oral Given 07/07/21 0925)  insulin aspart (novoLOG) injection 0-9 Units (7 Units Subcutaneous Given 07/07/21 1614)  ALPRAZolam (XANAX) tablet 1.5 mg (1.5 mg Oral Not Given 07/06/21 2131)  predniSONE (DELTASONE) tablet 50 mg (50 mg Oral Given 07/07/21 0800)  haloperidol lactate (HALDOL) injection 2 mg (0 mg Intravenous Hold 07/06/21 2217)  haloperidol lactate (HALDOL) injection 2 mg (2 mg Intravenous Given 07/07/21 1610)  dronabinol (MARINOL) capsule 5 mg (5 mg Oral Given 07/07/21 1613)  cefTRIAXone (ROCEPHIN) 1 g in sodium chloride 0.9 % 100 mL IVPB (0 g Intravenous Stopped 07/02/21 1244)  azithromycin (ZITHROMAX) 500 mg in sodium chloride 0.9 % 250 mL IVPB (0 mg Intravenous Stopped 07/02/21 1244)  sodium chloride 0.9 % bolus 500 mL (0 mLs Intravenous Stopped 07/02/21 1442)  lactated ringers bolus 1,000 mL (1,000 mLs Intravenous New Bag/Given 07/03/21 2225)  sodium phosphate 30 mmol in dextrose 5 % 250 mL infusion ( Intravenous Infusion Verify 07/04/21 1800)    Note:  This document was prepared using Dragon voice recognition software and may include unintentional dictation errors.  Nanda Quinton, MD, [facility] Emergency Medicine    Cordera Stineman, [facility], MD 07/07/21 [phone]

## 2021-07-02 NOTE — Progress Notes (Signed)
RT attempted to NTS pt, pt refused. RT was able to get pt to cough some secretions up but not much. RT will continue to monitor as needed.

## 2021-07-02 NOTE — ED Notes (Signed)
Able to eat per EDP, given bag meal/snack. Daughter at side.

## 2021-07-02 NOTE — H&P (Addendum)
History and Physical    Wayne Williams NUU:[id number] DOB: August 13, 1951 DOA: 07/02/2021  PCP: Laurey Morale, MD (Confirm with patient/family/NH records and if not entered, this has to be entered at [facility] point of entry) Patient coming from: Home  I have personally briefly reviewed patient's old medical records in [place]  Chief Complaint: Feeling weak, cough.  HPI: Wayne Williams is a 70 y.o. male with medical history significant of CAD status post CABG, and recent non-STEMI and LCx stenting, chronic systolic CHF with LVEF 35 to 40%, COPD with chronic cough, HTN, severe malnutrition, HLD, BPH, presented with increasing weakness, unsteady gait and worsening of cough.  Patient was recently hospitalized for NSTEMI process 4 weeks ago.  For the last 2 to 3 months, patient has had worsening of cough, which family reported as his bronchitis which tend to flareup in the early summer, and has cover him with low-dose steroid for the last 43-month, which completed 2 weeks ago.  Despite, patient continued to have frequent cough, denies any cough or choking after eating, no increasing of cough in the morning.  No trouble swallowing and patient eat regular texture food.  Daughter also reported the patient has been very unsteady for the last 2 weeks, and had few episodes near fall.  Patient reported has had subjective fever at home no chills.  Cough has been dry most occasions.  And recently patient started Chantix.  This morning patient daughter went to patient's home found patient in respiratory distress and check his O2 saturation 88% on room air compared to baseline 91 to 93%.  And daughter called EMS.  At baseline, patient overall condition has deteriorated since last year, significantly decreased appetite, reduced to 1 meal a day, he denies any abdominal pain, no chronic diarrhea but complain about loss of appetite.  Family estimated he lost about 20 to 30 pounds compared to last year.  Patient denied any  history of aspiration but reported that he has had severe acid reflux and used to take multiple antiacid medication including, TUMS but not taking them anymore.  ED Course: Ulceration stabilized on 2 L, still remain tachypneic.  Chest x-ray suspicious for bilateral lower lobe pneumonia left> right.  WBC 12.3.  Creatinine 1.5, potassium 3.9.  Review of Systems: As per HPI otherwise 14 point review of systems negative.    Past Medical History:  Diagnosis Date   Anxiety    CAD (coronary artery disease)    s/p cabg x 4   Cerebrovascular accident [facility])    hx of 06 20 09   Depression    saw Dr. Casimiro Needle in the past   Diabetes mellitus    ED (erectile dysfunction)    GERD (gastroesophageal reflux disease)    Hypertension    Insomnia     Past Surgical History:  Procedure Laterality Date   CERVICAL LAMINECTOMY  1996   per Dr. Hal Neer   CORONARY STENT INTERVENTION N/A 06/04/2021   Procedure: CORONARY STENT INTERVENTION;  Surgeon: Jettie Booze, MD;  Location: [place] [facility];  Service: Cardiovascular;  Laterality: N/A;   Heart bypass  2001   INTRAVASCULAR ULTRASOUND/IVUS N/A 06/04/2021   Procedure: Intravascular Ultrasound/IVUS;  Surgeon: Jettie Booze, MD;  Location: [place] [facility];  Service: Cardiovascular;  Laterality: N/A;   LEFT HEART CATH AND CORS/GRAFTS ANGIOGRAPHY N/A 06/04/2021   Procedure: LEFT HEART CATH AND CORS/GRAFTS ANGIOGRAPHY;  Surgeon: Jettie Booze, MD;  Location: [place] [facility];  Service:  Cardiovascular;  Laterality: N/A;   LEFT HEART CATHETERIZATION WITH CORONARY/GRAFT ANGIOGRAM N/A 01/24/2015   Procedure: LEFT HEART CATHETERIZATION WITH Beatrix Fetters;  Surgeon: Burnell Blanks, MD;  Location: [facility] C[facility];  Service: Cardiovascular;  Laterality: N/A;   [place]   per Dr. Gladstone Lighter     reports that he has been smoking cigarettes. He has a 50.00 pack-year smoking history. He has never used smokeless tobacco. He  reports that he does not drink alcohol and does not use drugs.  No Known Allergies  Family History  Problem Relation Age of Onset   Coronary artery disease Other    Hypertension Other      Prior to Admission medications   Medication Sig Start Date End Date Taking? Authorizing Provider  albuterol (PROVENTIL) (2.5 MG/3ML) 0.083% nebulizer solution Take 3 mLs (2.5 mg total) by nebulization every 4 (four) hours as needed for wheezing or shortness of breath. 06/12/21   Laurey Morale, MD  albuterol (VENTOLIN HFA) 108 (90 Base) MCG/ACT inhaler INHALE 2 PUFFS INTO THE LUNGS EVERY 4 (FOUR) HOURS AS NEEDED FOR WHEEZING OR SHORTNESS OF BREATH. 04/30/21   Laurey Morale, MD  ALPRAZolam Duanne Moron) 0.5 MG tablet TAKE 1 TABLET BY MOUTH EVERY 6 (SIX) HOURS AS NEEDED FOR ANXIETY. 01/04/21   Laurey Morale, MD  amoxicillin-clavulanate (AUGMENTIN) 875-125 MG tablet Take 1 tablet by mouth every 12 (twelve) hours. 06/06/21   Nita Sells, MD  aspirin 81 MG tablet Take 81 mg by mouth at bedtime.    [provider]  B COMPLEX VITAMINS PO Take 1 capsule by mouth every morning.    [provider]  bisoprolol (ZEBETA) 5 MG tablet Take 0.5 tablets (2.5 mg total) by mouth daily. 06/07/21   Nita Sells, MD  BREO ELLIPTA 200-25 MCG/INH AEPB INHALE 1 PUFF INTO THE LUNGS DAILY IN THE AFTERNOON. 03/23/21   Laurey Morale, MD  clopidogrel (PLAVIX) 75 MG tablet TAKE 1 TABLET BY MOUTH EVERY DAY 05/17/21   Laurey Morale, MD  Colchicine (MITIGARE) 0.6 MG CAPS Take 0.6 mg by mouth every 6 (six) hours as needed. 08/24/20   Laurey Morale, MD  guaiFENesin (MUCINEX) 600 MG 12 hr tablet Take 2 tablets (1,200 mg total) by mouth 2 (two) times daily. 06/06/21   Nita Sells, MD  metFORMIN (GLUCOPHAGE) 1000 MG tablet TAKE 1 TABLET BY MOUTH TWICE A DAY Patient taking differently: Take 1,000 mg by mouth in the morning and at bedtime. 12/24/20   Laurey Morale, MD  metoprolol succinate (TOPROL-XL) 50 MG 24 hr  tablet TAKE 1 TABLET BY MOUTH DAILY WITH OR IMMEDIATELY FOLLOWING A MEAL. 12/03/20   Laurey Morale, MD  nitroGLYCERIN (NITROSTAT) 0.4 MG SL tablet Place 1 tablet (0.4 mg total) under the tongue every 5 (five) minutes as needed for chest pain. 08/20/20   Laurey Morale, MD  predniSONE (DELTASONE) 10 MG tablet Take 1 tablet (10 mg total) by mouth daily with breakfast. 04/02/21   Laurey Morale, MD  rosuvastatin (CRESTOR) 20 MG tablet Take 1 tablet (20 mg total) by mouth daily. 06/07/21   Nita Sells, MD  tamsulosin (FLOMAX) 0.4 MG CAPS capsule TAKE 1 CAPSULE BY MOUTH EVERY DAY 03/01/21   Laurey Morale, MD  varenicline (CHANTIX STARTING MONTH PAK) 0.5 MG X 11 & 1 MG X 42 tablet Take one 0.5 mg tablet by mouth once daily for 3 days, then increase to one 0.5 mg tablet twice daily for 4  days, then increase to one 1 mg tablet twice daily. 06/06/21   Nita Sells, MD    Physical Exam: Vitals:   07/02/21 1130 07/02/21 1145 07/02/21 1215 07/02/21 1230  BP: (!) 107/58 (!) 105/58 (!) 96/58 98/61  Pulse: 99 98 97 97  Resp: (!) 31 (!) 33 (!) 31 (!) 31  Temp:      TempSrc:      SpO2: 94% 95% 96% 96%  Weight:      Height:        Constitutional: NAD, calm, comfortable Vitals:   07/02/21 1130 07/02/21 1145 07/02/21 1215 07/02/21 1230  BP: (!) 107/58 (!) 105/58 (!) 96/58 98/61  Pulse: 99 98 97 97  Resp: (!) 31 (!) 33 (!) 31 (!) 31  Temp:      TempSrc:      SpO2: 94% 95% 96% 96%  Weight:      Height:       Eyes: PERRL, lids and conjunctivae normal ENMT: Mucous membranes are moist. Posterior pharynx clear of any exudate or lesions.Normal dentition. Back of the throat appear erythematous and swelling Neck: normal, supple, no masses, no thyromegaly Respiratory: Diminished breathing sound bilaterally, scattered wheezing, coarse crackles left> right.  Increasing respiratory effort. No accessory muscle use.  Cardiovascular: Regular rate and rhythm, no murmurs / rubs / gallops. No extremity  edema. 2+ pedal pulses. No carotid bruits.  Abdomen: no tenderness, no masses palpated. No hepatosplenomegaly. Bowel sounds positive.  Musculoskeletal: no clubbing / cyanosis. No joint deformity upper and lower extremities. Good ROM, no contractures. Normal muscle tone.  Skin: no rashes, lesions, ulcers. No induration Neurologic: CN 2-12 grossly intact. Sensation intact, DTR normal. Strength 5/5 in all 4.  Psychiatric: Normal judgment and insight. Alert and oriented x 3. Normal mood.     Labs on Admission: I have personally reviewed following labs and imaging studies  CBC: Recent Labs  Lab 07/02/21 1115  WBC 12.3*  NEUTROABS 10.4*  HGB 10.7*  HCT 33.2*  MCV 97.6  PLT 956   Basic Metabolic Panel: Recent Labs  Lab 07/02/21 1115  NA 134*  K 3.9  CL 100  CO2 22  GLUCOSE 119*  BUN 25*  CREATININE 1.53*  CALCIUM 8.5*   GFR: Estimated Creatinine Clearance: 35.8 mL/min (A) (by C-G formula based on SCr of 1.53 mg/dL (H)). Liver Function Tests: Recent Labs  Lab 07/02/21 1115  AST 23  ALT 35  ALKPHOS 55  BILITOT 1.6*  PROT 6.0*  ALBUMIN 2.6*   No results for input(s): LIPASE, AMYLASE in the last 168 hours. No results for input(s): AMMONIA in the last 168 hours. Coagulation Profile: No results for input(s): INR, PROTIME in the last 168 hours. Cardiac Enzymes: No results for input(s): CKTOTAL, CKMB, CKMBINDEX, TROPONINI in the last 168 hours. BNP (last 3 results) No results for input(s): PROBNP in the last 8760 hours. HbA1C: No results for input(s): HGBA1C in the last 72 hours. CBG: No results for input(s): GLUCAP in the last 168 hours. Lipid Profile: No results for input(s): CHOL, HDL, LDLCALC, TRIG, CHOLHDL, LDLDIRECT in the last 72 hours. Thyroid Function Tests: No results for input(s): TSH, T4TOTAL, FREET4, T3FREE, THYROIDAB in the last 72 hours. Anemia Panel: No results for input(s): VITAMINB12, FOLATE, FERRITIN, TIBC, IRON, RETICCTPCT in the last 72  hours. Urine analysis:    Component Value Date/Time   COLORURINE YELLOW 09/21/2011 1553   APPEARANCEUR CLEAR 09/21/2011 1553   LABSPEC 1.027 09/21/2011 1553   PHURINE 5.5 09/21/2011 1553  GLUCOSEU >1000 (A) 09/21/2011 1553   HGBUR MODERATE (A) 09/21/2011 1553   BILIRUBINUR 1+ 07/05/2018 1146   KETONESUR 40 (A) 09/21/2011 1553   PROTEINUR Positive (A) 07/05/2018 1146   PROTEINUR 30 (A) 09/21/2011 1553   UROBILINOGEN 0.2 07/05/2018 1146   UROBILINOGEN 0.2 09/21/2011 1553   NITRITE Negative 07/05/2018 1146   NITRITE NEGATIVE 09/21/2011 1553   LEUKOCYTESUR Negative 07/05/2018 1146    Radiological Exams on Admission: CT Head Wo Contrast  Result Date: 07/02/2021 CLINICAL DATA:  Altered mental status. EXAM: CT HEAD WITHOUT CONTRAST TECHNIQUE: Contiguous axial images were obtained from the base of the skull through the vertex without intravenous contrast. COMPARISON:  June 12, 2017. FINDINGS: Brain: Mild chronic ischemic white matter disease is noted. No mass effect or midline shift is noted. Ventricular size is within normal limits. There is no evidence of mass lesion, hemorrhage or acute infarction. Vascular: No hyperdense vessel or unexpected calcification. Skull: Normal. Negative for fracture or focal lesion. Sinuses/Orbits: No acute finding. Other: None. IMPRESSION: No acute intracranial abnormality seen. Electronically Signed   By: Marijo Conception M.D.   On: 07/02/2021 12:24   DG Chest Portable 1 View  Result Date: 07/02/2021 CLINICAL DATA:  Shortness of breath. Weakness and altered mental status for 3 days. EXAM: PORTABLE CHEST 1 VIEW COMPARISON:  06/05/2021 FINDINGS: Patient rotated left. Midline trachea. Normal heart size. Prior median sternotomy. Small left pleural effusion. No pneumothorax. Hyperinflation. Left upper lung airspace disease has resolved. There is new left retrocardiac lower lobe airspace disease and possible developing medial right lower lobe airspace disease. IMPRESSION:  Developing left and possible right lower lobe airspace disease, suspicious for infection or aspiration. Small left pleural effusion. Hyperinflation, consistent with COPD. Electronically Signed   By: Abigail Miyamoto M.D.   On: 07/02/2021 10:47    EKG: Independently reviewed. LBBB chronic, LVH, prolonged QTC  Assessment/Plan Active Problems:   Community acquired pneumonia  (please populate well all problems here in Problem List. (For example, if patient is on BP meds at home and you resume or decide to hold them, it is a problem that needs to be her. Same for CAD, COPD, HLD and so on)  Acute hypoxic respite failure -CAP versus aspiration pneumonia.  No history of aspiration as per patient and his family however, given the location of pneumonia appears to be bilateral on x-ray, and exam shows patient rash on his throat which appears chronic with concern about GERD (which he had but not on any treatment and not following with GI), and particularly he has been on steroid for 2+ months recently without any antiacid measures. -Continue antibiotics ceftriaxone and azithromycin. -Speech evaluation.  Acute COPD exacerbation -Continue home breathing meds, short course of p.o. steroid.  Severe protein calorie malnutrition -About 30 lbs weight loss compared to Jan 2022.  Unintentional -With history of GERD, strongly recommend patient GI and consider endoscopy.  As outpatient.  Patient and daughter at bedside agreed. -Remove all diet restrictions, start regular diet.  Chronic systolic CHF -Euvolemic, stable, no indication for decompensation and monitor off diuresis for now.  HTN -Stable, continue metoprolol.  CAD -Continue aspirin, Plavix and Statin  BPH -Continue Flomax  Smoking cessation -Continue Chantix  Deconditioning -Consult dietitian, PT evaluation  DVT prophylaxis: Lovenox Code Status: Full Code Family Communication: Daughter at bedside Disposition Plan: Expect more than 2 midnight  hospital stay, probably will need SNF Consults called: None Admission status: Tele admit   Lequita Halt MD [facility] Pager  [phone]  07/02/2021, 1:51 PM

## 2021-07-02 NOTE — ED Triage Notes (Signed)
Pt arrives via EMS from home with reports of generalized weakness and AMS for 3 days. Pt lives alone, family came to house and had to assist pt to bathroom, then unable to get pt off of toilet. Denies any falls. Recently hospitalized for PNA and NSTEMI. EMS reports O2 sats on RA 88%, CBG 134

## 2021-07-02 NOTE — ED Notes (Signed)
Paged [facility] admitting for RN

## 2021-07-02 NOTE — ED Notes (Signed)
To CT, remains alert, NAD, calm, interactive, daughter at [facility].

## 2021-07-02 NOTE — ED Notes (Signed)
Admitting MD at [facility]. Daughter at [facility].

## 2021-07-02 NOTE — Social Work (Signed)
EDCSW consulted by RN to speak with daughter in regards to possible care options. Met with Pt and daughter at bedside. CSW gave resources for [employer] as list for area facilities and also gave resources for in-home care.

## 2021-07-03 ENCOUNTER — Inpatient Hospital Stay (HOSPITAL_COMMUNITY): Payer: Medicare PPO

## 2021-07-03 DIAGNOSIS — A419 Sepsis, unspecified organism: Secondary | ICD-10-CM | POA: Diagnosis present

## 2021-07-03 LAB — CBC WITH DIFFERENTIAL/PLATELET
Abs Immature Granulocytes: 0.07 10*3/uL (ref 0.00–0.07)
Basophils Absolute: 0 10*3/uL (ref 0.0–0.1)
Basophils Relative: 0 %
Eosinophils Absolute: 0 10*3/uL (ref 0.0–0.5)
Eosinophils Relative: 0 %
HCT: 34.1 % — ABNORMAL LOW (ref 39.0–52.0)
Hemoglobin: 10.6 g/dL — ABNORMAL LOW (ref 13.0–17.0)
Immature Granulocytes: 1 %
Lymphocytes Relative: 6 %
Lymphs Abs: 0.8 10*3/uL (ref 0.7–4.0)
MCH: 30.8 pg (ref 26.0–34.0)
MCHC: 31.1 g/dL (ref 30.0–36.0)
MCV: 99.1 fL (ref 80.0–100.0)
Monocytes Absolute: 0.2 10*3/uL (ref 0.1–1.0)
Monocytes Relative: 1 %
Neutro Abs: 11.8 10*3/uL — ABNORMAL HIGH (ref 1.7–7.7)
Neutrophils Relative %: 92 %
Platelets: 381 10*3/uL (ref 150–400)
RBC: 3.44 MIL/uL — ABNORMAL LOW (ref 4.22–5.81)
RDW: 13.5 % (ref 11.5–15.5)
WBC: 12.9 10*3/uL — ABNORMAL HIGH (ref 4.0–10.5)
nRBC: 0 % (ref 0.0–0.2)

## 2021-07-03 LAB — URINE CULTURE: Culture: NO GROWTH

## 2021-07-03 LAB — BASIC METABOLIC PANEL
Anion gap: 10 (ref 5–15)
BUN: 24 mg/dL — ABNORMAL HIGH (ref 8–23)
CO2: 23 mmol/L (ref 22–32)
Calcium: 8.3 mg/dL — ABNORMAL LOW (ref 8.9–10.3)
Chloride: 104 mmol/L (ref 98–111)
Creatinine, Ser: 1.37 mg/dL — ABNORMAL HIGH (ref 0.61–1.24)
GFR, Estimated: 55 mL/min — ABNORMAL LOW (ref >60)
Glucose, Bld: 165 mg/dL — ABNORMAL HIGH (ref 70–99)
Potassium: 4.1 mmol/L (ref 3.5–5.1)
Sodium: 137 mmol/L (ref 135–145)

## 2021-07-03 LAB — PROCALCITONIN: Procalcitonin: 0.95 ng/mL

## 2021-07-03 LAB — LACTIC ACID, PLASMA
Lactic Acid, Venous: 2.6 mmol/L (ref 0.5–1.9)
Lactic Acid, Venous: 1.8 mmol/L (ref 0.5–1.9)
Lactic Acid, Venous: 3.3 mmol/L (ref 0.5–1.9)

## 2021-07-03 MED ORDER — NEPRO/CARBSTEADY PO LIQD
237.0000 mL | Freq: Three times a day (TID) | ORAL | Status: DC
  Administered 2021-07-03 – 2021-07-07 (×4): 237 mL via ORAL

## 2021-07-03 MED ORDER — LACTATED RINGERS IV BOLUS
1000.0000 mL | Freq: Once | INTRAVENOUS | Status: AC
  Administered 2021-07-03: 1000 mL via INTRAVENOUS

## 2021-07-03 MED ORDER — ADULT MULTIVITAMIN W/MINERALS CH
1.0000 | ORAL_TABLET | Freq: Every day | ORAL | Status: DC
  Administered 2021-07-03 – 2021-07-07 (×4): 1 via ORAL
  Filled 2021-07-03 (×4): qty 1

## 2021-07-03 MED ORDER — METHYLPREDNISOLONE SODIUM SUCC 125 MG IJ SOLR
60.0000 mg | Freq: Two times a day (BID) | INTRAMUSCULAR | Status: DC
  Administered 2021-07-03 – 2021-07-05 (×5): 60 mg via INTRAVENOUS
  Filled 2021-07-03 (×5): qty 2

## 2021-07-03 MED ORDER — IPRATROPIUM-ALBUTEROL 0.5-2.5 (3) MG/3ML IN SOLN
3.0000 mL | Freq: Four times a day (QID) | RESPIRATORY_TRACT | Status: DC
  Administered 2021-07-03 – 2021-07-07 (×11): 3 mL via RESPIRATORY_TRACT
  Filled 2021-07-03 (×14): qty 3

## 2021-07-03 NOTE — ED Notes (Signed)
Attempted report 

## 2021-07-03 NOTE — Progress Notes (Signed)
Initial Nutrition Assessment  DOCUMENTATION CODES:   Underweight, Severe malnutrition in context of chronic illness  INTERVENTION:   -D/c Ensure Enlive po BID, each supplement provides 350 kcal and 20 grams of protein  -Nepro Shake po TID, each supplement provides 425 kcal and 19 grams protein  -Magic cup TID with meals, each supplement provides 290 kcal and 9 grams of protein  -MVI with minerals daily   NUTRITION DIAGNOSIS:   Severe Malnutrition related to chronic illness (COPD) as evidenced by severe fat depletion, severe muscle depletion, percent weight loss.  GOAL:   Patient will meet greater than or equal to 90% of their needs  MONITOR:   PO intake, Supplement acceptance, Diet advancement, Labs, Weight trends, Skin, I & O's  REASON FOR ASSESSMENT:   Consult Assessment of nutrition requirement/status  ASSESSMENT:   Wayne Williams is a 70 y.o. male with medical history significant of CAD status post CABG, and recent non-STEMI and LCx stenting, chronic systolic CHF with LVEF 35 to 40%, COPD with chronic cough, HTN, severe malnutrition, HLD, BPH, presented with increasing weakness, unsteady gait and worsening of cough.  Pt admitted with acute hypoxic respiratory failure and COPD exacerbation.   7/6- s/p BSE- advanced to regular diet with nectar thick liquids; s/p MBSS- downgraded to dysphagia 2 diet with nectar thick liquids  Reviewed I/O's: +450 ml x 24 hours   UOP: 400 ml x 24 hours  Spoke with pt at bedside, who was pleasant and in good spirits today. He reports that he has already consumed an entire bottle of Ensure, a container of applesauce, and a graham cracker today. Noted breakfast was was untouched; pt reports that he is weary to eat, as "it is almost lunch time and I'm afraid I'll be full".    Pt reports that he was diagnosed with DM approximately one year ago after being hospitalized as a result of a diabetic coma. Per pt, he has lost a significant amount of  weight and had a poor appetite since them ("the biggest change about diabetes is it took away my appetite"). Pt reports that he consumes 1 meal and one snack per day (Breakfast: coffee; Snack: pack of peanut butter crackers; Dinner: sandwich). Pt also shares that he intermittently gets choked and foods and liquids, but does not notice a pattern with frequency or which foods or liquids may cause it.   Reviewed wt hx; pt has experienced a 6.6% wt loss over the past month, which is significant for time frame. Per pt, his UBW is around 175-180#, which he weighed most of his adult life. He estimates that he has lost about 30 pounds over the past 3 months.  Discussed importance of good meal and supplement intake to promote healing. Pt amenable to supplements.   Medications reviewed and include prednisone and chantix.  Labs reviewed.   Labs reviewed.   NUTRITION - FOCUSED PHYSICAL EXAM:  Flowsheet Row Most Recent Value  Orbital Region Severe depletion  Upper Arm Region Severe depletion  Thoracic and Lumbar Region Severe depletion  Buccal Region Severe depletion  Temple Region Severe depletion  Clavicle Bone Region Severe depletion  Clavicle and Acromion Bone Region Severe depletion  Scapular Bone Region Severe depletion  Dorsal Hand Severe depletion  Patellar Region Severe depletion  Anterior Thigh Region Severe depletion  Posterior Calf Region Severe depletion  Edema (RD Assessment) None  Hair Reviewed  Eyes Reviewed  Mouth Reviewed  Skin Reviewed  Nails Reviewed  Diet Order:   Diet Order             DIET DYS 2 Room service appropriate? Yes; Fluid consistency: Nectar Thick  Diet effective now                   EDUCATION NEEDS:   Education needs have been addressed  Skin:  Skin Assessment: Reviewed RN Assessment  Last BM:  Unknown  Height:   Ht Readings from Last 1 Encounters:  07/03/21 6' (1.829 m)    Weight:   Wt Readings from Last 1 Encounters:   07/03/21 55 kg    Ideal Body Weight:  83.6 kg  BMI:  Body mass index is 16.44 kg/m.  Estimated Nutritional Needs:   Kcal:  2200-2400  Protein:  120-135 grams  Fluid:  > 2 L    Loistine Chance, RD, LDN, [place] Registered Dietitian II Certified Diabetes Care and Education Specialist Please refer to [facility] for RD and/or RD on-call/weekend/after hours pager

## 2021-07-03 NOTE — Progress Notes (Signed)
Lab called with lactic acid of 2.6, Dr Sherral Hammers made aware via amion and secure chart.

## 2021-07-03 NOTE — Evaluation (Signed)
Physical Therapy Evaluation Patient Details Name: Wayne Williams MRN: [id number] DOB: Aug 13, 1951 Today's Date: 07/03/2021   History of Present Illness  70 y.o. male presents to [facility] ED on 7//04/2021 with weakness, gait instability, and cough. Pt admitted for management of acute hypoxic respiratory failure and cOPD exacerbation. Chest x-ray suspicious for bilateral lower lobe PNA. PMH includes CAD s/p CABG, NSTEMI, CHF, COPD, HTN, HLD, BPH, severe malnutrition.  Clinical Impression  Pt presents to PT with deficits in strength, power, functional mobility, endurance, cardiopulmonary function. Balance. Pt currently requires physical assistance to perform all out of bed mobility. Pt is generally weak and fatigues quickly, placing him at a high risk for falls. Pt will benefit from acute PT POC to improve mobility quality and balance. PT expresses concern about the patient returning home due to living alone and having limited caregiver support. PT recommends SNF placement at this time, pt seems uncertain about this. PT also suggest the possibility of moving in with daughter temporarily to provide increased assistance. Pt seems to prefer discharging home alone again. PT feels this is an unsafe plan at this time due to high falls risk.    Follow Up Recommendations SNF (HHPT and supervision for out of bed mobility if pt refuses SNF)    Equipment Recommendations  3in1 (PT)    Recommendations for Other Services       Precautions / Restrictions Precautions Precautions: Fall Restrictions Weight Bearing Restrictions: No      Mobility  Bed Mobility Overal bed mobility: Needs Assistance Bed Mobility: Supine to Sit     Supine to sit: Supervision;HOB elevated          Transfers Overall transfer level: Needs assistance Equipment used: Rolling walker (2 wheeled) Transfers: Sit to/from Stand Sit to Stand: Min assist;Min guard         General transfer comment: minA from bed, minG from  recliner  Ambulation/Gait Ambulation/Gait assistance: Min assist Gait Distance (Feet): 20 Feet Assistive device: Rolling walker (2 wheeled) Gait Pattern/deviations: Step-through pattern;Trunk flexed Gait velocity: reduced Gait velocity interpretation: <1.8 ft/sec, indicate of risk for recurrent falls General Gait Details: pt with slowed step-through gait, mild instability with turns  Financial trader Rankin (Stroke Patients Only)       Balance Overall balance assessment: Needs assistance Sitting-balance support: No upper extremity supported;Feet supported Sitting balance-Leahy Scale: Good     Standing balance support: Single extremity supported;Bilateral upper extremity supported Standing balance-Leahy Scale: Poor Standing balance comment: reliant on UE support of RW                             Pertinent Vitals/Pain Pain Assessment: No/denies pain Faces Pain Scale: No hurt    Home Living Family/patient expects to be discharged to:: Private residence Living Arrangements: Alone Available Help at Discharge: Family;Available PRN/intermittently Type of Home: Other(Comment) (condo) Home Access: Level entry     Home Layout: Two level;Able to live on main level with bedroom/bathroom Home Equipment: Gilford Rile - 2 wheels;Cane - single point      Prior Function Level of Independence: Independent with assistive device(s)         Comments: pt reports ambulating with RW since last admission. Still sleeping in recliner and living downstairs exclusively.     Hand Dominance   Dominant Hand: Right    Extremity/Trunk Assessment   Upper Extremity Assessment Upper  Extremity Assessment: Generalized weakness    Lower Extremity Assessment Lower Extremity Assessment: Generalized weakness    Cervical / Trunk Assessment Cervical / Trunk Assessment: Kyphotic  Communication   Communication: No difficulties  Cognition  Arousal/Alertness: Awake/alert Behavior During Therapy: WFL for tasks assessed/performed Overall Cognitive Status: No family/caregiver present to determine baseline cognitive functioning                                 General Comments: pt has some impaired awareness of safety, preferring to discharge home rather than having increased assistance from family or at SNF temporarily      General Comments General comments (skin integrity, edema, etc.): pt on 3L [place] upon arrival, requiring 3L [place] to maintain sats during mobility    Exercises     Assessment/Plan    PT Assessment Patient needs continued PT services  PT Problem List Decreased strength;Decreased activity tolerance;Decreased balance;Decreased mobility;Decreased knowledge of use of DME;Decreased safety awareness;Decreased knowledge of precautions;Cardiopulmonary status limiting activity       PT Treatment Interventions DME instruction;Gait training;Functional mobility training;Therapeutic activities;Therapeutic exercise;Balance training;Patient/family education    PT Goals (Current goals can be found in the Care Plan section)  Acute Rehab PT Goals Patient Stated Goal: to improve strength and return home PT Goal Formulation: With patient Time For Goal Achievement: 07/17/21 Potential to Achieve Goals: Fair    Frequency Min 3X/week (maintain 3x/wk, pt likely to refuse SNF placement)   Barriers to discharge        Co-evaluation               AM-PAC PT "6 Clicks" Mobility  Outcome Measure Help needed turning from your back to your side while in a flat bed without using bedrails?: A Little Help needed moving from lying on your back to sitting on the side of a flat bed without using bedrails?: A Little Help needed moving to and from a bed to a chair (including a wheelchair)?: A Little Help needed standing up from a chair using your arms (e.g., wheelchair or bedside chair)?: A Little Help needed to walk in  hospital room?: A Little Help needed climbing 3-5 steps with a railing? : A Lot 6 Click Score: 17    End of Session Equipment Utilized During Treatment: Oxygen Activity Tolerance: Patient tolerated treatment well Patient left: in chair;with call bell/phone within reach;with chair alarm set Nurse Communication: Mobility status PT Visit Diagnosis: Other abnormalities of gait and mobility (R26.89);Unsteadiness on feet (R26.81);Muscle weakness (generalized) (M62.81);History of falling (Z91.81)    Time: 1610-9604 PT Time Calculation (min) (ACUTE ONLY): 33 min   Charges:   PT Evaluation $PT Eval Low Complexity: 1 Low          Zenaida Niece, PT, DPT Acute Rehabilitation Pager: [phone]   Zenaida Niece 07/03/2021, 10:29 AM

## 2021-07-03 NOTE — ED Notes (Signed)
Report given to [facility]

## 2021-07-03 NOTE — Progress Notes (Signed)
Modified Barium Swallow Progress Note  Patient Details  Name: RENSO SWETT MRN: [id number] Date of Birth: [birth year]  Today's Date: 07/03/2021  Modified Barium Swallow completed.  Full report located under Chart Review in the Imaging Section.  Brief recommendations include the following:  Clinical Impression  Pt has a moderate pharyngeal dysphagia, with limited visibility of oral phase throughout the study due in part to a combination of pt positioning and movement. He does have oral residue that spills back to the valleculae after the swallow though, and he does not fully masticate a bite of graham cracker, sending a bolus with whole pieces in it back into his pharynx. Pharyngeally he has reduced base of tongue retraction, pharyngeal squeeze, epiglottic inversion, and laryngeal vestibule closure. As a result there is incomplete airway protection and pharyngeal clearance. Aspiration is typically during the swallow, especially with thin liquids, and it is not consistently sensed. It is also not reduced with a chin tuck. Residue is present in the valleculae and pyriform sinuses, increasing as boluses become more solid, with the majority of the solid bolus staying in his valleculae. At times, there is penetration after the swallow on this residue, which is aspirated when not cleared. Pt did have penetration of nectar thick liquids - once reaching his vocal folds when trying to clear the solid from his pharynx, but only occurring one additional time that was more readily cleared with a cued throat clear. Recommend Dys 2 (finely chopped) diet and nectar thick liquids, meds crushed in puree. SLP will f/u for tolerance as well as a trial of pharyngeal exercises. Although pt is possibly mroe acutely deconditioned in light of current illness, it is also possible that there may be a more chronic component given his report of other baseline symptoms such as his h/o PNA and weight loss over the past year.    Swallow Evaluation Recommendations       SLP Diet Recommendations: Dysphagia 2 (Fine chop) solids;Nectar thick liquid   Liquid Administration via: Cup;Straw   Medication Administration: Crushed with puree   Supervision: Patient able to self feed;Intermittent supervision to cue for compensatory strategies   Compensations: Slow rate;Small sips/bites;Multiple dry swallows after each bite/sip;Clear throat intermittently   Postural Changes: Seated upright at 90 degrees;Remain semi-upright after after feeds/meals (Comment)   Oral Care Recommendations: Oral care BID        Osie Bond., M.A. [place] Pager [phone] Office [phone]  07/03/2021,2:51 PM

## 2021-07-03 NOTE — Evaluation (Signed)
Clinical/Bedside Swallow Evaluation Patient Details  Name: Wayne Williams MRN: [id number] Date of Birth: 06/12/1951  Today's Date: 07/03/2021 Time: SLP Start Time (ACUTE ONLY): 0847 SLP Stop Time (ACUTE ONLY): 0916 SLP Time Calculation (min) (ACUTE ONLY): 29 min  Past Medical History:  Past Medical History:  Diagnosis Date   Anxiety    CAD (coronary artery disease)    s/p cabg x 4   Cerebrovascular accident ([place])    hx of 06 20 09   Depression    saw Dr. Casimiro Needle in the past   Diabetes mellitus    ED (erectile dysfunction)    GERD (gastroesophageal reflux disease)    Hypertension    Insomnia    Past Surgical History:  Past Surgical History:  Procedure Laterality Date   CERVICAL LAMINECTOMY  1996   per Dr. Hal Neer   CORONARY STENT INTERVENTION N/A 06/04/2021   Procedure: CORONARY STENT INTERVENTION;  Surgeon: Jettie Booze, MD;  Location: [place] [facility];  Service: Cardiovascular;  Laterality: N/A;   Heart bypass  2001   INTRAVASCULAR ULTRASOUND/IVUS N/A 06/04/2021   Procedure: Intravascular Ultrasound/IVUS;  Surgeon: Jettie Booze, MD;  Location: [facility];  Service: Cardiovascular;  Laterality: N/A;   LEFT HEART CATH AND CORS/GRAFTS ANGIOGRAPHY N/A 06/04/2021   Procedure: LEFT HEART CATH AND CORS/GRAFTS ANGIOGRAPHY;  Surgeon: Jettie Booze, MD;  Location: [place] [facility];  Service: Cardiovascular;  Laterality: N/A;   LEFT HEART CATHETERIZATION WITH CORONARY/GRAFT ANGIOGRAM N/A 01/24/2015   Procedure: LEFT HEART CATHETERIZATION WITH Beatrix Fetters;  Surgeon: Burnell Blanks, MD;  Location: [facility];  Service: Cardiovascular;  Laterality: N/A;   [place]   per Dr. Gladstone Lighter   HPI:  Pt is a 70 yo male presenting with weakness and cough, admitted with PNA. Per MD report, family denies any trouble swallowing but does endorse 20-30lb weight loss over the past year and h/o severe acid reflux for which he used to take  Tums. Swallow evaluation in June 2022 appeared relatively functional although with difficulty excluding baseline cough from possible s/s of aspiration. At that time pt/dtr did not want to pursue MBS or any diet modifications. PMH includes: PNA, COPD with chronic cough, severe malnutrition, CAD s/p CABG, recent NSTEMI and LCx stenting, CHF, HTN, HLD, BPH   Assessment / Plan / Recommendation Clinical Impression  Pt has an occasional baseline cough, that is noted during PO intake only after thin liquids or mixed consistencies are consumed. Although difficulty to differentiate from his baseline cough, it may be a significant finding that he is coughing only with thin liquids. Additional risk factors for dysphagia include his weight loss and reports of frequent PNA. Subjectively he describes occasional trouble swallowing due to the condition of his dentition, but he also says that he has "a lot of stuff going down the wrong way." Pt is agreeable to proceeding with MBS this admission when considering all of the above. Will initiate a diet of regular solids and nectar thick liquids until MBS can be completed. This is tentatively scheduled for this afternoon. SLP Visit Diagnosis: Dysphagia, unspecified (R13.10)    Aspiration Risk  Mild aspiration risk;Moderate aspiration risk;Risk for inadequate nutrition/hydration    Diet Recommendation Regular;Nectar-thick liquid   Liquid Administration via: Cup;Straw Medication Administration: Whole meds with puree Supervision: Patient able to self feed;Intermittent supervision to cue for compensatory strategies Compensations: Slow rate;Small sips/bites Postural Changes: Seated upright at 90 degrees;Remain upright for at least 30 minutes after  po intake    Other  Recommendations Oral Care Recommendations: Oral care BID   Follow up Recommendations  (tba)      Frequency and Duration            Prognosis Prognosis for Safe Diet Advancement: Good      Swallow  Study   General HPI: Pt is a 70 yo male presenting with weakness and cough, admitted with PNA. Per MD report, family denies any trouble swallowing but does endorse 20-30lb weight loss over the past year and h/o severe acid reflux for which he used to take Tums. Swallow evaluation in June 2022 appeared relatively functional although with difficulty excluding baseline cough from possible s/s of aspiration. At that time pt/dtr did not want to pursue MBS or any diet modifications. PMH includes: PNA, COPD with chronic cough, severe malnutrition, CAD s/p CABG, recent NSTEMI and LCx stenting, CHF, HTN, HLD, BPH Type of Study: Bedside Swallow Evaluation Previous Swallow Assessment: see HPI Diet Prior to this Study: NPO Temperature Spikes Noted: No Respiratory Status: Nasal cannula History of Recent Intubation: No Behavior/Cognition: Alert;Cooperative;Pleasant mood Oral Cavity Assessment: Within Functional Limits Oral Care Completed by SLP: No Oral Cavity - Dentition: Poor condition;Missing dentition Vision: Functional for self-feeding Self-Feeding Abilities: Able to feed self Patient Positioning: Upright in bed Baseline Vocal Quality: Normal Volitional Cough: Strong;Congested Volitional Swallow: Able to elicit    Oral/Motor/Sensory Function Overall Oral Motor/Sensory Function: Within functional limits   Ice Chips Ice chips: Impaired Presentation: Spoon Pharyngeal Phase Impairments: Cough - Delayed   Thin Liquid Thin Liquid: Impaired Presentation: Cup;Self Fed;Straw Pharyngeal  Phase Impairments: Cough - Immediate    Nectar Thick Nectar Thick Liquid: Within functional limits Presentation: Self Fed;Straw   Honey Thick Honey Thick Liquid: Not tested   Puree Puree: Within functional limits Presentation: Spoon;Self Fed   Solid     Solid: Impaired Presentation: Self Fed Pharyngeal Phase Impairments: Cough - Immediate (when using liquid wash)      Osie Bond., M.A. [place] Pager [phone] Office [phone]  07/03/2021,9:31 AM

## 2021-07-03 NOTE — Progress Notes (Signed)
Received male pt from ER via bed , alert oriented not in distress with O2 [address] [place] at 3 LPM, with IV cannula at left forearm times 2, with condom catheter to bag with yellow urine, skin integrity assessed and verified by RN Caryl Pina, orient pt to room and call bell, advised to call first before going out of bed- pt agreed

## 2021-07-03 NOTE — Progress Notes (Signed)
PROGRESS NOTE    Wayne Williams  NFA:[id number] DOB: 22-Jun-1951 DOA: 07/02/2021 PCP: Laurey Morale, MD     Brief Narrative:  70 y.o. WM PMHx CAD status post CABG, and recent non-STEMI and LCx stenting, chronic systolic CHF with LVEF 35 to 40%, HTN, COPD with chronic cough, severe malnutrition, HLD, BPH,   Presented with increasing weakness, unsteady gait and worsening of cough.   Patient was recently hospitalized for NSTEMI process 4 weeks ago.  For the last 2 to 3 months, patient has had worsening of cough, which family reported as his bronchitis which tend to flareup in the early summer, and has cover him with low-dose steroid for the last 43-month which completed 2 weeks ago.  Despite, patient continued to have frequent cough, denies any cough or choking after eating, no increasing of cough in the morning.  No trouble swallowing and patient eat regular texture food.  Daughter also reported the patient has been very unsteady for the last 2 weeks, and had few episodes near fall.  Patient reported has had subjective fever at home no chills.  Cough has been dry most occasions.  And recently patient started Chantix.  This morning patient daughter went to patient's home found patient in respiratory distress and check his O2 saturation 88% on room air compared to baseline 91 to 93%.  And daughter called EMS.   At baseline, patient overall condition has deteriorated since last year, significantly decreased appetite, reduced to 1 meal a day, he denies any abdominal pain, no chronic diarrhea but complain about loss of appetite.  Family estimated he lost about 20 to 30 pounds compared to last year.  Patient denied any history of aspiration but reported that he has had severe acid reflux and used to take multiple antiacid medication including, TUMS but not taking them anymore.   ED Course: Ulceration stabilized on 2 L, still remain tachypneic.  Chest x-ray suspicious for bilateral lower lobe pneumonia left>  right.  WBC 12.3.  Creatinine 1.5, potassium 3.9.   Review of Systems: As per HPI otherwise 14 point review of systems negative.   Subjective: Afebrile overnight   Assessment & Plan: Covid vaccination; vaccinated 2/3   Active Problems:   Community acquired pneumonia   PNA (pneumonia)  Severe sepsis - On admission patient met criteria for severe sepsis HR> 90, RR> 20, WBC> 12 K, lactic acid> 2 - Most likely site of infection lungs - Trend procalcitonin/lactic acid - Solu-Medrol 60 mg BID -DuoNeb QID -Flutter valve -Incentive spirometry -Normal saline 763mhr  Community acquired pneumonia    Acute respiratory failure with hypoxia -CAP versus aspiration pneumonia.  No history of aspiration as per patient and his family however, given the location of pneumonia appears to be bilateral on x-ray, and exam shows patient rash on his throat which appears chronic with concern about GERD (which he had but not on any treatment and not following with GI), and particularly he has been on steroid for 2+ months recently without any antiacid measures. -Continue antibiotics ceftriaxone and azithromycin. -Speech evaluation.   Acute COPD exacerbation -Continue home breathing meds, short course of p.o. steroid.  Chronic systolic CHF -Euvolemic, stable, no indication for decompensation and monitor off diuresis for now.   HTN -Stable, continue metoprolol.   CAD -Continue aspirin, Plavix and Statin   BPH -Continue Flomax   Severe protein calorie malnutrition -About 30 lbs weight loss compared to Jan 2022.  Unintentional -With history of GERD, strongly recommend patient GI and  consider endoscopy.  As outpatient.  Patient and daughter at bedside agreed. -Remove all diet restrictions, start regular diet.     Smoking cessation -Continue Chantix   Deconditioning -Consult dietitian, PT evaluation         DVT prophylaxis: Lovenox Code Status: Full Family Communication:  Status is:  Inpatient    Dispo: The patient is from:               Anticipated d/c is to: SNF              Anticipated d/c date is:               Patient currently       Consultants:    Procedures/Significant Events:    I have personally reviewed and interpreted all radiology studies and my findings are as above.  VENTILATOR SETTINGS: Nasal cannula 7/6 Flow 3 L/min SPO2 95%   Cultures 7/6 respiratory virus panel pending 7/6 sputum culture pending  Antimicrobials: Anti-infectives (From admission, onward)    Start     Ordered Stop   07/03/21 1100  cefTRIAXone (ROCEPHIN) 1 g in sodium chloride 0.9 % 100 mL IVPB  Status:  Discontinued        07/02/21 1349 07/02/21 1409   07/03/21 1100  cefTRIAXone (ROCEPHIN) 1 g in sodium chloride 0.9 % 100 mL IVPB        07/02/21 1409     07/03/21 1000  azithromycin (ZITHROMAX) tablet 500 mg        07/02/21 1350     07/02/21 1115  cefTRIAXone (ROCEPHIN) 1 g in sodium chloride 0.9 % 100 mL IVPB        07/02/21 1100 07/02/21 1244   07/02/21 1115  azithromycin (ZITHROMAX) 500 mg in sodium chloride 0.9 % 250 mL IVPB        07/02/21 1100 07/02/21 1244         Devices    LINES / TUBES:      Continuous Infusions:  cefTRIAXone (ROCEPHIN)  IV 1 g (07/03/21 1023)     Objective: Vitals:   07/03/21 0805 07/03/21 0815 07/03/21 1004 07/03/21 1158  BP:    (!) 104/56  Pulse:   86 89  Resp:    16  Temp:    97.6 F (36.4 C)  TempSrc:    Oral  SpO2: 99% 99%  95%  Weight:      Height:        Intake/Output Summary (Last 24 hours) at 07/03/2021 1224 Last data filed at 07/03/2021 1000 Gross per 24 hour  Intake 1090 ml  Output 700 ml  Net 390 ml   Filed Weights   07/02/21 1003 07/03/21 0233  Weight: 58 kg 55 kg    Examination:  General: A/O x4, positive acute respiratory distress Eyes: negative scleral hemorrhage, negative anisocoria, negative icterus ENT: Negative Runny nose, negative gingival bleeding, Neck:  Negative scars,  masses, torticollis, lymphadenopathy, JVD Lungs: little to absent breath sounds bilaterally without wheezes or crackles Cardiovascular: Regular rate and rhythm without murmur gallop or rub normal S1 and S2 Abdomen: negative abdominal pain, nondistended, positive soft, bowel sounds, no rebound, no ascites, no appreciable mass Extremities: No significant cyanosis, clubbing, or edema bilateral lower extremities Skin: Negative rashes, lesions, ulcers Psychiatric:  Negative depression, negative anxiety, negative fatigue, negative mania  Central nervous system:  Cranial nerves II through XII intact, tongue/uvula midline, all extremities muscle strength 5/5, sensation intact throughout, finger nose finger bilateral within normal limits, quick  finger touch bilateral within normal limits, negative dysarthria, negative expressive aphasia, negative receptive aphasia. .     Data Reviewed: Care during the described time interval was provided by me .  I have reviewed this patient's available data, including medical history, events of note, physical examination, and all test results as part of my evaluation.  CBC: Recent Labs  Lab 07/02/21 1115 07/03/21 0150  WBC 12.3* 12.9*  NEUTROABS 10.4* 11.8*  HGB 10.7* 10.6*  HCT 33.2* 34.1*  MCV 97.6 99.1  PLT 392 678   Basic Metabolic Panel: Recent Labs  Lab 07/02/21 1115 07/03/21 0150  NA 134* 137  K 3.9 4.1  CL 100 104  CO2 22 23  GLUCOSE 119* 165*  BUN 25* 24*  CREATININE 1.53* 1.37*  CALCIUM 8.5* 8.3*   GFR: Estimated Creatinine Clearance: 37.9 mL/min (A) (by C-G formula based on SCr of 1.37 mg/dL (H)). Liver Function Tests: Recent Labs  Lab 07/02/21 1115  AST 23  ALT 35  ALKPHOS 55  BILITOT 1.6*  PROT 6.0*  ALBUMIN 2.6*   No results for input(s): LIPASE, AMYLASE in the last 168 hours. No results for input(s): AMMONIA in the last 168 hours. Coagulation Profile: No results for input(s): INR, PROTIME in the last 168 hours. Cardiac  Enzymes: No results for input(s): CKTOTAL, CKMB, CKMBINDEX, TROPONINI in the last 168 hours. BNP (last 3 results) No results for input(s): PROBNP in the last 8760 hours. HbA1C: No results for input(s): HGBA1C in the last 72 hours. CBG: No results for input(s): GLUCAP in the last 168 hours. Lipid Profile: No results for input(s): CHOL, HDL, LDLCALC, TRIG, CHOLHDL, LDLDIRECT in the last 72 hours. Thyroid Function Tests: No results for input(s): TSH, T4TOTAL, FREET4, T3FREE, THYROIDAB in the last 72 hours. Anemia Panel: Recent Labs    07/02/21 1115 07/02/21 2155  FERRITIN 70  --   TIBC 253  --   IRON 11*  --   RETICCTPCT  --  1.4   Sepsis Labs: No results for input(s): PROCALCITON, LATICACIDVEN in the last 168 hours.  Recent Results (from the past 240 hour(s))  Resp Panel by RT-PCR (Flu A&B, Covid) Nasopharyngeal Swab     Status: None   Collection Time: 07/02/21 10:21 AM   Specimen: Nasopharyngeal Swab; Nasopharyngeal(NP) swabs in vial transport medium  Result Value Ref Range Status   SARS Coronavirus 2 by RT PCR NEGATIVE NEGATIVE Final    Comment: (NOTE) SARS-CoV-2 target nucleic acids are NOT DETECTED.  The SARS-CoV-2 RNA is generally detectable in upper respiratory specimens during the acute phase of infection. The lowest concentration of SARS-CoV-2 viral copies this assay can detect is 138 copies/mL. A negative result does not preclude SARS-Cov-2 infection and should not be used as the sole basis for treatment or other patient management decisions. A negative result may occur with  improper specimen collection/handling, submission of specimen other than nasopharyngeal swab, presence of viral mutation(s) within the areas targeted by this assay, and inadequate number of viral copies(<138 copies/mL). A negative result must be combined with clinical observations, patient history, and epidemiological information. The expected result is Negative.  Fact Sheet for Patients:   EntrepreneurPulse.com.au  Fact Sheet for Healthcare Providers:  IncredibleEmployment.be  This test is no t yet approved or cleared by the [place] FDA and  has been authorized for detection and/or diagnosis of SARS-CoV-2 by FDA under an Emergency Use Authorization (EUA). This EUA will remain  in effect (meaning this test can be used) for the duration  of the COVID-19 declaration under Section 564(b)(1) of the Act, 21 U.S.C.section 360bbb-3(b)(1), unless the authorization is terminated  or revoked sooner.       Influenza A by PCR NEGATIVE NEGATIVE Final   Influenza B by PCR NEGATIVE NEGATIVE Final    Comment: (NOTE) The Xpert Xpress SARS-CoV-2/FLU/RSV plus assay is intended as an aid in the diagnosis of influenza from Nasopharyngeal swab specimens and should not be used as a sole basis for treatment. Nasal washings and aspirates are unacceptable for Xpert Xpress SARS-CoV-2/FLU/RSV testing.  Fact Sheet for Patients: EntrepreneurPulse.com.au  Fact Sheet for Healthcare Providers: IncredibleEmployment.be  This test is not yet approved or cleared by the [place] FDA and has been authorized for detection and/or diagnosis of SARS-CoV-2 by FDA under an Emergency Use Authorization (EUA). This EUA will remain in effect (meaning this test can be used) for the duration of the COVID-19 declaration under Section 564(b)(1) of the Act, 21 U.S.C. section 360bbb-3(b)(1), unless the authorization is terminated or revoked.  Performed at [facility], [place] [address] [place], [place] [address]   Culture, blood (routine x 2)     Status: None (Preliminary result)   Collection Time: 07/02/21 11:00 AM   Specimen: BLOOD  Result Value Ref Range Status   Specimen Description BLOOD LEFT ANTECUBITAL  Final   Special Requests   Final    BOTTLES DRAWN AEROBIC AND ANAEROBIC Blood Culture adequate volume   Culture    Final    NO GROWTH < 24 HOURS Performed at [facility], [place] [address], [place], [place] [address]    Report Status PENDING  Incomplete  Culture, blood (routine x 2)     Status: None (Preliminary result)   Collection Time: 07/02/21 11:04 AM   Specimen: BLOOD LEFT FOREARM  Result Value Ref Range Status   Specimen Description BLOOD LEFT FOREARM  Final   Special Requests   Final    BOTTLES DRAWN AEROBIC AND ANAEROBIC Blood Culture adequate volume   Culture   Final    NO GROWTH < 24 HOURS Performed at [facility], [place] [address] [place], [place] [address]    Report Status PENDING  Incomplete  Urine culture     Status: None   Collection Time: 07/02/21  1:54 PM   Specimen: Urine, Clean Catch  Result Value Ref Range Status   Specimen Description URINE, CLEAN CATCH  Final   Special Requests NONE  Final   Culture   Final    NO GROWTH Performed at [facility], [place] [address], [place], [place] [address]    Report Status 07/03/2021 FINAL  Final         Radiology Studies: CT Head Wo Contrast  Result Date: 07/02/2021 CLINICAL DATA:  Altered mental status. EXAM: CT HEAD WITHOUT CONTRAST TECHNIQUE: Contiguous axial images were obtained from the base of the skull through the vertex without intravenous contrast. COMPARISON:  June 12, 2017. FINDINGS: Brain: Mild chronic ischemic white matter disease is noted. No mass effect or midline shift is noted. Ventricular size is within normal limits. There is no evidence of mass lesion, hemorrhage or acute infarction. Vascular: No hyperdense vessel or unexpected calcification. Skull: Normal. Negative for fracture or focal lesion. Sinuses/Orbits: No acute finding. Other: None. IMPRESSION: No acute intracranial abnormality seen. Electronically Signed   By: Marijo Conception M.D.   On: 07/02/2021 12:24   DG Chest Portable 1 View  Result Date: 07/02/2021 CLINICAL DATA:  Shortness  of breath. Weakness and altered mental status for 3 days.  EXAM: PORTABLE CHEST 1 VIEW COMPARISON:  06/05/2021 FINDINGS: Patient rotated left. Midline trachea. Normal heart size. Prior median sternotomy. Small left pleural effusion. No pneumothorax. Hyperinflation. Left upper lung airspace disease has resolved. There is new left retrocardiac lower lobe airspace disease and possible developing medial right lower lobe airspace disease. IMPRESSION: Developing left and possible right lower lobe airspace disease, suspicious for infection or aspiration. Small left pleural effusion. Hyperinflation, consistent with COPD. Electronically Signed   By: Abigail Miyamoto M.D.   On: 07/02/2021 10:47        Scheduled Meds:  aspirin EC  81 mg Oral QHS   azithromycin  500 mg Oral Daily   benzonatate  100 mg Oral BID   clopidogrel  75 mg Oral Daily   enoxaparin (LOVENOX) injection  30 mg Subcutaneous Q24H   feeding supplement  237 mL Oral BID BM   fluticasone furoate-vilanterol  1 puff Inhalation Q1500   guaiFENesin  1,200 mg Oral BID   metoprolol succinate  50 mg Oral Daily   pantoprazole  20 mg Oral Daily   predniSONE  40 mg Oral Q breakfast   rosuvastatin  20 mg Oral Daily   tamsulosin  0.4 mg Oral Daily   varenicline  1 mg Oral BID   Continuous Infusions:  cefTRIAXone (ROCEPHIN)  IV 1 g (07/03/21 1023)     LOS: 1 day    Time spent:40 min    Mazie Fencl, Geraldo Docker, MD [facility]   If 7PM-7AM, please contact night-coverage 07/03/2021, 12:24 PM

## 2021-07-03 NOTE — ED Notes (Signed)
Patient SpO2 75% on 4 L, patient stated he could not breath good, attempted to suction patient however SpO2 remained 80s RT called, placed patient on 15L NRB unsuccessful, on call MD paged. RT came patient was NTS and after several attempts patient SpO2 99% MD called back and was made aware of the incident no new orders given.

## 2021-07-03 NOTE — Plan of Care (Signed)

## 2021-07-03 NOTE — Progress Notes (Signed)
Returned call made to [employer], daughter of the pt, updated latest condition, informed that from the time pt came here no complain of pain or anything, v/s was stable, pt asked [place] to let him rest and sleep, and then left in the room comfortably, answered all the inquiry made by the daughter like visiting time, the right time for her to talk with the dr, informed her  that there was no definite time for the dr to come here but she can ask the staff to call the dr in order for her to discuss her concern with regards to the pt, pt satisfied with the answer

## 2021-07-04 DIAGNOSIS — R531 Weakness: Secondary | ICD-10-CM

## 2021-07-04 DIAGNOSIS — J431 Panlobular emphysema: Secondary | ICD-10-CM | POA: Diagnosis not present

## 2021-07-04 DIAGNOSIS — J189 Pneumonia, unspecified organism: Secondary | ICD-10-CM | POA: Diagnosis not present

## 2021-07-04 DIAGNOSIS — A419 Sepsis, unspecified organism: Principal | ICD-10-CM

## 2021-07-04 DIAGNOSIS — I10 Essential (primary) hypertension: Secondary | ICD-10-CM | POA: Diagnosis not present

## 2021-07-04 DIAGNOSIS — I252 Old myocardial infarction: Secondary | ICD-10-CM

## 2021-07-04 DIAGNOSIS — R6520 Severe sepsis without septic shock: Secondary | ICD-10-CM

## 2021-07-04 LAB — CBC WITH DIFFERENTIAL/PLATELET
Abs Immature Granulocytes: 0.07 10*3/uL (ref 0.00–0.07)
Basophils Absolute: 0 10*3/uL (ref 0.0–0.1)
Basophils Relative: 0 %
Eosinophils Absolute: 0 10*3/uL (ref 0.0–0.5)
Eosinophils Relative: 0 %
HCT: 28.9 % — ABNORMAL LOW (ref 39.0–52.0)
Hemoglobin: 9.5 g/dL — ABNORMAL LOW (ref 13.0–17.0)
Immature Granulocytes: 1 %
Lymphocytes Relative: 4 %
Lymphs Abs: 0.5 10*3/uL — ABNORMAL LOW (ref 0.7–4.0)
MCH: 31.4 pg (ref 26.0–34.0)
MCHC: 32.9 g/dL (ref 30.0–36.0)
MCV: 95.4 fL (ref 80.0–100.0)
Monocytes Absolute: 0.2 10*3/uL (ref 0.1–1.0)
Monocytes Relative: 1 %
Neutro Abs: 12.6 10*3/uL — ABNORMAL HIGH (ref 1.7–7.7)
Neutrophils Relative %: 94 %
Platelets: 400 10*3/uL (ref 150–400)
RBC: 3.03 MIL/uL — ABNORMAL LOW (ref 4.22–5.81)
RDW: 13.2 % (ref 11.5–15.5)
WBC: 13.3 10*3/uL — ABNORMAL HIGH (ref 4.0–10.5)
nRBC: 0 % (ref 0.0–0.2)

## 2021-07-04 LAB — COMPREHENSIVE METABOLIC PANEL
ALT: 51 U/L — ABNORMAL HIGH (ref 0–44)
AST: 67 U/L — ABNORMAL HIGH (ref 15–41)
Albumin: 2.2 g/dL — ABNORMAL LOW (ref 3.5–5.0)
Alkaline Phosphatase: 72 U/L (ref 38–126)
Anion gap: 5 (ref 5–15)
BUN: 40 mg/dL — ABNORMAL HIGH (ref 8–23)
CO2: 29 mmol/L (ref 22–32)
Calcium: 8.3 mg/dL — ABNORMAL LOW (ref 8.9–10.3)
Chloride: 102 mmol/L (ref 98–111)
Creatinine, Ser: 1.43 mg/dL — ABNORMAL HIGH (ref 0.61–1.24)
GFR, Estimated: 52 mL/min — ABNORMAL LOW (ref >60)
Glucose, Bld: 282 mg/dL — ABNORMAL HIGH (ref 70–99)
Potassium: 4.3 mmol/L (ref 3.5–5.1)
Sodium: 136 mmol/L (ref 135–145)
Total Bilirubin: 0.6 mg/dL (ref 0.3–1.2)
Total Protein: 5.5 g/dL — ABNORMAL LOW (ref 6.5–8.1)

## 2021-07-04 LAB — PROCALCITONIN: Procalcitonin: 0.79 ng/mL

## 2021-07-04 LAB — LACTIC ACID, PLASMA: Lactic Acid, Venous: 1.8 mmol/L (ref 0.5–1.9)

## 2021-07-04 LAB — MAGNESIUM: Magnesium: 2.2 mg/dL (ref 1.7–2.4)

## 2021-07-04 LAB — PHOSPHORUS: Phosphorus: 1.9 mg/dL — ABNORMAL LOW (ref 2.5–4.6)

## 2021-07-04 MED ORDER — TRAZODONE HCL 50 MG PO TABS
50.0000 mg | ORAL_TABLET | Freq: Every evening | ORAL | Status: DC | PRN
  Administered 2021-07-04: 50 mg via ORAL
  Filled 2021-07-04: qty 1

## 2021-07-04 MED ORDER — FOOD THICKENER (SIMPLYTHICK): 1.0000 | ORAL | Status: DC | PRN

## 2021-07-04 MED ORDER — SODIUM PHOSPHATES 45 MMOLE/15ML IV SOLN
30.0000 mmol | Freq: Once | INTRAVENOUS | Status: AC
  Administered 2021-07-04: 30 mmol via INTRAVENOUS
  Filled 2021-07-04: qty 10

## 2021-07-04 NOTE — Progress Notes (Signed)
  Speech Language Pathology Treatment: Dysphagia  Patient Details Name: Wayne Williams MRN: [id number] DOB: 1951-03-02 Today's Date: 07/04/2021 Time: 0950-1006 SLP Time Calculation (min) (ACUTE ONLY): 16 min  Assessment / Plan / Recommendation Clinical Impression  Pt is frustrated with his diet modifications, particularly the thickened liquids, but he also seems to have reduced awareness of dysphagia and aspiration identified on MBS on previous date. This is not surprising given that sensation of aspiration and residue was inconsistent, so he would not necessarily have cues from his own body when he is having difficulty. He still has a baseline cough which also further complicates clinical assessment, but at least with nectar thick liquids, an intermittent throat clear was effective with protecting the airway. Min cues were needed for use of precautions, with a heavier focus provided today on education about risks and rational for strategies. Would continue to recommend diet and strategies outlined below.    HPI HPI: Pt is a 70 yo male presenting with weakness and cough, admitted with PNA. Per MD report, family denies any trouble swallowing but does endorse 20-30lb weight loss over the past year and h/o severe acid reflux for which he used to take Tums. Swallow evaluation in June 2022 appeared relatively functional although with difficulty excluding baseline cough from possible s/s of aspiration. At that time pt/dtr did not want to pursue MBS or any diet modifications. PMH includes: PNA, COPD with chronic cough, severe malnutrition, CAD s/p CABG, recent NSTEMI and LCx stenting, CHF, HTN, HLD, BPH      SLP Plan  Continue with current plan of care       Recommendations  Diet recommendations: Dysphagia 2 (fine chop);Nectar-thick liquid Liquids provided via: Cup;Straw Medication Administration: Crushed with puree Supervision: Patient able to self feed;Intermittent supervision to cue for  compensatory strategies Compensations: Slow rate;Small sips/bites;Multiple dry swallows after each bite/sip;Clear throat intermittently Postural Changes and/or Swallow Maneuvers: Seated upright 90 degrees;Upright 30-60 min after meal                Oral Care Recommendations: Oral care BID Follow up Recommendations: Skilled Nursing facility SLP Visit Diagnosis: Dysphagia, pharyngeal phase (R13.13) Plan: Continue with current plan of care       GO                Osie Bond., M.A. [facility] Pager [phone] Office [phone]  07/04/2021, 10:42 AM
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
## 2021-07-04 NOTE — [place] FL2 (Signed)
[place] MEDICAID FL2 LEVEL OF CARE SCREENING TOOL     IDENTIFICATION  Patient Name: Wayne Williams Birthdate: 06-Oct-1951 Sex: male Admission Date (Current Location): 07/02/2021  [facility] [place] Number:  Herbalist and Address:  [facility] [place] [place] [address] [place], [place] [address]      Provider Number: [id number]  Attending Physician Name and Address:  Allie Bossier, MD  Relative Name and Phone Number:  Clide, Remmers (Daughter)   [phone]    Current Level of Care: Hospital Recommended Level of Care: [place] Prior Approval Number:    Date Approved/Denied:   PASRR Number:    Discharge Plan: SNF    Current Diagnoses: Patient Active Problem List   Diagnosis Date Noted   Severe sepsis ([place]) 07/03/2021   Community acquired pneumonia 07/02/2021   PNA (pneumonia) 07/02/2021   AKI (acute kidney injury) ([place]) 06/12/2021   Protein-calorie malnutrition, severe 06/05/2021   NSTEMI (non-ST elevated myocardial infarction) ([place]) 06/03/2021   Ankle edema, bilateral 05/21/2021   COPD (chronic obstructive pulmonary disease) ([place]) 04/09/2020   Diabetes mellitus without complication ([place]) 42/68/3419   Cerebral artery occlusion with cerebral infarction ([place]) 08/22/2010   CONSTIPATION 08/19/2010   MICROSCOPIC HEMATURIA 08/19/2010   LOSS OF WEIGHT 08/19/2010   Hyperlipidemia 10/10/2009   BACK PAIN, LUMBAR 05/11/2009   Anxiety state 08/11/2008   Nicotine dependence 08/11/2008   DEPRESSION 08/11/2008   Essential hypertension 08/11/2008   MYOCARDIAL INFARCTION, HX OF 08/11/2008   Coronary atherosclerosis 08/11/2008   GERD 08/11/2008   CEREBROVASCULAR ACCIDENT, HX OF 08/11/2008    Orientation RESPIRATION BLADDER Height & Weight     Self, Time, Situation, Place  O2 (3L Nasal Cannula) External catheter, Continent Weight: 127 lb 6.8 oz (57.8 kg) Height:  6' (182.9 cm)  BEHAVIORAL SYMPTOMS/MOOD NEUROLOGICAL BOWEL NUTRITION  STATUS      Continent Diet (See DC Summary)  AMBULATORY STATUS COMMUNICATION OF NEEDS Skin   Limited Assist Verbally Normal                       Personal Care Assistance Level of Assistance  Bathing, Feeding, Dressing Bathing Assistance: Limited assistance Feeding assistance: Independent Dressing Assistance: Limited assistance     Functional Limitations Info  Sight, Speech, Hearing Sight Info: Adequate Hearing Info: Adequate Speech Info: Adequate    SPECIAL CARE FACTORS FREQUENCY  PT (By licensed PT), OT (By licensed OT)     PT Frequency: 5x a week OT Frequency: 5x a week            Contractures Contractures Info: Not present    Additional Factors Info  Code Status, Allergies Code Status Info: Full Allergies Info: NKA           Current Medications (07/04/2021):  This is the current hospital active medication list Current Facility-Administered Medications  Medication Dose Route Frequency Provider Last Rate Last Admin   ALPRAZolam Duanne Moron) tablet 0.25 mg  0.25 mg Oral BID PRN Wynetta Fines T, MD   0.25 mg at 07/03/21 2121   aspirin EC tablet 81 mg  81 mg Oral QHS Lequita Halt, MD   81 mg at 07/03/21 2043   azithromycin (ZITHROMAX) tablet 500 mg  500 mg Oral Daily Wynetta Fines T, MD   500 mg at 07/04/21 0805   benzonatate (TESSALON) capsule 100 mg  100 mg Oral BID Wynetta Fines T, MD   100 mg at 07/04/21 0808   cefTRIAXone (ROCEPHIN) 1  g in sodium chloride 0.9 % 100 mL IVPB  1 g Intravenous Q24H Long, Wonda Olds, MD   Stopped at 07/03/21 1053   clopidogrel (PLAVIX) tablet 75 mg  75 mg Oral Daily Wynetta Fines T, MD   75 mg at 07/04/21 0807   enoxaparin (LOVENOX) injection 30 mg  30 mg Subcutaneous Q24H Wynetta Fines T, MD   30 mg at 07/03/21 2043   feeding supplement (NEPRO CARB STEADY) liquid 237 mL  237 mL Oral TID BM Allie Bossier, MD   237 mL at 07/04/21 0817   fluticasone furoate-vilanterol (BREO ELLIPTA) 200-25 MCG/INH 1 puff  1 puff Inhalation Q1500 Wynetta Fines T,  MD   1 puff at 07/03/21 0805   food thickener (SIMPLYTHICK (NECTAR/LEVEL 2/MILDLY THICK)) 1 packet  1 packet Oral PRN Allie Bossier, MD       guaiFENesin (MUCINEX) 12 hr tablet 1,200 mg  1,200 mg Oral BID Wynetta Fines T, MD   1,200 mg at 07/04/21 0806   ipratropium-albuterol (DUONEB) 0.5-2.5 (3) MG/3ML nebulizer solution 3 mL  3 mL Nebulization QID Allie Bossier, MD   3 mL at 07/03/21 2103   methylPREDNISolone sodium succinate (SOLU-MEDROL) 125 mg/2 mL injection 60 mg  60 mg Intravenous Q12H Allie Bossier, MD   60 mg at 07/04/21 9937   metoprolol succinate (TOPROL-XL) 24 hr tablet 50 mg  50 mg Oral Daily Wynetta Fines T, MD   50 mg at 07/04/21 1696   multivitamin with minerals tablet 1 tablet  1 tablet Oral Daily Allie Bossier, MD   1 tablet at 07/04/21 0806   pantoprazole (PROTONIX) EC tablet 20 mg  20 mg Oral Daily Wynetta Fines T, MD   20 mg at 07/04/21 7893   rosuvastatin (CRESTOR) tablet 20 mg  20 mg Oral Daily Wynetta Fines T, MD   20 mg at 07/04/21 8101   tamsulosin (FLOMAX) capsule 0.4 mg  0.4 mg Oral Daily Wynetta Fines T, MD   0.4 mg at 07/04/21 7510   varenicline (CHANTIX) tablet 1 mg  1 mg Oral BID Lequita Halt, MD   1 mg at 07/03/21 2121     Discharge Medications: Please see discharge summary for a list of discharge medications.  Relevant Imaging Results:  Relevant Lab Results:   Additional Information SSN: [id number]; [place] COVID-19 Vaccine 03/20/2020 , 02/23/2020  Reece Agar, LCSWA

## 2021-07-04 NOTE — Consult Note (Signed)
   [facility] Inpatient Consult   07/04/2021  Wayne Williams August 16, 1951 [id number]  [facility] [ACO] Patient: [name]  Patient screened for hospitalization with noted high risk score for unplanned readmission risk and to assess for potential [facility] Management service needs for post hospital transition.  Review of patient's medical record reveals patient is being recommended for a skilled nursing facility level of care verse home health with 24 supervision if refusing SNF noted.  Plan:  Continue to follow progress and disposition to assess for post hospital care management needs.    For questions contact:   Natividad Brood, RN BSN [facility] Liaison  [phone] business mobile phone Toll free office [phone]  Fax number: [phone] [email or web address]

## 2021-07-04 NOTE — Progress Notes (Signed)
MD notified via amion of critical venous lactic acid value of 3.3. Orders received to give pt 1L LR bolus.

## 2021-07-05 DIAGNOSIS — A419 Sepsis, unspecified organism: Secondary | ICD-10-CM | POA: Diagnosis not present

## 2021-07-05 DIAGNOSIS — J189 Pneumonia, unspecified organism: Secondary | ICD-10-CM | POA: Diagnosis not present

## 2021-07-05 DIAGNOSIS — I10 Essential (primary) hypertension: Secondary | ICD-10-CM | POA: Diagnosis not present

## 2021-07-05 DIAGNOSIS — J431 Panlobular emphysema: Secondary | ICD-10-CM | POA: Diagnosis not present

## 2021-07-05 LAB — CBC WITH DIFFERENTIAL/PLATELET
Abs Immature Granulocytes: 0.11 10*3/uL — ABNORMAL HIGH (ref 0.00–0.07)
Basophils Absolute: 0 10*3/uL (ref 0.0–0.1)
Basophils Relative: 0 %
Eosinophils Absolute: 0 10*3/uL (ref 0.0–0.5)
Eosinophils Relative: 0 %
HCT: 27.7 % — ABNORMAL LOW (ref 39.0–52.0)
Hemoglobin: 9.2 g/dL — ABNORMAL LOW (ref 13.0–17.0)
Immature Granulocytes: 1 %
Lymphocytes Relative: 3 %
Lymphs Abs: 0.4 10*3/uL — ABNORMAL LOW (ref 0.7–4.0)
MCH: 31.3 pg (ref 26.0–34.0)
MCHC: 33.2 g/dL (ref 30.0–36.0)
MCV: 94.2 fL (ref 80.0–100.0)
Monocytes Absolute: 0.2 10*3/uL (ref 0.1–1.0)
Monocytes Relative: 2 %
Neutro Abs: 13.4 10*3/uL — ABNORMAL HIGH (ref 1.7–7.7)
Neutrophils Relative %: 94 %
Platelets: 363 10*3/uL (ref 150–400)
RBC: 2.94 MIL/uL — ABNORMAL LOW (ref 4.22–5.81)
RDW: 13.1 % (ref 11.5–15.5)
WBC: 14.1 10*3/uL — ABNORMAL HIGH (ref 4.0–10.5)
nRBC: 0 % (ref 0.0–0.2)

## 2021-07-05 LAB — PHOSPHORUS: Phosphorus: 3.3 mg/dL (ref 2.5–4.6)

## 2021-07-05 LAB — COMPREHENSIVE METABOLIC PANEL
ALT: 57 U/L — ABNORMAL HIGH (ref 0–44)
AST: 45 U/L — ABNORMAL HIGH (ref 15–41)
Albumin: 2.3 g/dL — ABNORMAL LOW (ref 3.5–5.0)
Alkaline Phosphatase: 60 U/L (ref 38–126)
Anion gap: 10 (ref 5–15)
BUN: 36 mg/dL — ABNORMAL HIGH (ref 8–23)
CO2: 24 mmol/L (ref 22–32)
Calcium: 8.2 mg/dL — ABNORMAL LOW (ref 8.9–10.3)
Chloride: 102 mmol/L (ref 98–111)
Creatinine, Ser: 1.45 mg/dL — ABNORMAL HIGH (ref 0.61–1.24)
GFR, Estimated: 51 mL/min — ABNORMAL LOW (ref >60)
Glucose, Bld: 292 mg/dL — ABNORMAL HIGH (ref 70–99)
Potassium: 3.9 mmol/L (ref 3.5–5.1)
Sodium: 136 mmol/L (ref 135–145)
Total Bilirubin: 0.5 mg/dL (ref 0.3–1.2)
Total Protein: 5.4 g/dL — ABNORMAL LOW (ref 6.5–8.1)

## 2021-07-05 LAB — GLUCOSE, CAPILLARY
Glucose-Capillary: 458 mg/dL — ABNORMAL HIGH (ref 70–99)
Glucose-Capillary: 195 mg/dL — ABNORMAL HIGH (ref 70–99)

## 2021-07-05 LAB — PROCALCITONIN: Procalcitonin: 0.44 ng/mL

## 2021-07-05 LAB — MAGNESIUM: Magnesium: 1.9 mg/dL (ref 1.7–2.4)

## 2021-07-05 MED ORDER — ALPRAZOLAM 0.25 MG PO TABS
0.2500 mg | ORAL_TABLET | Freq: Every day | ORAL | Status: DC | PRN
  Administered 2021-07-06 – 2021-07-07 (×2): 0.25 mg via ORAL
  Filled 2021-07-05 (×3): qty 1

## 2021-07-05 MED ORDER — TRAZODONE HCL 100 MG PO TABS
100.0000 mg | ORAL_TABLET | Freq: Every evening | ORAL | Status: DC | PRN
  Administered 2021-07-05: 100 mg via ORAL
  Filled 2021-07-05: qty 1

## 2021-07-05 MED ORDER — INSULIN ASPART 100 UNIT/ML IJ SOLN
0.0000 [IU] | INTRAMUSCULAR | Status: DC
  Administered 2021-07-05: 2 [IU] via SUBCUTANEOUS
  Administered 2021-07-05: 9 [IU] via SUBCUTANEOUS
  Administered 2021-07-06: 1 [IU] via SUBCUTANEOUS
  Administered 2021-07-06: 3 [IU] via SUBCUTANEOUS
  Administered 2021-07-06: 5 [IU] via SUBCUTANEOUS
  Administered 2021-07-06: 3 [IU] via SUBCUTANEOUS
  Administered 2021-07-07: 7 [IU] via SUBCUTANEOUS
  Administered 2021-07-07 (×2): 2 [IU] via SUBCUTANEOUS
  Administered 2021-07-07: 1 [IU] via SUBCUTANEOUS

## 2021-07-05 MED ORDER — ENOXAPARIN SODIUM 40 MG/0.4ML IJ SOSY
40.0000 mg | PREFILLED_SYRINGE | INTRAMUSCULAR | Status: DC
  Administered 2021-07-05 – 2021-07-06 (×2): 40 mg via SUBCUTANEOUS
  Filled 2021-07-05 (×2): qty 0.4

## 2021-07-05 NOTE — Care Management Important Message (Signed)
Important Message  Patient Details  Name: Wayne Williams MRN: [id number] Date of Birth: 08-12-1951   Medicare Important Message Given:  Yes     Shelda Altes 07/05/2021, 10:47 AM

## 2021-07-05 NOTE — Progress Notes (Signed)
PROGRESS NOTE    Wayne Williams  NTI:[id number] DOB: February 12, 1951 DOA: 07/02/2021 PCP: Laurey Morale, MD     Brief Narrative:  70 y.o. WM PMHx CAD status post CABG, and recent non-STEMI and LCx stenting, chronic systolic CHF with LVEF 35 to 40%, HTN, COPD with chronic cough, severe malnutrition, HLD, BPH,   Presented with increasing weakness, unsteady gait and worsening of cough.   Patient was recently hospitalized for NSTEMI process 4 weeks ago.  For the last 2 to 3 months, patient has had worsening of cough, which family reported as his bronchitis which tend to flareup in the early summer, and has cover him with low-dose steroid for the last 67-month which completed 2 weeks ago.  Despite, patient continued to have frequent cough, denies any cough or choking after eating, no increasing of cough in the morning.  No trouble swallowing and patient eat regular texture food.  Daughter also reported the patient has been very unsteady for the last 2 weeks, and had few episodes near fall.  Patient reported has had subjective fever at home no chills.  Cough has been dry most occasions.  And recently patient started Chantix.  This morning patient daughter went to patient's home found patient in respiratory distress and check his O2 saturation 88% on room air compared to baseline 91 to 93%.  And daughter called EMS.   At baseline, patient overall condition has deteriorated since last year, significantly decreased appetite, reduced to 1 meal a day, he denies any abdominal pain, no chronic diarrhea but complain about loss of appetite.  Family estimated he lost about 20 to 30 pounds compared to last year.  Patient denied any history of aspiration but reported that he has had severe acid reflux and used to take multiple antiacid medication including, TUMS but not taking them anymore.   ED Course: Ulceration stabilized on 2 L, still remain tachypneic.  Chest x-ray suspicious for bilateral lower lobe pneumonia left>  right.  WBC 12.3.  Creatinine 1.5, potassium 3.9.   Review of Systems: As per HPI otherwise 14 point review of systems negative.   Subjective: 7/8 afebrile overnight, A/O x4, patient looks much more comfortable.  States his breathing much more comfortably.  States significant positive productive cough after using flutter valve.  States still did not sleep well last night   Assessment & Plan: Covid vaccination; vaccinated 2/3   Principal Problem:   Severe sepsis ([place] Active Problems:   Essential hypertension   MYOCARDIAL INFARCTION, HX OF   COPD (chronic obstructive pulmonary disease) ([place]   Community acquired pneumonia   PNA (pneumonia)  Severe sepsis - On admission patient met criteria for severe sepsis HR> 90, RR> 20, WBC> 12 K, lactic acid> 2 - Most likely site of infection lungs - Trend procalcitonin/lactic acid Results for BPERCELL, LAMBOY(MRN [id number] as of 07/04/2021 16:03  Ref. Range 07/03/2021 13:20 07/03/2021 14:25 07/03/2021 17:25 07/03/2021 20:25 07/04/2021 03:48  Lactic Acid, Venous Latest Ref Range: 0.5 - 1.9 mmol/L  2.6 (HH) 1.8 3.3 (HH) 1.8  Procalcitonin Latest Units: ng/mL    0.95 0.79  - Solu-Medrol 60 mg BID -DuoNeb QID -Flutter valve -Incentive spirometry -Normal saline 735mhr  Community acquired pneumonia -See severe sepsis   Acute respiratory failure with hypoxia -CAP versus aspiration pneumonia.  No history of aspiration as per patient and his family however, given the location of pneumonia appears to be bilateral on x-ray, and exam shows patient rash on his throat which appears chronic  with concern about GERD (which he had but not on any treatment and not following with GI), and particularly he has been on steroid for 2+ months recently without any antiacid measures. -Continue antibiotics ceftriaxone and azithromycin. -Speech evaluation.   Acute COPD exacerbation -See severe sepsis  Chronic systolic CHF -Euvolemic, stable, no indication for  decompensation and monitor off diuresis for now.   HTN -Stable, continue metoprolol.   CAD -Continue aspirin, Plavix and Statin   BPH -Continue Flomax   Severe protein calorie malnutrition -About 30 lbs weight loss compared to Jan 2022.  Unintentional -With history of GERD, strongly recommend patient GI and consider endoscopy.  As outpatient.  Patient and daughter at bedside agreed. -Remove all diet restrictions, start regular diet.     Smoking cessation -Continue Chantix   Deconditioning -Consult dietitian, PT evaluation  Hypophosphatemia - Phosphorus goal> 2.5  Hyperglycemia - Secondary to steroid use - 7/8 sensitive SSI          DVT prophylaxis: Lovenox Code Status: Full Family Communication:  Status is: Inpatient    Dispo: The patient is from: Home              Anticipated d/c is to: SNF              Anticipated d/c date is: 7/14              Patient currently unstable      Consultants:    Procedures/Significant Events:    I have personally reviewed and interpreted all radiology studies and my findings are as above.  VENTILATOR SETTINGS: Nasal cannula 7/8 Flow 3 L/min SPO2 99%   Cultures 7/6 respiratory virus panel pending 7/6 sputum culture pending  Antimicrobials: Anti-infectives (From admission, onward)    Start     Ordered Stop   07/03/21 1100  cefTRIAXone (ROCEPHIN) 1 g in sodium chloride 0.9 % 100 mL IVPB  Status:  Discontinued        07/02/21 1349 07/02/21 1409   07/03/21 1100  cefTRIAXone (ROCEPHIN) 1 g in sodium chloride 0.9 % 100 mL IVPB        07/02/21 1409     07/03/21 1000  azithromycin (ZITHROMAX) tablet 500 mg        07/02/21 1350     07/02/21 1115  cefTRIAXone (ROCEPHIN) 1 g in sodium chloride 0.9 % 100 mL IVPB        07/02/21 1100 07/02/21 1244   07/02/21 1115  azithromycin (ZITHROMAX) 500 mg in sodium chloride 0.9 % 250 mL IVPB        07/02/21 1100 07/02/21 1244         Devices    LINES / TUBES:       Continuous Infusions:  cefTRIAXone (ROCEPHIN)  IV 1 g (07/05/21 1200)     Objective: Vitals:   07/05/21 0724 07/05/21 1120 07/05/21 1317 07/05/21 1628  BP:  120/65    Pulse: 83 85 82 83  Resp: _0 Temp:  (!) 97.3 F (36.3 C)    TempSrc:  Oral    SpO2: 98% 98% 98% 99%  Weight:      Height:        Intake/Output Summary (Last 24 hours) at 07/05/2021 1737 Last data filed at 07/05/2021 1301 Gross per 24 hour  Intake 1360.61 ml  Output 1521 ml  Net -160.39 ml    Filed Weights   07/03/21 0233 07/04/21 0417 07/05/21 0442  Weight: 55 kg 57.8 kg 58.2  kg    Examination:  General: A/O x4, positive acute respiratory distress Eyes: negative scleral hemorrhage, negative anisocoria, negative icterus ENT: Negative Runny nose, negative gingival bleeding, Neck:  Negative scars, masses, torticollis, lymphadenopathy, JVD Lungs: clear to auscultation bilaterally without wheezes, rhonchi or crackles Cardiovascular: Regular rate and rhythm without murmur gallop or rub normal S1 and S2 Abdomen: negative abdominal pain, nondistended, positive soft, bowel sounds, no rebound, no ascites, no appreciable mass Extremities: No significant cyanosis, clubbing, or edema bilateral lower extremities Skin: Negative rashes, lesions, ulcers Psychiatric:  Negative depression, negative anxiety, negative fatigue, negative mania  Central nervous system:  Cranial nerves II through XII intact, tongue/uvula midline, all extremities muscle strength 5/5, sensation intact throughout, finger nose finger bilateral within normal limits, quick finger touch bilateral within normal limits, negative dysarthria, negative expressive aphasia, negative receptive aphasia. .     Data Reviewed: Care during the described time interval was provided by me .  I have reviewed this patient's available data, including medical history, events of note, physical examination, and all test results as part of my  evaluation.  CBC: Recent Labs  Lab 07/02/21 1115 07/03/21 0150 07/04/21 0348 07/05/21 0528  WBC 12.3* 12.9* 13.3* 14.1*  NEUTROABS 10.4* 11.8* 12.6* 13.4*  HGB 10.7* 10.6* 9.5* 9.2*  HCT 33.2* 34.1* 28.9* 27.7*  MCV 97.6 99.1 95.4 94.2  PLT 392 381 400 494    Basic Metabolic Panel: Recent Labs  Lab 07/02/21 1115 07/03/21 0150 07/04/21 0348 07/05/21 0528  NA 134* 137 136 136  K 3.9 4.1 4.3 3.9  CL 100 104 102 102  CO2 _0 GLUCOSE 119* 165* 282* 292*  BUN 25* 24* 40* 36*  CREATININE 1.53* 1.37* 1.43* 1.45*  CALCIUM 8.5* 8.3* 8.3* 8.2*  MG  --   --  2.2 1.9  PHOS  --   --  1.9* 3.3    GFR: Estimated Creatinine Clearance: 37.9 mL/min (A) (by C-G formula based on SCr of 1.45 mg/dL (H)). Liver Function Tests: Recent Labs  Lab 07/02/21 1115 07/04/21 0348 07/05/21 0528  AST 23 67* 45*  ALT 35 51* 57*  ALKPHOS 55 72 60  BILITOT 1.6* 0.6 0.5  PROT 6.0* 5.5* 5.4*  ALBUMIN 2.6* 2.2* 2.3*    No results for input(s): LIPASE, AMYLASE in the last 168 hours. No results for input(s): AMMONIA in the last 168 hours. Coagulation Profile: No results for input(s): INR, PROTIME in the last 168 hours. Cardiac Enzymes: No results for input(s): CKTOTAL, CKMB, CKMBINDEX, TROPONINI in the last 168 hours. BNP (last 3 results) No results for input(s): PROBNP in the last 8760 hours. HbA1C: No results for input(s): HGBA1C in the last 72 hours. CBG: No results for input(s): GLUCAP in the last 168 hours. Lipid Profile: No results for input(s): CHOL, HDL, LDLCALC, TRIG, CHOLHDL, LDLDIRECT in the last 72 hours. Thyroid Function Tests: No results for input(s): TSH, T4TOTAL, FREET4, T3FREE, THYROIDAB in the last 72 hours. Anemia Panel: Recent Labs    07/02/21 2155  RETICCTPCT 1.4    Sepsis Labs: Recent Labs  Lab 07/03/21 1425 07/03/21 1725 07/03/21 2025 07/04/21 0348 07/05/21 0528  PROCALCITON  --   --  0.95 0.79 0.44  LATICACIDVEN 2.6* 1.8 3.3* 1.8  --       Recent Results (from the past 240 hour(s))  Resp Panel by RT-PCR (Flu A&B, Covid) Nasopharyngeal Swab     Status: None   Collection Time: 07/02/21 10:21 AM   Specimen: Nasopharyngeal Swab;  Nasopharyngeal(NP) swabs in vial transport medium  Result Value Ref Range Status   SARS Coronavirus 2 by RT PCR NEGATIVE NEGATIVE Final    Comment: (NOTE) SARS-CoV-2 target nucleic acids are NOT DETECTED.  The SARS-CoV-2 RNA is generally detectable in upper respiratory specimens during the acute phase of infection. The lowest concentration of SARS-CoV-2 viral copies this assay can detect is 138 copies/mL. A negative result does not preclude SARS-Cov-2 infection and should not be used as the sole basis for treatment or other patient management decisions. A negative result may occur with  improper specimen collection/handling, submission of specimen other than nasopharyngeal swab, presence of viral mutation(s) within the areas targeted by this assay, and inadequate number of viral copies(<138 copies/mL). A negative result must be combined with clinical observations, patient history, and epidemiological information. The expected result is Negative.  Fact Sheet for Patients:  EntrepreneurPulse.com.au  Fact Sheet for Healthcare Providers:  IncredibleEmployment.be  This test is no t yet approved or cleared by the [place] FDA and  has been authorized for detection and/or diagnosis of SARS-CoV-2 by FDA under an Emergency Use Authorization (EUA). This EUA will remain  in effect (meaning this test can be used) for the duration of the COVID-19 declaration under Section 564(b)(1) of the Act, 21 U.S.C.section 360bbb-3(b)(1), unless the authorization is terminated  or revoked sooner.       Influenza A by PCR NEGATIVE NEGATIVE Final   Influenza B by PCR NEGATIVE NEGATIVE Final    Comment: (NOTE) The Xpert Xpress SARS-CoV-2/FLU/RSV plus assay is intended as  an aid in the diagnosis of influenza from Nasopharyngeal swab specimens and should not be used as a sole basis for treatment. Nasal washings and aspirates are unacceptable for Xpert Xpress SARS-CoV-2/FLU/RSV testing.  Fact Sheet for Patients: EntrepreneurPulse.com.au  Fact Sheet for Healthcare Providers: IncredibleEmployment.be  This test is not yet approved or cleared by the [place] FDA and has been authorized for detection and/or diagnosis of SARS-CoV-2 by FDA under an Emergency Use Authorization (EUA). This EUA will remain in effect (meaning this test can be used) for the duration of the COVID-19 declaration under Section 564(b)(1) of the Act, 21 U.S.C. section 360bbb-3(b)(1), unless the authorization is terminated or revoked.  Performed at [facility], [place] [address] [place], [place] [address]   Culture, blood (routine x 2)     Status: None (Preliminary result)   Collection Time: 07/02/21 11:00 AM   Specimen: BLOOD  Result Value Ref Range Status   Specimen Description BLOOD LEFT ANTECUBITAL  Final   Special Requests   Final    BOTTLES DRAWN AEROBIC AND ANAEROBIC Blood Culture adequate volume   Culture   Final    NO GROWTH 3 DAYS Performed at [facility], [place] [address], [place], [place] [address]    Report Status PENDING  Incomplete  Culture, blood (routine x 2)     Status: None (Preliminary result)   Collection Time: 07/02/21 11:04 AM   Specimen: BLOOD LEFT FOREARM  Result Value Ref Range Status   Specimen Description BLOOD LEFT FOREARM  Final   Special Requests   Final    BOTTLES DRAWN AEROBIC AND ANAEROBIC Blood Culture adequate volume   Culture   Final    NO GROWTH 3 DAYS Performed at [facility], [place] [address], [place], [place] [address]    Report Status PENDING  Incomplete  Urine culture     Status: None   Collection Time: 07/02/21  1:54 PM   Specimen: Urine, Clean Catch  Result Value Ref Range  Status   Specimen Description URINE, CLEAN CATCH  Final   Special Requests NONE  Final   Culture   Final    NO GROWTH Performed at [facility], [address] [place], [place] [address]    Report Status 07/03/2021 FINAL  Final          Radiology Studies: No results found.      Scheduled Meds:  aspirin EC  81 mg Oral QHS   azithromycin  500 mg Oral Daily   benzonatate  100 mg Oral BID   clopidogrel  75 mg Oral Daily   enoxaparin (LOVENOX) injection  40 mg Subcutaneous Q24H   feeding supplement (NEPRO CARB STEADY)  237 mL Oral TID BM   fluticasone furoate-vilanterol  1 puff Inhalation Q1500   guaiFENesin  1,200 mg Oral BID   ipratropium-albuterol  3 mL Nebulization QID   methylPREDNISolone (SOLU-MEDROL) injection  60 mg Intravenous Q12H   metoprolol succinate  50 mg Oral Daily   multivitamin with minerals  1 tablet Oral Daily   pantoprazole  20 mg Oral Daily   rosuvastatin  20 mg Oral Daily   tamsulosin  0.4 mg Oral Daily   varenicline  1 mg Oral BID   Continuous Infusions:  cefTRIAXone (ROCEPHIN)  IV 1 g (07/05/21 1200)     LOS: 3 days    Time spent:40 min    Sandee Bernath, Geraldo Docker, MD [facility]   If 7PM-7AM, please contact night-coverage 07/05/2021, 5:37 PM

## 2021-07-05 NOTE — Progress Notes (Signed)
Physical Therapy Treatment Patient Details Name: Wayne Williams MRN: [id number] DOB: November 07, 1951 Today's Date: 07/05/2021    History of Present Illness 70 y.o. male presents to [facility] ED on 7//04/2021 with weakness, gait instability, and cough. Pt admitted for management of acute hypoxic respiratory failure and cOPD exacerbation. Chest x-ray suspicious for bilateral lower lobe PNA. PMH includes CAD s/p CABG, NSTEMI, CHF, COPD, HTN, HLD, BPH, severe malnutrition.    PT Comments    Pt is recommended to SNF from PT based on his progression of gait with a struggle to set limits of tolerance with SOB and safety awareness.  He is in attendance of daughter who is very supportive of his work, encouraging to not refuse therapy today.  Pt is expected to go home, but will encourage him to consider rehab.  Follow for acute PT goals, managed with O2 on 3L today and demonstrated sats in 90+% range with gait.    Follow Up Recommendations  SNF     Equipment Recommendations  3in1 (PT)    Recommendations for Other Services       Precautions / Restrictions Precautions Precautions: Fall Restrictions Weight Bearing Restrictions: No    Mobility  Bed Mobility Overal bed mobility: Needs Assistance Bed Mobility: Supine to Sit     Supine to sit: Min guard     General bed mobility comments: up to side of bed with minor assist mainly of lines    Transfers Overall transfer level: Needs assistance Equipment used: Rolling walker (2 wheeled) Transfers: Sit to/from Stand Sit to Stand: Min guard         General transfer comment: cued hand placement and assisted pt to set limits of standing  Ambulation/Gait Ambulation/Gait assistance: Min guard;Min assist Gait Distance (Feet): 120 Feet Assistive device: Rolling walker (2 wheeled) Gait Pattern/deviations: Step-through pattern;Trunk flexed;Decreased stride length (forward leaning increased with walk time) Gait velocity: reduced Gait velocity  interpretation: <1.8 ft/sec, indicate of risk for recurrent falls General Gait Details: became more unsteady as he tired   Marine scientist [place] (Stroke Patients Only)       Balance Overall balance assessment: Needs assistance Sitting-balance support: Feet supported Sitting balance-Leahy Scale: Good     Standing balance support: Bilateral upper extremity supported;During functional activity Standing balance-Leahy Scale: Poor                              Cognition Arousal/Alertness: Awake/alert Behavior During Therapy: WFL for tasks assessed/performed Overall Cognitive Status: Within Functional Limits for tasks assessed (for mobility instructions)                                        Exercises      General Comments General comments (skin integrity, edema, etc.): continues on with help on hallway but is struggling with his tolerance to walk      Pertinent Vitals/Pain Pain Assessment: Faces Faces Pain Scale: No hurt    Home Living                      Prior Function            PT Goals (current goals can now be found in the care plan section) Acute Rehab PT Goals Patient Stated Goal:  to get home Progress towards PT goals: Progressing toward goals    Frequency    Min 3X/week      PT Plan Current plan remains appropriate    Co-evaluation              AM-PAC PT "6 Clicks" Mobility   Outcome Measure  Help needed turning from your back to your side while in a flat bed without using bedrails?: A Little Help needed moving from lying on your back to sitting on the side of a flat bed without using bedrails?: A Little Help needed moving to and from a bed to a chair (including a wheelchair)?: A Little Help needed standing up from a chair using your arms (e.g., wheelchair or bedside chair)?: A Little Help needed to walk in hospital room?: A Little Help needed climbing 3-5  steps with a railing? : A Lot 6 Click Score: 17    End of Session Equipment Utilized During Treatment: Gait belt;Oxygen Activity Tolerance: Patient limited by fatigue;Treatment limited secondary to medical complications (Comment) Patient left: in chair;with call bell/phone within reach;with chair alarm set Nurse Communication: Mobility status PT Visit Diagnosis: Other abnormalities of gait and mobility (R26.89);Unsteadiness on feet (R26.81);Muscle weakness (generalized) (M62.81);History of falling (Z91.81)     Time: 9784-7841 PT Time Calculation (min) (ACUTE ONLY): 28 min  Charges:  $Gait Training: 8-22 mins $Therapeutic Activity: 8-22 mins                 Ramond Dial 07/05/2021, 4:06 PM  Mee Hives, PT MS Acute Rehab Dept. Number: [place] and [place]

## 2021-07-05 NOTE — TOC Progression Note (Signed)
Transition of Care [facility]) - Progression Note    Patient Details  Name: Wayne Williams MRN: [id number] Date of Birth: 09-18-1951  Transition of Care [facility]) CM/SW Contact  Reece Agar, [place] Phone Number: 07/05/2021, 1:36 PM  Clinical Narrative:    5397: CSW spoke with pt about SNF at DC, pt stated he does not want to go to any facility and he is ready to leave the hospital. Pt is agreeable to [facility], CSW provided information to NCM who will follow up with [facility] needs. CSW signing off.        Expected Discharge Plan and Services                                                 Social Determinants of Health (SDOH) Interventions    Readmission Risk Interventions No flowsheet data found.

## 2021-07-05 NOTE — Plan of Care (Signed)
  Problem: Pain Managment: Goal: General experience of comfort will improve Outcome: Completed/Met

## 2021-07-06 DIAGNOSIS — I10 Essential (primary) hypertension: Secondary | ICD-10-CM | POA: Diagnosis not present

## 2021-07-06 DIAGNOSIS — J189 Pneumonia, unspecified organism: Secondary | ICD-10-CM | POA: Diagnosis not present

## 2021-07-06 DIAGNOSIS — A419 Sepsis, unspecified organism: Secondary | ICD-10-CM | POA: Diagnosis not present

## 2021-07-06 DIAGNOSIS — J431 Panlobular emphysema: Secondary | ICD-10-CM | POA: Diagnosis not present

## 2021-07-06 LAB — CBC WITH DIFFERENTIAL/PLATELET
Abs Immature Granulocytes: 0.14 10*3/uL — ABNORMAL HIGH (ref 0.00–0.07)
Basophils Absolute: 0 10*3/uL (ref 0.0–0.1)
Basophils Relative: 0 %
Eosinophils Absolute: 0 10*3/uL (ref 0.0–0.5)
Eosinophils Relative: 0 %
HCT: 28.5 % — ABNORMAL LOW (ref 39.0–52.0)
Hemoglobin: 9.5 g/dL — ABNORMAL LOW (ref 13.0–17.0)
Immature Granulocytes: 1 %
Lymphocytes Relative: 3 %
Lymphs Abs: 0.4 10*3/uL — ABNORMAL LOW (ref 0.7–4.0)
MCH: 31 pg (ref 26.0–34.0)
MCHC: 33.3 g/dL (ref 30.0–36.0)
MCV: 93.1 fL (ref 80.0–100.0)
Monocytes Absolute: 0.4 10*3/uL (ref 0.1–1.0)
Monocytes Relative: 3 %
Neutro Abs: 11.8 10*3/uL — ABNORMAL HIGH (ref 1.7–7.7)
Neutrophils Relative %: 93 %
Platelets: 426 10*3/uL — ABNORMAL HIGH (ref 150–400)
RBC: 3.06 MIL/uL — ABNORMAL LOW (ref 4.22–5.81)
RDW: 13.2 % (ref 11.5–15.5)
WBC: 12.8 10*3/uL — ABNORMAL HIGH (ref 4.0–10.5)
nRBC: 0 % (ref 0.0–0.2)

## 2021-07-06 LAB — GLUCOSE, CAPILLARY
Glucose-Capillary: 229 mg/dL — ABNORMAL HIGH (ref 70–99)
Glucose-Capillary: 289 mg/dL — ABNORMAL HIGH (ref 70–99)
Glucose-Capillary: 148 mg/dL — ABNORMAL HIGH (ref 70–99)
Glucose-Capillary: 203 mg/dL — ABNORMAL HIGH (ref 70–99)

## 2021-07-06 LAB — COMPREHENSIVE METABOLIC PANEL
ALT: 62 U/L — ABNORMAL HIGH (ref 0–44)
AST: 39 U/L (ref 15–41)
Albumin: 2.5 g/dL — ABNORMAL LOW (ref 3.5–5.0)
Alkaline Phosphatase: 63 U/L (ref 38–126)
Anion gap: 6 (ref 5–15)
BUN: 37 mg/dL — ABNORMAL HIGH (ref 8–23)
CO2: 28 mmol/L (ref 22–32)
Calcium: 8.5 mg/dL — ABNORMAL LOW (ref 8.9–10.3)
Chloride: 104 mmol/L (ref 98–111)
Creatinine, Ser: 1.35 mg/dL — ABNORMAL HIGH (ref 0.61–1.24)
GFR, Estimated: 56 mL/min — ABNORMAL LOW (ref >60)
Glucose, Bld: 145 mg/dL — ABNORMAL HIGH (ref 70–99)
Potassium: 3.7 mmol/L (ref 3.5–5.1)
Sodium: 138 mmol/L (ref 135–145)
Total Bilirubin: 0.5 mg/dL (ref 0.3–1.2)
Total Protein: 5.7 g/dL — ABNORMAL LOW (ref 6.5–8.1)

## 2021-07-06 LAB — MAGNESIUM: Magnesium: 2.1 mg/dL (ref 1.7–2.4)

## 2021-07-06 LAB — PHOSPHORUS: Phosphorus: 2.4 mg/dL — ABNORMAL LOW (ref 2.5–4.6)

## 2021-07-06 MED ORDER — HALOPERIDOL LACTATE 5 MG/ML IJ SOLN
2.0000 mg | Freq: Once | INTRAMUSCULAR | Status: DC
  Filled 2021-07-06: qty 1

## 2021-07-06 MED ORDER — HALOPERIDOL LACTATE 5 MG/ML IJ SOLN: 2.0000 mg | Freq: Once | INTRAMUSCULAR | Status: DC

## 2021-07-06 MED ORDER — ALPRAZOLAM 0.5 MG PO TABS
1.5000 mg | ORAL_TABLET | Freq: Every day | ORAL | Status: DC
  Administered 2021-07-06: 1.5 mg via ORAL
  Filled 2021-07-06: qty 3

## 2021-07-06 MED ORDER — PREDNISONE 50 MG PO TABS
50.0000 mg | ORAL_TABLET | Freq: Every day | ORAL | Status: DC
  Administered 2021-07-07: 50 mg via ORAL
  Filled 2021-07-06: qty 1

## 2021-07-06 MED ORDER — HALOPERIDOL LACTATE 5 MG/ML IJ SOLN
2.0000 mg | Freq: Four times a day (QID) | INTRAMUSCULAR | Status: DC | PRN
  Administered 2021-07-07 (×2): 2 mg via INTRAVENOUS
  Filled 2021-07-06 (×2): qty 1

## 2021-07-06 MED ORDER — DRONABINOL 2.5 MG PO CAPS
5.0000 mg | ORAL_CAPSULE | Freq: Two times a day (BID) | ORAL | Status: DC
  Administered 2021-07-07 (×2): 5 mg via ORAL
  Filled 2021-07-06: qty 2

## 2021-07-06 NOTE — Progress Notes (Signed)
Pt calm and asleep. Family left and is very thankful.

## 2021-07-06 NOTE — Progress Notes (Signed)
0720 -Received pt sitting at the edge of the bed,  night RN and NT at bedside. Pt trying to stand up but very unsteady refused assistance does not want staff to go near him swinging telemetry box towards staff. Pt called 911, night RN was able to talk to 911 staff. Pt continues to be irritable and very agitated.  Having paranoia,delirium and hallucinations  been seeing things /people inside/outside the room. Refusing  all medications.  Daughter arrived in the unit trying to talk and calm pt to no avail. Per daughter pt has been taking Xanax 1.5mg  at home  for 56yrs ad we are just 0.25mg . Daughter started to get so frustrated  crying. Paged MD spoke with CN and daughter.  Haldol IV was ordered but pt still refusing  and will not allow RN or any staff to touch him.  Family requested for the Xanax dose to be given, ex wife tried to give it to the pt. And pt took it with out difficulty. Daughter got so frustrated and verbalized that she's going to take the pt home AMA. This RN and CN spoke to the daughter, advised to wait for the medicine to kick in.Daughter is agreeable. Will monitor accordingly.

## 2021-07-06 NOTE — Progress Notes (Signed)
PROGRESS NOTE    Wayne Williams  ZSW:[id number] DOB: 1951/03/07 DOA: 07/02/2021 PCP: Wayne Morale, MD     Brief Narrative:  70 y.o. WM PMHx CAD status post CABG, and recent non-STEMI and LCx stenting, chronic systolic CHF with LVEF 35 to 40%, HTN, COPD with chronic cough, severe malnutrition, HLD, BPH,   Presented with increasing weakness, unsteady gait and worsening of cough.   Patient was recently hospitalized for NSTEMI process 4 weeks ago.  For the last 2 to 3 months, patient has had worsening of cough, which family reported as his bronchitis which tend to flareup in the early summer, and has cover him with low-dose steroid for the last 47-month, which completed 2 weeks ago.  Despite, patient continued to have frequent cough, denies any cough or choking after eating, no increasing of cough in the morning.  No trouble swallowing and patient eat regular texture food.  Daughter also reported the patient has been very unsteady for the last 2 weeks, and had few episodes near fall.  Patient reported has had subjective fever at home no chills.  Cough has been dry most occasions.  And recently patient started Chantix.  This morning patient daughter went to patient's home found patient in respiratory distress and check his O2 saturation 88% on room air compared to baseline 91 to 93%.  And daughter called EMS.   At baseline, patient overall condition has deteriorated since last year, significantly decreased appetite, reduced to 1 meal a day, he denies any abdominal pain, no chronic diarrhea but complain about loss of appetite.  Family estimated he lost about 20 to 30 pounds compared to last year.  Patient denied any history of aspiration but reported that he has had severe acid reflux and used to take multiple antiacid medication including, TUMS but not taking them anymore.   ED Course: Ulceration stabilized on 2 L, still remain tachypneic.  Chest x-ray suspicious for bilateral lower lobe pneumonia left>  right.  WBC 12.3.  Creatinine 1.5, potassium 3.9.   Review of Systems: As per HPI otherwise 14 point review of systems negative.   Subjective: 7/9 drowsy but arousable, A/O x4, comfortable.  Follows all commands.   Assessment & Plan: Covid vaccination; vaccinated 2/3   Principal Problem:   Severe sepsis ([place]) Active Problems:   Essential hypertension   MYOCARDIAL INFARCTION, HX OF   COPD (chronic obstructive pulmonary disease) ([place])   Community acquired pneumonia   PNA (pneumonia)  Severe sepsis - On admission patient met criteria for severe sepsis HR> 90, RR> 20, WBC> 12 K, lactic acid> 2 - Most likely site of infection lungs - Trend procalcitonin/lactic acid Results for Wayne, Williams (MRN [id number]) as of 07/04/2021 16:03  Ref. Range 07/03/2021 13:20 07/03/2021 14:25 07/03/2021 17:25 07/03/2021 20:25 07/04/2021 03:48  Lactic Acid, Venous Latest Ref Range: 0.5 - 1.9 mmol/L  2.6 (HH) 1.8 3.3 (HH) 1.8  Procalcitonin Latest Units: ng/mL    0.95 0.79  - 7/9 DC Solu-Medrol: ----->Start Prednisone 50 mg daily  -DuoNeb QID -Flutter valve -Incentive spirometry -Normal saline 48ml/hr  Community acquired pneumonia -See severe sepsis  Acute respiratory failure with hypoxia -CAP versus aspiration pneumonia.  No history of aspiration as per patient and his family however, given the location of pneumonia appears to be bilateral on x-ray, and exam shows patient rash on his throat which appears chronic with concern about GERD (which he had but not on any treatment and not following with GI), and particularly he has  been on steroid for 2+ months recently without any antiacid measures. -Continue antibiotics ceftriaxone and azithromycin. -Speech evaluation. -7/9 Obtain ambulatory SPO2 place findings in appropriate epic note for insurance purposes.  Notify physician when complete   Acute COPD exacerbation -See severe sepsis  Chronic systolic CHF -Euvolemic, stable, no indication for decompensation  and monitor off diuresis for now.   HTN -Stable, continue metoprolol.   CAD -Continue aspirin, Plavix and Statin   BPH -Continue Flomax   Severe protein calorie malnutrition -About 30 lbs weight loss compared to Jan 2022.  Unintentional -With history of GERD, strongly recommend patient GI and consider endoscopy.  As outpatient.  Patient and daughter at bedside agreed. -Remove all diet restrictions, start regular diet. -7/9 Marinol 5 mg BID   Smoking cessation -Continue Chantix   Deconditioning -Consult dietitian, PT evaluation  Hypophosphatemia - Phosphorus goal> 2.5  Hyperglycemia - Secondary to steroid use - 7/8 sensitive SSI  Agitation/belligerence - 7/9 Per RN note patient belligerent and attacking nurses - Haldol 2 mg x 1 dose -7/9 spoke at length with Wayne Williams daughter who informed me that patient takes 1.5 mg Xanax (QHS) for sleep has been taking medication this way for at least 20 years.  NOTE patient had initially requested Xanax 0.5 mg q 4hrs which had been refused.  Wayne Williams his daughter agreed that this was inappropriate - 7/9 Wayne Williams daughter feels that the trazodone may be having a paradoxical reaction with her father.  Sleep - 7/9 DC Trazodone - 7/9 1.5 mg Xanax QHS for sleep          DVT prophylaxis: Lovenox Code Status: Full Family Communication: 7/9 spoke at length with Wayne Williams (daughter) discussed plan of care answered all questions Status is: Inpatient    Dispo: The patient is from: Home              Anticipated d/c is to: SNF              Anticipated d/c date is: 7/14              Patient currently unstable      Consultants:    Procedures/Significant Events:    I have personally reviewed and interpreted all radiology studies and my findings are as above.  VENTILATOR SETTINGS: Nasal cannula 7/9 Flow 3 L/min SPO2 99%   Cultures 7/6 respiratory virus panel pending 7/6 sputum culture pending  Antimicrobials: Anti-infectives  (From admission, onward)    Start     Ordered Stop   07/03/21 1100  cefTRIAXone (ROCEPHIN) 1 g in sodium chloride 0.9 % 100 mL IVPB  Status:  Discontinued        07/02/21 1349 07/02/21 1409   07/03/21 1100  cefTRIAXone (ROCEPHIN) 1 g in sodium chloride 0.9 % 100 mL IVPB        07/02/21 1409     07/03/21 1000  azithromycin (ZITHROMAX) tablet 500 mg        07/02/21 1350     07/02/21 1115  cefTRIAXone (ROCEPHIN) 1 g in sodium chloride 0.9 % 100 mL IVPB        07/02/21 1100 07/02/21 1244   07/02/21 1115  azithromycin (ZITHROMAX) 500 mg in sodium chloride 0.9 % 250 mL IVPB        07/02/21 1100 07/02/21 1244         Devices    LINES / TUBES:      Continuous Infusions:  cefTRIAXone (ROCEPHIN)  IV 1 g (07/05/21 1200)  Objective: Vitals:   07/05/21 1936 07/05/21 2008 07/06/21 0417 07/06/21 0423  BP: 128/78   132/67  Pulse: 96   (!) 103  Resp: 20   20  Temp: 98.4 F (36.9 C)   (!) 97.5 F (36.4 C)  TempSrc: Oral   Oral  SpO2: 100% 98%  100%  Weight:   57.3 kg   Height:        Intake/Output Summary (Last 24 hours) at 07/06/2021 0819 Last data filed at 07/06/2021 0230 Gross per 24 hour  Intake 592 ml  Output 1850 ml  Net -1258 ml    Filed Weights   07/04/21 0417 07/05/21 0442 07/06/21 0417  Weight: 57.8 kg 58.2 kg 57.3 kg    Examination:  General: Drowsy but arousable, A/O x4, follows all commands, positive acute respiratory distress, cachectic Eyes: negative scleral hemorrhage, negative anisocoria, negative icterus ENT: Negative Runny nose, negative gingival bleeding, Neck:  Negative scars, masses, torticollis, lymphadenopathy, JVD Lungs: clear to auscultation bilaterally without wheezes, rhonchi or crackles Cardiovascular: Regular rate and rhythm without murmur gallop or rub normal S1 and S2 Abdomen: negative abdominal pain, nondistended, positive soft, bowel sounds, no rebound, no ascites, no appreciable mass Extremities: No significant cyanosis, clubbing, or  edema bilateral lower extremities Skin: Negative rashes, lesions, ulcers Psychiatric:  Negative depression, negative anxiety, negative fatigue, negative mania  Central nervous system:  Cranial nerves II through XII intact, tongue/uvula midline, all extremities muscle strength 5/5, sensation intact throughout, finger nose finger bilateral within normal limits, quick finger touch bilateral within normal limits, negative dysarthria, negative expressive aphasia, negative receptive aphasia. .     Data Reviewed: Care during the described time interval was provided by me .  I have reviewed this patient's available data, including medical history, events of note, physical examination, and all test results as part of my evaluation.  CBC: Recent Labs  Lab 07/02/21 1115 07/03/21 0150 07/04/21 0348 07/05/21 0528 07/06/21 0141  WBC 12.3* 12.9* 13.3* 14.1* 12.8*  NEUTROABS 10.4* 11.8* 12.6* 13.4* 11.8*  HGB 10.7* 10.6* 9.5* 9.2* 9.5*  HCT 33.2* 34.1* 28.9* 27.7* 28.5*  MCV 97.6 99.1 95.4 94.2 93.1  PLT 392 381 400 363 426*    Basic Metabolic Panel: Recent Labs  Lab 07/02/21 1115 07/03/21 0150 07/04/21 0348 07/05/21 0528 07/06/21 0141  NA 134* 137 136 136 138  K 3.9 4.1 4.3 3.9 3.7  CL 100 104 102 102 104  CO2 $Re'22 23 29 24 28  'sOP$ GLUCOSE 119* 165* 282* 292* 145*  BUN 25* 24* 40* 36* 37*  CREATININE 1.53* 1.37* 1.43* 1.45* 1.35*  CALCIUM 8.5* 8.3* 8.3* 8.2* 8.5*  MG  --   --  2.2 1.9 2.1  PHOS  --   --  1.9* 3.3 2.4*    GFR: Estimated Creatinine Clearance: 40.1 mL/min (A) (by C-G formula based on SCr of 1.35 mg/dL (H)). Liver Function Tests: Recent Labs  Lab 07/02/21 1115 07/04/21 0348 07/05/21 0528 07/06/21 0141  AST 23 67* 45* 39  ALT 35 51* 57* 62*  ALKPHOS 55 72 60 63  BILITOT 1.6* 0.6 0.5 0.5  PROT 6.0* 5.5* 5.4* 5.7*  ALBUMIN 2.6* 2.2* 2.3* 2.5*    No results for input(s): LIPASE, AMYLASE in the last 168 hours. No results for input(s): AMMONIA in the last 168  hours. Coagulation Profile: No results for input(s): INR, PROTIME in the last 168 hours. Cardiac Enzymes: No results for input(s): CKTOTAL, CKMB, CKMBINDEX, TROPONINI in the last 168 hours. BNP (last 3  results) No results for input(s): PROBNP in the last 8760 hours. HbA1C: No results for input(s): HGBA1C in the last 72 hours. CBG: Recent Labs  Lab 07/05/21 1936 07/05/21 2315 07/06/21 0359  GLUCAP 458* 195* 229*   Lipid Profile: No results for input(s): CHOL, HDL, LDLCALC, TRIG, CHOLHDL, LDLDIRECT in the last 72 hours. Thyroid Function Tests: No results for input(s): TSH, T4TOTAL, FREET4, T3FREE, THYROIDAB in the last 72 hours. Anemia Panel: No results for input(s): VITAMINB12, FOLATE, FERRITIN, TIBC, IRON, RETICCTPCT in the last 72 hours.  Sepsis Labs: Recent Labs  Lab 07/03/21 1425 07/03/21 1725 07/03/21 2025 07/04/21 0348 07/05/21 0528  PROCALCITON  --   --  0.95 0.79 0.44  LATICACIDVEN 2.6* 1.8 3.3* 1.8  --      Recent Results (from the past 240 hour(s))  Resp Panel by RT-PCR (Flu A&B, Covid) Nasopharyngeal Swab     Status: None   Collection Time: 07/02/21 10:21 AM   Specimen: Nasopharyngeal Swab; Nasopharyngeal(NP) swabs in vial transport medium  Result Value Ref Range Status   SARS Coronavirus 2 by RT PCR NEGATIVE NEGATIVE Final    Comment: (NOTE) SARS-CoV-2 target nucleic acids are NOT DETECTED.  The SARS-CoV-2 RNA is generally detectable in upper respiratory specimens during the acute phase of infection. The lowest concentration of SARS-CoV-2 viral copies this assay can detect is 138 copies/mL. A negative result does not preclude SARS-Cov-2 infection and should not be used as the sole basis for treatment or other patient management decisions. A negative result may occur with  improper specimen collection/handling, submission of specimen other than nasopharyngeal swab, presence of viral mutation(s) within the areas targeted by this assay, and inadequate  number of viral copies(<138 copies/mL). A negative result must be combined with clinical observations, patient history, and epidemiological information. The expected result is Negative.  Fact Sheet for Patients:  EntrepreneurPulse.com.au  Fact Sheet for Healthcare Providers:  IncredibleEmployment.be  This test is no t yet approved or cleared by the [place] FDA and  has been authorized for detection and/or diagnosis of SARS-CoV-2 by FDA under an Emergency Use Authorization (EUA). This EUA will remain  in effect (meaning this test can be used) for the duration of the COVID-19 declaration under Section 564(b)(1) of the Act, 21 U.S.C.section 360bbb-3(b)(1), unless the authorization is terminated  or revoked sooner.       Influenza A by PCR NEGATIVE NEGATIVE Final   Influenza B by PCR NEGATIVE NEGATIVE Final    Comment: (NOTE) The Xpert Xpress SARS-CoV-2/FLU/RSV plus assay is intended as an aid in the diagnosis of influenza from Nasopharyngeal swab specimens and should not be used as a sole basis for treatment. Nasal washings and aspirates are unacceptable for Xpert Xpress SARS-CoV-2/FLU/RSV testing.  Fact Sheet for Patients: EntrepreneurPulse.com.au  Fact Sheet for Healthcare Providers: IncredibleEmployment.be  This test is not yet approved or cleared by the [place] FDA and has been authorized for detection and/or diagnosis of SARS-CoV-2 by FDA under an Emergency Use Authorization (EUA). This EUA will remain in effect (meaning this test can be used) for the duration of the COVID-19 declaration under Section 564(b)(1) of the Act, 21 U.S.C. section 360bbb-3(b)(1), unless the authorization is terminated or revoked.  Performed at [facility], [address], [place], [place] [address]   Culture, blood (routine x 2)     Status: None (Preliminary result)   Collection Time: 07/02/21 11:00 AM    Specimen: BLOOD  Result Value Ref Range Status   Specimen Description  BLOOD LEFT ANTECUBITAL  Final   Special Requests   Final    BOTTLES DRAWN AEROBIC AND ANAEROBIC Blood Culture adequate volume   Culture   Final    NO GROWTH 3 DAYS Performed at [facility], [address], [place], [place] [address]    Report Status PENDING  Incomplete  Culture, blood (routine x 2)     Status: None (Preliminary result)   Collection Time: 07/02/21 11:04 AM   Specimen: BLOOD LEFT FOREARM  Result Value Ref Range Status   Specimen Description BLOOD LEFT FOREARM  Final   Special Requests   Final    BOTTLES DRAWN AEROBIC AND ANAEROBIC Blood Culture adequate volume   Culture   Final    NO GROWTH 3 DAYS Performed at [facility], [place] [address] [place], [place] [address]    Report Status PENDING  Incomplete  Urine culture     Status: None   Collection Time: 07/02/21  1:54 PM   Specimen: Urine, Clean Catch  Result Value Ref Range Status   Specimen Description URINE, CLEAN CATCH  Final   Special Requests NONE  Final   Culture   Final    NO GROWTH Performed at [facility], [place] [address] [place], [place] [address]    Report Status 07/03/2021 FINAL  Final          Radiology Studies: No results found.      Scheduled Meds:  aspirin EC  81 mg Oral QHS   azithromycin  500 mg Oral Daily   benzonatate  100 mg Oral BID   clopidogrel  75 mg Oral Daily   enoxaparin (LOVENOX) injection  40 mg Subcutaneous Q24H   feeding supplement (NEPRO CARB STEADY)  237 mL Oral TID BM   fluticasone furoate-vilanterol  1 puff Inhalation Q1500   guaiFENesin  1,200 mg Oral BID   haloperidol lactate  2 mg Intravenous Once   insulin aspart  0-9 Units Subcutaneous Q4H   ipratropium-albuterol  3 mL Nebulization QID   methylPREDNISolone (SOLU-MEDROL) injection  60 mg Intravenous Q12H   metoprolol succinate  50 mg Oral Daily   multivitamin with minerals  1 tablet Oral Daily   pantoprazole   20 mg Oral Daily   rosuvastatin  20 mg Oral Daily   tamsulosin  0.4 mg Oral Daily   varenicline  1 mg Oral BID   Continuous Infusions:  cefTRIAXone (ROCEPHIN)  IV 1 g (07/05/21 1200)     LOS: 4 days    Time spent:40 min    Nicasio Barlowe, Geraldo Docker, MD [facility]   If 7PM-7AM, please contact night-coverage 07/06/2021, 8:19 AM

## 2021-07-06 NOTE — Progress Notes (Signed)
Pt  confused and irritable, getting out of bed, wants to go home, pt taught that he's riding an airplane, tried to reorient the pt but was not successful. Pt refused to take some of his night medications. Dr. Hal Hope on call for hospitalist made aware and ordered haldol 2mg  IV however pt refused to take it but instead agreed to take xanax 0.25mg , med was given without difficulty. Will continue to monitor.

## 2021-07-06 NOTE — Progress Notes (Signed)
Pt woke up this morning confused, agitated and will not follow commands, pt called 911 and told the operator he needs help, talked to the operator and informed her that patient is in the hospital. Daughter  [name] called and told this RN that this is pt's baseline when in the hospital. Daughter on her way. Patient offered xanax but refused to take.

## 2021-07-06 NOTE — Progress Notes (Signed)
Pt pulled off his tele box and he's been holding it, does not want to let go of the tele box. He does not trust anybody.  He then stood up and suddenly  swing the box towards the nurses. Pt was unsteady, but he  continuously swaying the tele box. Security was called but then   Daughter  arrives and they calm pt down.

## 2021-07-07 DIAGNOSIS — A419 Sepsis, unspecified organism: Secondary | ICD-10-CM | POA: Diagnosis not present

## 2021-07-07 DIAGNOSIS — I10 Essential (primary) hypertension: Secondary | ICD-10-CM | POA: Diagnosis not present

## 2021-07-07 DIAGNOSIS — J431 Panlobular emphysema: Secondary | ICD-10-CM | POA: Diagnosis not present

## 2021-07-07 DIAGNOSIS — J189 Pneumonia, unspecified organism: Secondary | ICD-10-CM | POA: Diagnosis not present

## 2021-07-07 LAB — GLUCOSE, CAPILLARY
Glucose-Capillary: 160 mg/dL — ABNORMAL HIGH (ref 70–99)
Glucose-Capillary: 132 mg/dL — ABNORMAL HIGH (ref 70–99)
Glucose-Capillary: 142 mg/dL — ABNORMAL HIGH (ref 70–99)
Glucose-Capillary: 334 mg/dL — ABNORMAL HIGH (ref 70–99)

## 2021-07-07 LAB — CBC WITH DIFFERENTIAL/PLATELET
Abs Immature Granulocytes: 0.1 10*3/uL — ABNORMAL HIGH (ref 0.00–0.07)
Basophils Absolute: 0 10*3/uL (ref 0.0–0.1)
Basophils Relative: 0 %
Eosinophils Absolute: 0 10*3/uL (ref 0.0–0.5)
Eosinophils Relative: 0 %
HCT: 32.8 % — ABNORMAL LOW (ref 39.0–52.0)
Hemoglobin: 10.7 g/dL — ABNORMAL LOW (ref 13.0–17.0)
Immature Granulocytes: 1 %
Lymphocytes Relative: 13 %
Lymphs Abs: 1.4 10*3/uL (ref 0.7–4.0)
MCH: 31.3 pg (ref 26.0–34.0)
MCHC: 32.6 g/dL (ref 30.0–36.0)
MCV: 95.9 fL (ref 80.0–100.0)
Monocytes Absolute: 0.9 10*3/uL (ref 0.1–1.0)
Monocytes Relative: 8 %
Neutro Abs: 9 10*3/uL — ABNORMAL HIGH (ref 1.7–7.7)
Neutrophils Relative %: 78 %
Platelets: 463 10*3/uL — ABNORMAL HIGH (ref 150–400)
RBC: 3.42 MIL/uL — ABNORMAL LOW (ref 4.22–5.81)
RDW: 13.4 % (ref 11.5–15.5)
WBC: 11.5 10*3/uL — ABNORMAL HIGH (ref 4.0–10.5)
nRBC: 0 % (ref 0.0–0.2)

## 2021-07-07 LAB — COMPREHENSIVE METABOLIC PANEL
ALT: 59 U/L — ABNORMAL HIGH (ref 0–44)
AST: 39 U/L (ref 15–41)
Albumin: 2.6 g/dL — ABNORMAL LOW (ref 3.5–5.0)
Alkaline Phosphatase: 59 U/L (ref 38–126)
Anion gap: 8 (ref 5–15)
BUN: 34 mg/dL — ABNORMAL HIGH (ref 8–23)
CO2: 32 mmol/L (ref 22–32)
Calcium: 8.7 mg/dL — ABNORMAL LOW (ref 8.9–10.3)
Chloride: 101 mmol/L (ref 98–111)
Creatinine, Ser: 1.37 mg/dL — ABNORMAL HIGH (ref 0.61–1.24)
GFR, Estimated: 55 mL/min — ABNORMAL LOW (ref >60)
Glucose, Bld: 154 mg/dL — ABNORMAL HIGH (ref 70–99)
Potassium: 4 mmol/L (ref 3.5–5.1)
Sodium: 141 mmol/L (ref 135–145)
Total Bilirubin: 0.5 mg/dL (ref 0.3–1.2)
Total Protein: 5.6 g/dL — ABNORMAL LOW (ref 6.5–8.1)

## 2021-07-07 LAB — CULTURE, BLOOD (ROUTINE X 2)
Culture: NO GROWTH
Culture: NO GROWTH
Special Requests: ADEQUATE
Special Requests: ADEQUATE

## 2021-07-07 LAB — PHOSPHORUS: Phosphorus: 3.1 mg/dL (ref 2.5–4.6)

## 2021-07-07 LAB — MAGNESIUM: Magnesium: 2.2 mg/dL (ref 1.7–2.4)

## 2021-07-07 NOTE — Progress Notes (Signed)
Patient agitated and irritable throughout the am.  Stated wanted to see the MD and go home.  Daughter had called me earlier in the shift and spoken re:  potential discharge in the near future.  Pt required IV Haldol and po Xanax before calming down.  Refer to [facility].  Pt continues to require assist to ambulate to BR with FWRW.  Also requires Oxygen as becomes  dyspneic quite easily.  Is a chronic smoker, which has caused concern with the daughter.  Pt also requires extensive assist with ADLs due to weakness and instability with ambulation.  Despite the aforementioned, patient is adamant re:  desire to go home.  Daughter in to visit with patient and to speak with MD.  Discussion included potential use of palliative care services in order to provide more of a comfort level of patient in the home setting.  When presented with the options as stated, patient refused all assist, and stated he wanted to leave AMA.  Daughter appeared willing to oblige his wishes and decided to take him home.  Pt left AMA with daughter and son-in-law via private vehicle to own residence. At [place]

## 2021-07-08 ENCOUNTER — Telehealth: Payer: Self-pay | Admitting: Family Medicine

## 2021-07-08 ENCOUNTER — Telehealth: Payer: Self-pay

## 2021-07-08 NOTE — Telephone Encounter (Signed)
My first advice is he should go back to the ER so he can be readmitted for the care he needs. If he refuses this, the next best thing is for him to come in to see me tomorrow as an OV (30 minutes) so I can assess him. He likely does need oxygen but we need to document this properly before insurance will pay for it. I cannot order it until I see him.

## 2021-07-08 NOTE — Telephone Encounter (Signed)
Wayne Williams (daughter) called to let Dr. Sarajane Jews know that the patient has been in the hospital and he went AMA last night and signed himself out of the hospital.  The patient is struggling with oxygen and she wants to see if there is a way for her to pick up a Rx for oxygen and pick it up at the medical supply store or if Dr. Sarajane Jews can have it ordered to his house.  The patient left the hospital without oxygen or oxygen orders.  Please advise

## 2021-07-08 NOTE — Telephone Encounter (Signed)
Spoke with pt per Dr Sarajane Jews, advised to go back to the ER for proper care or come to the Office tomorrow so Dr Sarajane Jews can assess him so he can get orders for O2 due to insurance coverage, pt denied coming to the office but stated that he will call 911 to transport him back to the ER to get readmitted per Dr Sarajane Jews.

## 2021-07-08 NOTE — Discharge Summary (Signed)
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 07/07/21 0721 07/07/21 1123 07/07/21 1556  GLUCAP 203* 160* 132* 142* 334*       Signed:  Dia Crawford, MD [facility]

## 2021-07-08 NOTE — Telephone Encounter (Signed)
Transition Care Management Unsuccessful Follow-up Telephone Call  Date of discharge and from where:  07/07/2021  [facility]   Attempts:  1st Attempt  Reason for unsuccessful TCM follow-up call:  No answer/busy  Patient Left AMA from Hospital see discharge note .

## 2021-07-08 NOTE — Telephone Encounter (Signed)
Ok to order Oxygen for pt

## 2021-07-16 ENCOUNTER — Other Ambulatory Visit: Payer: Self-pay | Admitting: Family Medicine

## 2021-07-21 ENCOUNTER — Other Ambulatory Visit: Payer: Self-pay | Admitting: Family Medicine

## 2021-07-23 DIAGNOSIS — Z743 Need for continuous supervision: Secondary | ICD-10-CM | POA: Diagnosis not present

## 2021-07-24 ENCOUNTER — Telehealth: Payer: Self-pay | Admitting: Family Medicine

## 2021-07-24 NOTE — Telephone Encounter (Signed)
Left message for patient to call back and schedule Medicare Annual Wellness Visit (AWV) either virtually or in office.   Last AWVi 08/01/20 ; please schedule at anytime with [facility] Nurse Health Advisor 1 or 2   This should be a 45 minute visit.

## 2021-07-29 DIAGNOSIS — 419620001 Death: Secondary | SNOMED CT | POA: Diagnosis not present

## 2021-07-29 DEATH — deceased

## 2021-08-23 ENCOUNTER — Telehealth: Payer: Self-pay | Admitting: Family Medicine

## 2021-08-23 NOTE — Telephone Encounter (Signed)
Tried calling patient to schedule Medicare Annual Wellness Visit (AWV) either virtually or in office.   Phone would not connect  Last AWV 08/01/20  please schedule at anytime with [facility] 1 or 2   This should be a 45 minute visit.

## 2021-09-23 ENCOUNTER — Telehealth: Payer: Self-pay | Admitting: Family Medicine

## 2021-09-23 NOTE — Telephone Encounter (Signed)
Left message for patient to call back and schedule Medicare Annual Wellness Visit (AWV) either virtually or in office. Left  my [facility] number [phone]   Last AWV ;08/01/20 please schedule at anytime with [facility] Nurse Health Advisor 1 or 2   This should be a 45 minute visit.

## 2021-12-04 ENCOUNTER — Telehealth: Payer: Self-pay | Admitting: Family Medicine

## 2021-12-04 NOTE — Telephone Encounter (Signed)
Tried calling patient to schedule Medicare Annual Wellness Visit (AWV) either virtually or in office.  No answer Last AWV 08/01/20 please schedule at anytime with [facility] Nurse Health Advisor 1 or 2   This should be a 45 minute visit.

## 2022-10-06 IMAGING — DX DG CHEST 1V PORT
2 series · 2 of 2 positions shown · non-contrast
Comparison: 01/18/2021

CLINICAL DATA: Dyspnea

EXAM:
PORTABLE CHEST 1 VIEW

[chest ap (1 of 2)]
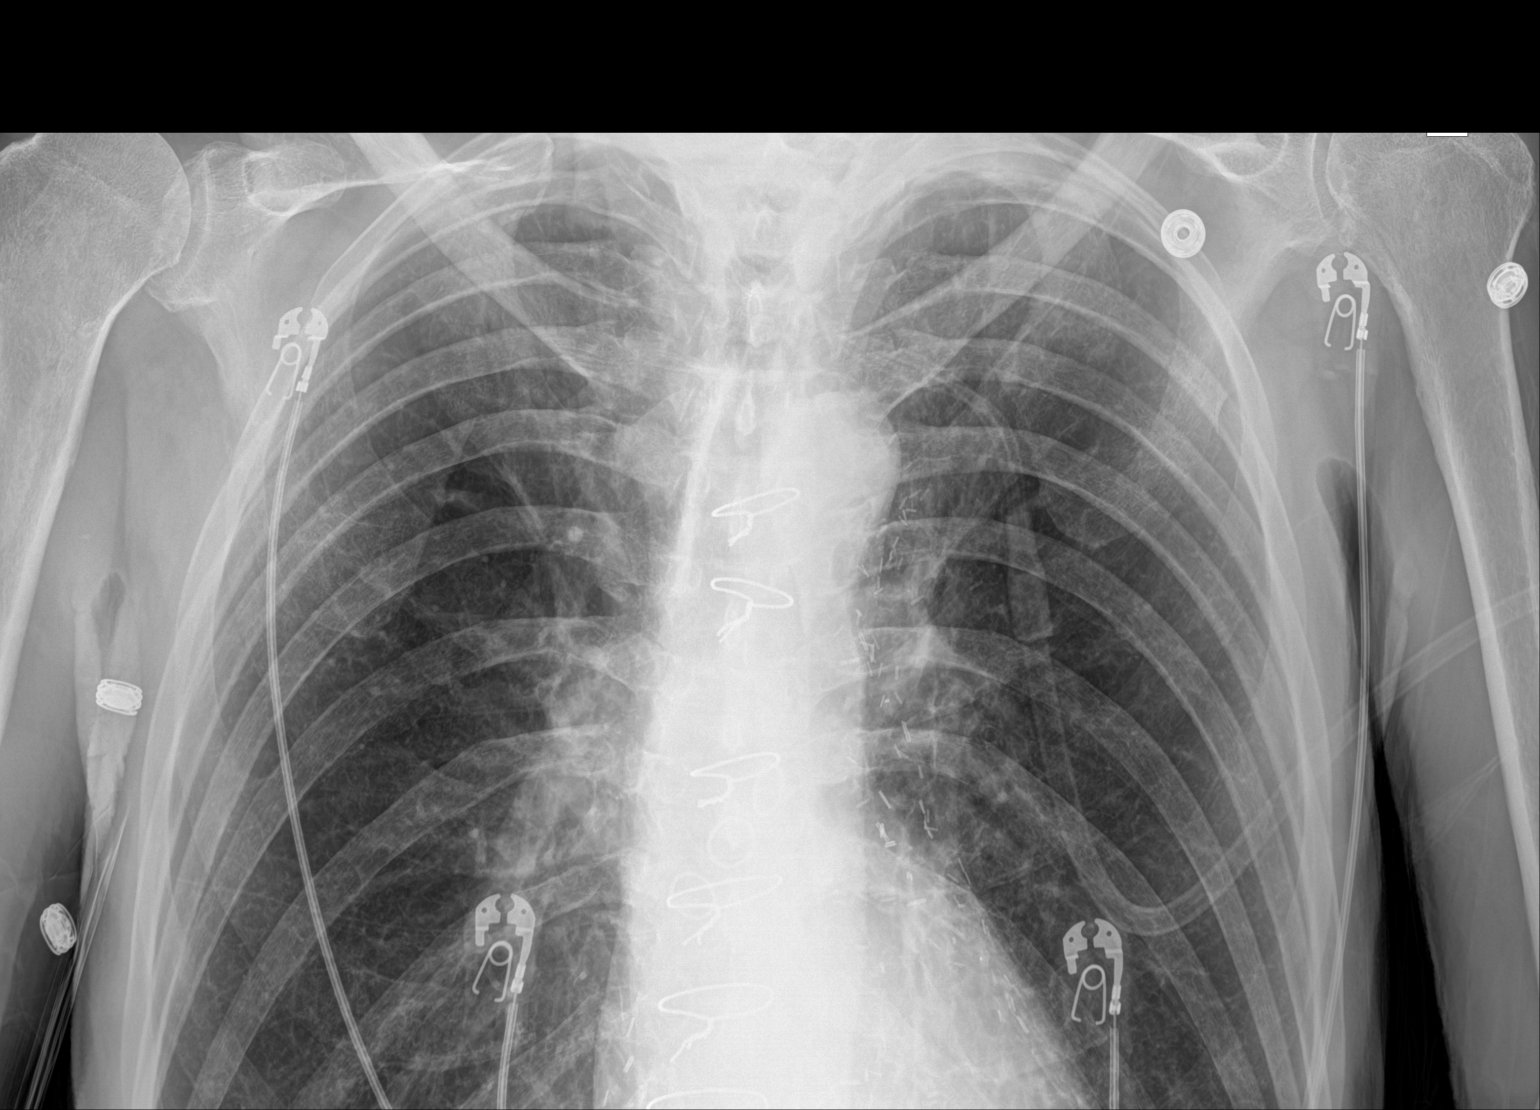

[chest ap (2 of 2)]
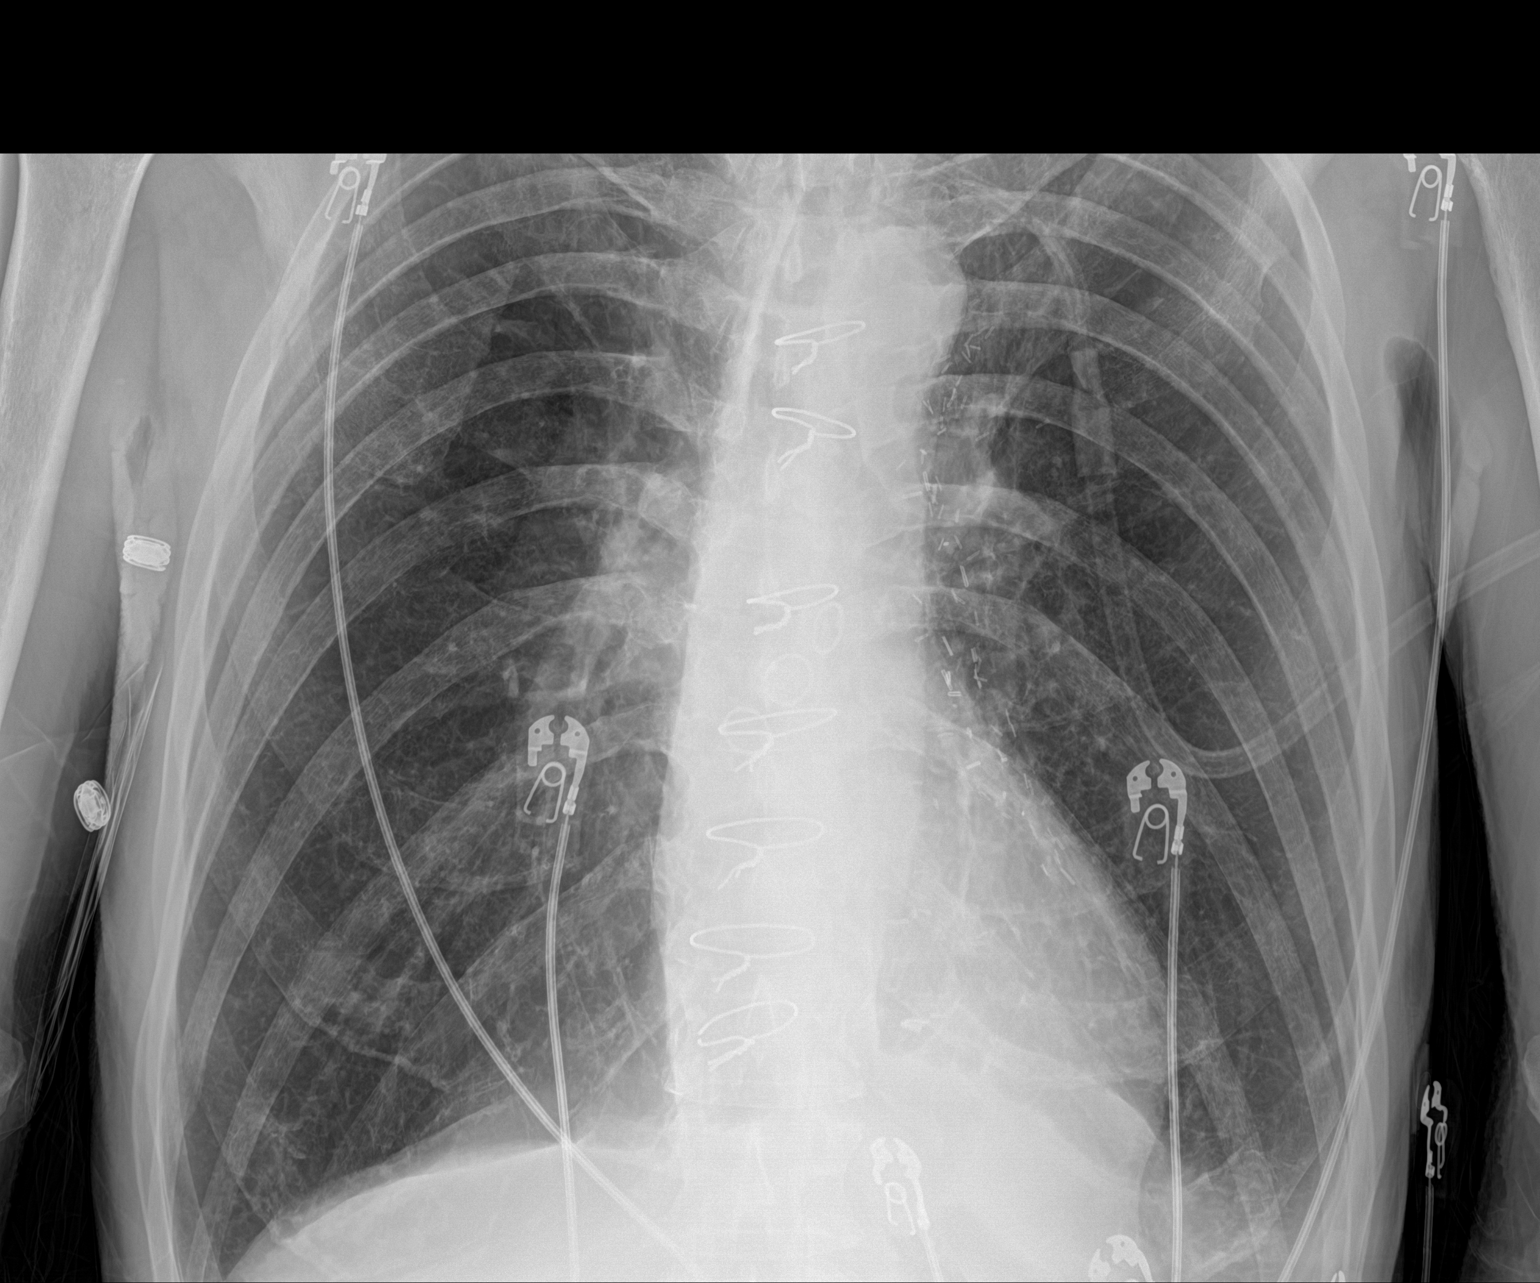

[2 of 2 positions shown; findings below may reference images not displayed]

FINDINGS: The lungs are symmetrically hyperinflated in keeping with changes of
underlying COPD. No superimposed focal pulmonary nodule or
infiltrate. No pneumothorax or pleural effusion. Coronary artery
bypass grafting has been performed. Cardiac size within normal
limits. Pulmonary vascularity is normal. No acute bone abnormality.
IMPRESSION: No active disease.  COPD.

## 2023-01-07 IMAGING — DX DG CHEST 1V PORT
1 series · 1 of 1 positions shown · non-contrast
Comparison: Most recent radiograph 03/02/2021.

CLINICAL DATA: Chest pain.  COPD exacerbation.

EXAM:
PORTABLE CHEST 1 VIEW

[chest ap]
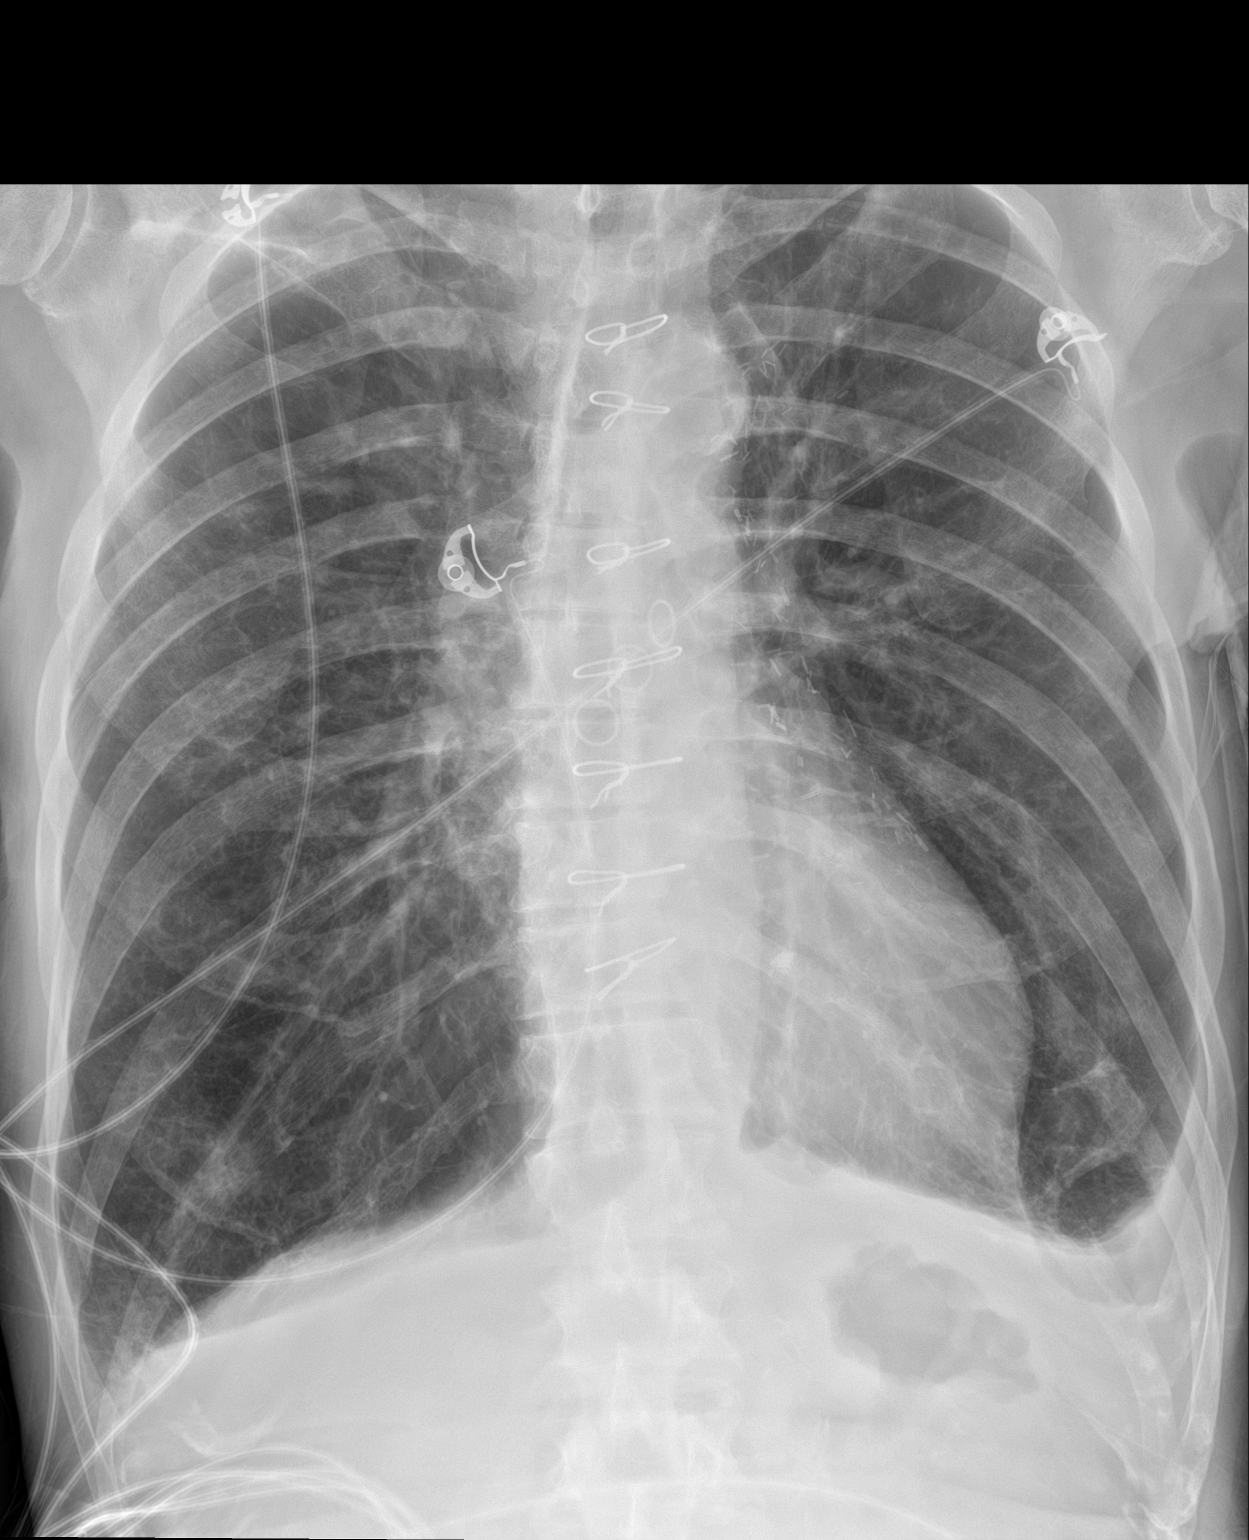

[1 of 1 positions shown; findings below may reference images not displayed]

FINDINGS: Post median sternotomy and CABG. Normal heart size. Aortic
atherosclerosis. Chronic hyperinflation and bronchial thickening.
Blunting of the left greater than right costophrenic angle, similar
to prior exam. Minimal ill-defined opacity at the right lung base.
No pneumothorax. No pulmonary edema. No acute osseous abnormalities
are seen.
IMPRESSION: 1. Minimal ill-defined opacity at the right lung base, atelectasis
versus pneumonia.
2. Chronic hyperinflation and bronchial thickening, imaging findings
consistent with COPD.
3. Blunting of the costophrenic angles appear similar, may be due to
hyperinflation versus small effusions.
